# Patient Record
Sex: Female | Born: 1945 | Race: White | Hispanic: No | Smoking: Current every day smoker
Health system: Southern US, Community
[De-identification: ages and names within clinical notes are randomized; demographics above are authoritative.]

## PROBLEM LIST (undated history)

## (undated) DIAGNOSIS — R112 Nausea with vomiting, unspecified: Secondary | ICD-10-CM

## (undated) DIAGNOSIS — T4145XA Adverse effect of unspecified anesthetic, initial encounter: Secondary | ICD-10-CM

## (undated) DIAGNOSIS — F32A Depression, unspecified: Secondary | ICD-10-CM

## (undated) DIAGNOSIS — E66811 Obesity, class 1: Secondary | ICD-10-CM

## (undated) DIAGNOSIS — I2510 Atherosclerotic heart disease of native coronary artery without angina pectoris: Secondary | ICD-10-CM

## (undated) DIAGNOSIS — E119 Type 2 diabetes mellitus without complications: Secondary | ICD-10-CM

## (undated) DIAGNOSIS — R519 Headache, unspecified: Secondary | ICD-10-CM

## (undated) DIAGNOSIS — R51 Headache: Secondary | ICD-10-CM

## (undated) DIAGNOSIS — I10 Essential (primary) hypertension: Secondary | ICD-10-CM

## (undated) DIAGNOSIS — E039 Hypothyroidism, unspecified: Secondary | ICD-10-CM

## (undated) DIAGNOSIS — Z9889 Other specified postprocedural states: Secondary | ICD-10-CM

## (undated) DIAGNOSIS — F419 Anxiety disorder, unspecified: Secondary | ICD-10-CM

## (undated) DIAGNOSIS — T8859XA Other complications of anesthesia, initial encounter: Secondary | ICD-10-CM

## (undated) DIAGNOSIS — F329 Major depressive disorder, single episode, unspecified: Secondary | ICD-10-CM

## (undated) DIAGNOSIS — I739 Peripheral vascular disease, unspecified: Secondary | ICD-10-CM

## (undated) DIAGNOSIS — Z720 Tobacco use: Secondary | ICD-10-CM

## (undated) DIAGNOSIS — E785 Hyperlipidemia, unspecified: Secondary | ICD-10-CM

## (undated) DIAGNOSIS — E669 Obesity, unspecified: Secondary | ICD-10-CM

## (undated) HISTORY — PX: CHOLECYSTECTOMY OPEN: SUR202

## (undated) HISTORY — DX: Obesity, class 1: E66.811

## (undated) HISTORY — DX: Type 2 diabetes mellitus without complications: E11.9

## (undated) HISTORY — DX: Obesity, unspecified: E66.9

## (undated) HISTORY — DX: Hyperlipidemia, unspecified: E78.5

## (undated) HISTORY — DX: Tobacco use: Z72.0

## (undated) HISTORY — DX: Hypothyroidism, unspecified: E03.9

## (undated) HISTORY — DX: Peripheral vascular disease, unspecified: I73.9

## (undated) HISTORY — DX: Atherosclerotic heart disease of native coronary artery without angina pectoris: I25.10

## (undated) HISTORY — PX: DILATION AND CURETTAGE OF UTERUS: SHX78

## (undated) HISTORY — DX: Essential (primary) hypertension: I10

## (undated) HISTORY — PX: TONSILLECTOMY: SUR1361

---

## 2005-10-20 DIAGNOSIS — I2510 Atherosclerotic heart disease of native coronary artery without angina pectoris: Secondary | ICD-10-CM | POA: Insufficient documentation

## 2006-03-06 HISTORY — PX: CARDIAC CATHETERIZATION: SHX172

## 2008-03-10 ENCOUNTER — Encounter: Admission: RE | Admit: 2008-03-10 | Discharge: 2008-03-10 | Payer: Self-pay | Admitting: Obstetrics and Gynecology

## 2009-02-18 HISTORY — PX: OTHER SURGICAL HISTORY: SHX169

## 2012-01-20 HISTORY — PX: OTHER SURGICAL HISTORY: SHX169

## 2012-10-17 ENCOUNTER — Ambulatory Visit: Payer: Self-pay | Admitting: Cardiology

## 2012-10-17 ENCOUNTER — Ambulatory Visit (INDEPENDENT_AMBULATORY_CARE_PROVIDER_SITE_OTHER): Payer: BC Managed Care – PPO | Admitting: Cardiology

## 2012-10-17 ENCOUNTER — Encounter: Payer: Self-pay | Admitting: Cardiology

## 2012-10-17 VITALS — BP 114/64 | HR 63 | Ht 62.0 in | Wt 177.0 lb

## 2012-10-17 DIAGNOSIS — I2510 Atherosclerotic heart disease of native coronary artery without angina pectoris: Secondary | ICD-10-CM

## 2012-10-17 DIAGNOSIS — I739 Peripheral vascular disease, unspecified: Secondary | ICD-10-CM

## 2012-10-17 DIAGNOSIS — Z720 Tobacco use: Secondary | ICD-10-CM

## 2012-10-17 DIAGNOSIS — E66811 Obesity, class 1: Secondary | ICD-10-CM

## 2012-10-17 DIAGNOSIS — R0989 Other specified symptoms and signs involving the circulatory and respiratory systems: Secondary | ICD-10-CM

## 2012-10-17 DIAGNOSIS — F17200 Nicotine dependence, unspecified, uncomplicated: Secondary | ICD-10-CM

## 2012-10-17 DIAGNOSIS — I10 Essential (primary) hypertension: Secondary | ICD-10-CM

## 2012-10-17 DIAGNOSIS — E669 Obesity, unspecified: Secondary | ICD-10-CM

## 2012-10-17 DIAGNOSIS — E785 Hyperlipidemia, unspecified: Secondary | ICD-10-CM

## 2012-10-17 NOTE — Patient Instructions (Addendum)
Your physician has requested that you have a lower  extremity arterial duplex. This test is an ultrasound of the arteries in the legs . It looks at arterial blood flow in the legs. Allow one hour for Lower  Arterial scans. There are no restrictions or special instructions  Your physician has requested that you have a carotid duplex. This test is an ultrasound of the carotid arteries in your neck. It looks at blood flow through these arteries that supply the brain with blood. Allow one hour for this exam. There are no restrictions or special instructions.  Your physician wants you to follow-up in 6 months Dr Herbie Baltimore.  You will receive a reminder letter in the mail two months in advance. If you don't receive a letter, please call our office to schedule the follow-up appointment.

## 2012-10-20 ENCOUNTER — Encounter: Payer: Self-pay | Admitting: Cardiology

## 2012-10-20 DIAGNOSIS — E785 Hyperlipidemia, unspecified: Secondary | ICD-10-CM | POA: Insufficient documentation

## 2012-10-20 DIAGNOSIS — E669 Obesity, unspecified: Secondary | ICD-10-CM | POA: Insufficient documentation

## 2012-10-20 DIAGNOSIS — R0989 Other specified symptoms and signs involving the circulatory and respiratory systems: Secondary | ICD-10-CM | POA: Insufficient documentation

## 2012-10-20 DIAGNOSIS — I10 Essential (primary) hypertension: Secondary | ICD-10-CM | POA: Insufficient documentation

## 2012-10-20 DIAGNOSIS — Z716 Tobacco abuse counseling: Secondary | ICD-10-CM | POA: Insufficient documentation

## 2012-10-20 DIAGNOSIS — I739 Peripheral vascular disease, unspecified: Secondary | ICD-10-CM | POA: Insufficient documentation

## 2012-10-20 NOTE — Assessment & Plan Note (Addendum)
I talked to her again about finally quitting smoking for about 4 to 5 minutes.  I told her that she can try Nicoderm patches, Nicorette gum, or even the E- cigarettes.  I told her that it really isn't the nicotine that is my concern for now, it is the burning chemicals.  She laughed.  I am not sure I she will try or not.

## 2012-10-20 NOTE — Assessment & Plan Note (Signed)
Simply because of her coronary disease in lower extremity arterial disease, affected there may be a bruit there warrants evaluation for carotid Dopplers.  Plan: Carotid Dopplers to be performed at time of LEA Dopplers.

## 2012-10-20 NOTE — Assessment & Plan Note (Signed)
She is known bilateral extremity PAD. It is occlusive, and she does have claudication but is not lifestyle limiting. She has able to walk through it. She probably is pretty good collateralization. No signs of critical limb ischemia  Plan: Continue aggressive risk factor modification including continued smoking cessation counseling, diabetes, blood pressure and lipid control. Monitor for signs of nonhealing wounds or lesions. Repeat LEA Dopplers/ABI in November -- review the results with Dr. Nanetta Batty to determine if the ABI levels and the velocities are significant enough to warrant invasive evaluation despite progressively worsening symptoms.

## 2012-10-20 NOTE — Assessment & Plan Note (Signed)
I counseled her on the importance of trying to get back out and getting exercise. She is to motivate her soap to do this. She also needs to watch her dietary intake and ensure that she decrease her carbohydrate and fatty food intake while increasing fruits and vegetables.

## 2012-10-20 NOTE — Progress Notes (Signed)
Patient ID: Katherine Martin, female   DOB: 02/16/1946, 67 y.o.   MRN: [id number]  PCP: Katherine Sites, MD  Clinic Note: Chief Complaint  Patient presents with  . 6 months    no complaints   HPI: Katherine Martin is a 67 y.o. female with a PMH below who presents today for followup of her coronary artery disease and peripheral vascular disease.  Her last visit with me was in February of this year, and she remained stable..  Interval History: [name] continues to do pretty much her baseline level of "feeling okay. "She denies any chest pain or shortness of breath with any regular activity. She does say that she's been a bit more active with walking of late. When she does walk a lot she will she does get some pain in her legs but it goes away if he is going, and is not limiting. She denies any PND, orthopnea or edema. She may have occasional low lightheadedness or vertigo type symptoms but she is always had nothing significant. No palpitations or rapid heart rates. No syncope or near-syncope. No TIA or amaurosis fugax symptoms. She denies any melena, hematochezia or hematuria.  She states that she is currently following up with a urologist for the concern for Actos related bladder cancer. This has her concerned. She has a rash is mostly noted on her left leg more so than her right. It doesn't really H. not really significant. There is thought that maybe he has to do with her diabetes. That's also been evaluated. She denies any sores or lesions on her feet. Any rashes or wounds that she hasn't be healed without problem. She denies any peripheral neuropathy type feelings in the legs or feet   Past Medical History  Diagnosis Date  . Coronary artery disease, occlusive     cath 2007  - RCA 100%, Severe distal Circumflex  . PVD (peripheral vascular disease)     LAA Dopplers, November 2013: ABIs--0.67 right, 0.65 left bilateral common iliacs less than 50% stenosis, bilateral SFA demonstrate exclusive  disease with reconstitution in the proximal segment of bronchial arteries.) He artery occluded with 2 vessel runoff on the right, 3 vessel run off on left.  . Hypertension   . Type 2 diabetes mellitus   . Dyslipidemia   . Hypothyroidism   . Tobacco use   . Obesity (BMI 30.0-34.9)     Prior Cardiac Evaluation and Past Surgical History: Past Surgical History  Procedure Laterality Date  . Cardiac catheterization  2007    100% RCA, Severe distal Circumflex stenosis, moderate LAD; EF ~60% with mild anterior hypokinesis.  Marland Kitchen [employer] - treadmill  December 2010    Walk 6 minutes. No ischemia or infarction.    Allergies  Allergen Reactions  . Buspar (Buspirone)   . Clindamycin/Lincomycin Swelling    Tongue   . Demerol (Meperidine) Hives  . Effexor (Venlafaxine)   . Erythromycin Hives  . Levoxyl (Levothyroxine)   . Other     peaches  . Penicillins Hives  . Prednisone Hives  . Sular (Nisoldipine Er)   . Zithromax (Azithromycin)   . Zoloft (Sertraline Hcl)     Current Outpatient Prescriptions  Medication Sig Dispense Refill  . ACTOS 15 MG tablet Take 1 tablet by mouth daily.      Marland Kitchen aspirin 81 MG tablet Take 81 mg by mouth daily.      Marland Kitchen atenolol-chlorthalidone (TENORETIC) 50-25 MG per tablet Take 0.5 tablets by mouth daily.      Marland Kitchen  clonazePAM (KLONOPIN) 0.5 MG tablet Take 5 mg by mouth as needed. Takes very rarely      . DIOVAN 160 MG tablet Take 1 tablet by mouth daily.      Marland Kitchen PAXIL CR 25 MG 24 hr tablet Take 50 mg by mouth daily.      . rosuvastatin (CRESTOR) 10 MG tablet Take 10 mg by mouth daily.      Marland Kitchen SYNTHROID 125 MCG tablet Take 1 tablet by mouth daily.       No current facility-administered medications for this visit.    Social History Narrative   She is a widowed mother of one. She continues to smoke a pack a day, as she has for about 40 years. She is trying to pick up her exercise level but does not really do much with exercise. She says she is basically lazy     ROS: A comprehensive Review of Systems - Negative except Pertinent positives noted above.  PHYSICAL EXAM BP 114/64  Pulse 63  Ht 5\' 2"  (1.575 m)  Wt 177 lb (80.287 kg)  BMI 32.37 kg/m2 General appearance: alert, cooperative, appears stated age, no distress, mildly obese and Otherwise healthy-appearing. She states as usual this somewhat blunted in affect. Neck: no adenopathy, no JVD, supple, symmetrical, trachea midline, thyroid not enlarged, symmetric, no tenderness/mass/nodules and Cannot exclude faint right carotid bruit Lungs: clear to auscultation bilaterally, normal percussion bilaterally and Nonlabored, good air movement no W./R./R. Heart: regular rate and rhythm, S1, S2 normal, no murmur, click, rub or gallop and normal apical impulse Abdomen: soft, non-tender; bowel sounds normal; no masses,  no organomegaly and Truncal obesity; soft abdominal and bilateral femoral bruits Extremities: extremities normal, atraumatic, no cyanosis or edema Pulses: 2+ bilateral radial pulses with 1+ bilateral lower extremity DP/PT pulses Skin: Skin color, texture, turgor normal. No rashes or lesions or There appears to be a macular or even almost this with petechial rash is gas scattered and diffuse on the left ankle and less pronounced on the right. It does not have the appearance of venous stasis dermatitis. Neurologic: Grossly normal  [id number] today: Yes Rate: 63 , Rhythm: Normal sinus rhythm, normal ECG;    Recent Labs: None  ASSESSMENT / PLAN:  CAD (coronary artery disease) No active cardiac symptoms of chest pain or shortness of breath at rest or exertion. No heart failure symptoms.  Plan: Continue beta blocker, ARB, aspirin and Crestor.  Discussed symptoms that would be notable for angina including chest tightness or pressure with rest or exertion as well as short of breath at rest exertion is new. Unless symptoms occur, would not repeat stress test, unless preop for peripheral  vascular procedure.  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 She is known bilateral extremity PAD. It is occlusive, and she does have claudication but is not lifestyle limiting. She has able to walk through it. She probably is pretty good collateralization. No signs of critical limb ischemia  Plan: Continue aggressive risk factor modification including continued smoking cessation counseling, diabetes, blood pressure and lipid control. Monitor for signs of nonhealing wounds or lesions. Repeat LEA Dopplers/ABI in November -- review the results with Dr. Nanetta Batty to determine if the ABI levels and the velocities are significant enough to warrant invasive evaluation despite progressively worsening symptoms.  Carotid bruit Simply because of her coronary disease in lower extremity arterial disease, affected there may be a bruit there warrants evaluation for carotid Dopplers.  Plan: Carotid Dopplers to be performed at time  of LEA Dopplers.  Dyslipidemia She is taking Crestor.  Followed by PCP.  Do not have current results.  Hypertension Very well controlled on current medications. No need to change.  Tobacco abuse counseling I talked to her again about finally quitting smoking for about 4 to 5 minutes.  I told her that she can try Nicoderm patches, Nicorette gum, or even the E- cigarettes.  I told her that it really isn't the nicotine that is my concern for now, it is the burning chemicals.  She laughed.  I am not sure I she will try or not.  Obesity (BMI 30.0-34.9) I counseled her on the importance of trying to get back out and getting exercise. She is to motivate her soap to do this. She also needs to watch her dietary intake and ensure that she decrease her carbohydrate and fatty food intake while increasing fruits and vegetables.   Orders Placed This Encounter  Procedures  . EKG 12-Lead  . Lower Extremity Arterial Duplex Bilateral    claudication    Standing Status:  Future     Number of Occurrences:      Standing Expiration Date: 01/17/2013    Order Specific Question:  Laterality    Answer:  Bilateral    Order Specific Question:  Where should this test be performed:    Answer:  [facility]  . Doppler carotid    Dx bruit    Standing Status: Future     Number of Occurrences:      Standing Expiration Date: 01/17/2013    Order Specific Question:  Laterality    Answer:  Bilateral    Order Specific Question:  Where should this test be performed:    Answer:  [facility]   35 minutes patient. 15 minutes with chart  Followup: 6 months  DAVID W. Herbie Baltimore, M.D., M.S. [facility] [address] [place], [place]  [address]  [phone] Pager # [phone]

## 2012-10-20 NOTE — Assessment & Plan Note (Addendum)
No active cardiac symptoms of chest pain or shortness of breath at rest or exertion. No heart failure symptoms.  Plan: Continue beta blocker, ARB, aspirin and Crestor.  Discussed symptoms that would be notable for angina including chest tightness or pressure with rest or exertion as well as short of breath at rest exertion is new. Unless symptoms occur, would not repeat stress test, unless preop for peripheral vascular procedure.

## 2012-10-20 NOTE — Assessment & Plan Note (Signed)
Very well controlled on current medications. No need to change.

## 2012-10-20 NOTE — Assessment & Plan Note (Signed)
She is taking Crestor.  Followed by PCP.  Do not have current results.

## 2012-11-19 HISTORY — PX: OTHER SURGICAL HISTORY: SHX169

## 2012-12-03 ENCOUNTER — Ambulatory Visit (HOSPITAL_COMMUNITY)
Admission: RE | Admit: 2012-12-03 | Discharge: 2012-12-03 | Disposition: A | Discharge status: Home / Self Care | Payer: Medicare Other | Source: Ambulatory Visit | Attending: Cardiovascular Disease | Admitting: Cardiovascular Disease

## 2012-12-03 DIAGNOSIS — R0989 Other specified symptoms and signs involving the circulatory and respiratory systems: Secondary | ICD-10-CM

## 2012-12-03 DIAGNOSIS — I2510 Atherosclerotic heart disease of native coronary artery without angina pectoris: Secondary | ICD-10-CM

## 2012-12-03 DIAGNOSIS — I739 Peripheral vascular disease, unspecified: Secondary | ICD-10-CM

## 2012-12-03 NOTE — Progress Notes (Signed)
Carotid Duplex Completed. Yulonda Wheeling, BS, RDMS, RVT  

## 2012-12-13 ENCOUNTER — Ambulatory Visit (HOSPITAL_COMMUNITY)
Admission: RE | Admit: 2012-12-13 | Discharge: 2012-12-13 | Disposition: A | Discharge status: Home / Self Care | Payer: Medicare Other | Source: Ambulatory Visit | Attending: Cardiovascular Disease | Admitting: Cardiovascular Disease

## 2012-12-13 DIAGNOSIS — I2510 Atherosclerotic heart disease of native coronary artery without angina pectoris: Secondary | ICD-10-CM

## 2012-12-13 DIAGNOSIS — I739 Peripheral vascular disease, unspecified: Secondary | ICD-10-CM

## 2012-12-13 DIAGNOSIS — R0989 Other specified symptoms and signs involving the circulatory and respiratory systems: Secondary | ICD-10-CM

## 2012-12-13 NOTE — Progress Notes (Signed)
Lower Extremity Arterial Duplex Completed. °Brianna L Mazza,RVT °

## 2012-12-19 ENCOUNTER — Telehealth: Payer: Self-pay | Admitting: Cardiology

## 2012-12-19 NOTE — Telephone Encounter (Signed)
Doppler results given and appt made

## 2012-12-19 NOTE — Telephone Encounter (Signed)
Returning your call. °

## 2012-12-24 ENCOUNTER — Encounter: Payer: Self-pay | Admitting: Cardiovascular Disease

## 2012-12-24 ENCOUNTER — Ambulatory Visit (INDEPENDENT_AMBULATORY_CARE_PROVIDER_SITE_OTHER): Payer: Medicare Other | Admitting: Cardiovascular Disease

## 2012-12-24 VITALS — BP 150/70 | HR 60 | Ht 62.0 in | Wt 176.3 lb

## 2012-12-24 DIAGNOSIS — I739 Peripheral vascular disease, unspecified: Secondary | ICD-10-CM

## 2012-12-24 DIAGNOSIS — D689 Coagulation defect, unspecified: Secondary | ICD-10-CM

## 2012-12-24 DIAGNOSIS — Z01810 Encounter for preprocedural cardiovascular examination: Secondary | ICD-10-CM

## 2012-12-24 NOTE — Progress Notes (Signed)
12/24/2012 Katherine Martin   Aug 05, 1945  [id number]  Primary Physician Michiel Sites, MD Primary Cardiologist: Runell Gess MD Roseanne Reno   HPI:  Katherine Martin is a 67 year old moderately overweight widowed Caucasian female patient of Dr. Alexis Goodell referred for peripheral vascular evaluation. She has a history of coronary artery disease status post cath by Dr. Charolette Child in 2007 revealing 2 vessel disease. Other problems include continued tobacco abuse, hypertension, hypovolemia and type 2 diabetes. She does have a lifestyle limiting claudication with Dopplers to show ABIs of 0.5 range and occluded SFAs bilaterally.   Current Outpatient Prescriptions  Medication Sig Dispense Refill  . ACTOS 15 MG tablet Take 1 tablet by mouth daily.      Marland Kitchen aspirin 81 MG tablet Take 81 mg by mouth daily.      Marland Kitchen atenolol-chlorthalidone (TENORETIC) 50-25 MG per tablet Take 0.5 tablets by mouth daily.      . clonazePAM (KLONOPIN) 0.5 MG tablet Take 0.25 mg by mouth as needed. Takes very rarely      . PAXIL CR 25 MG 24 hr tablet Take 50 mg by mouth daily.      . rosuvastatin (CRESTOR) 10 MG tablet Take 10 mg by mouth daily.      Marland Kitchen SYNTHROID 125 MCG tablet Take 1 tablet by mouth daily.      . valsartan (DIOVAN) 160 MG tablet Take 160 mg by mouth daily.       No current facility-administered medications for this visit.    Allergies  Allergen Reactions  . Buspar [Buspirone]   . Clindamycin/Lincomycin Swelling    Tongue   . Demerol [Meperidine] Hives  . Effexor [Venlafaxine]   . Erythromycin Hives  . Levoxyl [Levothyroxine]   . Other Hives    peaches  . Penicillins Hives  . Prednisone Hives  . Sular [Nisoldipine Er]   . Zithromax [Azithromycin]   . Zoloft [Sertraline Hcl]     History   Social History  . Marital Status: Unknown    Spouse Name: N/A    Number of Children: N/A  . Years of Education: N/A   Occupational History  . Not on file.   Social History  Main Topics  . Smoking status: Current Every Day Smoker -- 1.00 packs/day for 40 years    Types: Cigarettes  . Smokeless tobacco: Current User     Comment: She is trying the electronic cigarette.  . Alcohol Use: No  . Drug Use: No  . Sexual Activity: Not on file   Other Topics Concern  . Not on file   Social History Narrative   She is a widowed mother of one. She continues to smoke a pack a day, as she has for about 40 years. She is trying to pick up her exercise level but does not really do much with exercise. She says she is basically lazy     Review of Systems: General: negative for chills, fever, night sweats or weight changes.  Cardiovascular: negative for chest pain, dyspnea on exertion, edema, orthopnea, palpitations, paroxysmal nocturnal dyspnea or shortness of breath Dermatological: negative for rash Respiratory: negative for cough or wheezing Urologic: negative for hematuria Abdominal: negative for nausea, vomiting, diarrhea, bright red blood per rectum, melena, or hematemesis Neurologic: negative for visual changes, syncope, or dizziness All other systems reviewed and are otherwise negative except as noted above.    Blood pressure 150/70, pulse 60, height 5\' 2"  (1.575 m), weight 176 lb 4.8 oz (79.969 kg).  General appearance: alert and no distress Neck: no adenopathy, no carotid bruit, no JVD, supple, symmetrical, trachea midline and thyroid not enlarged, symmetric, no tenderness/mass/nodules Lungs: clear to auscultation bilaterally Heart: regular rate and rhythm, S1, S2 normal, no murmur, click, rub or gallop Extremities: extremities normal, atraumatic, no cyanosis or edema and absent pedal pulses  EKG not performed today  ASSESSMENT AND PLAN:   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 The patient has lifestyle limiting  claudication. Recent lower extremity Dopplers performed 12/13/12 revealed ABIs in the 0.5 range with occluded SFAs bilaterally. She also  has mild internal carotid artery stenosis on the left. She is intolerant to Pletal.plan will be to perform an outpatient abdominal aortography with bifemoral runoff via left femoral approach a potential endovascular therapy. I thoroughly explained procedure the patient.      Runell Gess MD FACP,FACC,FAHA, [facility] 12/24/2012 3:27 PM

## 2012-12-24 NOTE — Assessment & Plan Note (Signed)
The patient has lifestyle limiting  claudication. Recent lower extremity Dopplers performed 12/13/12 revealed ABIs in the 0.5 range with occluded SFAs bilaterally. She also has mild internal carotid artery stenosis on the left. She is intolerant to Pletal.plan will be to perform an outpatient abdominal aortography with bifemoral runoff via left femoral approach a potential endovascular therapy. I thoroughly explained procedure the patient.

## 2012-12-24 NOTE — Patient Instructions (Addendum)
SCHEDULE FOR PERIPHERAL ANGIOGRAM- left groin access WITH Scott and Delice Bison Your physician has requested that you have a peripheral vascular angiogram. This exam is performed at the hospital. During this exam IV contrast is used to look at arterial blood flow. Please review the information sheet given for details.  Labs-BMP,CBC,PT,PTT CHEST XRAY   Your physician recommends that you schedule a follow-up appointment in after your angiogram

## 2012-12-25 NOTE — Progress Notes (Signed)
Thanks -- look forward to seeing results.  Marykay Lex, MD

## 2013-01-01 ENCOUNTER — Other Ambulatory Visit: Payer: Self-pay | Admitting: *Deleted

## 2013-01-01 DIAGNOSIS — Z01818 Encounter for other preprocedural examination: Secondary | ICD-10-CM

## 2013-01-07 ENCOUNTER — Telehealth: Payer: Self-pay | Admitting: Cardiovascular Disease

## 2013-01-07 NOTE — Telephone Encounter (Signed)
Returned call.  Pt stated she cannot have the procedure Dr. Allyson Sabal scheduled done until the beginning of next year.  RN asked if it was r/t insurance and pt stated it was a Personnel officer that she needed to reschedule.  Pt would like to cancel appt for 11.4.14 and have rescheduled to January 2015.  Katherine Martin in scheduling notified and advised schedule not available that far out yet.  Katherine Martin, scheduling, be notified.  Pt informed of this and will await call from scheduling to reschedule procedure.  Pt understands she may need to see Dr. Allyson Sabal again (at least 30 days) before procedure rescheduled.    Message forwarded to Dr. Allyson Sabal [facility]) Message forwarded to [facility], scheduling, to reschedule when available.

## 2013-01-07 NOTE — Telephone Encounter (Signed)
Calling to advise unable to do procedure scheduled for 11/4 with Dr Allyson Sabal Please Call

## 2013-01-22 ENCOUNTER — Ambulatory Visit (HOSPITAL_COMMUNITY): Admit: 2013-01-22 | Payer: Medicare Other | Admitting: Cardiovascular Disease

## 2013-01-22 ENCOUNTER — Encounter (HOSPITAL_COMMUNITY): Payer: Self-pay

## 2013-01-22 SURGERY — ANGIOGRAM, LOWER EXTREMITY
Anesthesia: LOCAL

## 2013-02-26 ENCOUNTER — Telehealth: Payer: Self-pay | Admitting: Cardiovascular Disease

## 2013-02-26 NOTE — Telephone Encounter (Signed)
I spoke with patient today 02/26/13 to schedule pv procedure for Januray 2015.  Patient states that she wants to wait until March 2015 and will call me the first part of February 2015.

## 2013-02-27 NOTE — Telephone Encounter (Signed)
noted 

## 2013-05-23 ENCOUNTER — Ambulatory Visit: Payer: Medicare Other | Admitting: Cardiology

## 2013-05-29 ENCOUNTER — Other Ambulatory Visit: Payer: Self-pay | Admitting: Endocrinology

## 2013-05-29 DIAGNOSIS — R911 Solitary pulmonary nodule: Secondary | ICD-10-CM

## 2013-06-03 ENCOUNTER — Ambulatory Visit
Admission: RE | Admit: 2013-06-03 | Discharge: 2013-06-03 | Disposition: A | Discharge status: Home / Self Care | Payer: Medicare Other | Source: Ambulatory Visit | Attending: Endocrinology | Admitting: Endocrinology

## 2013-06-03 DIAGNOSIS — R911 Solitary pulmonary nodule: Secondary | ICD-10-CM

## 2013-06-20 ENCOUNTER — Encounter: Payer: Self-pay | Admitting: *Deleted

## 2013-06-24 ENCOUNTER — Ambulatory Visit (INDEPENDENT_AMBULATORY_CARE_PROVIDER_SITE_OTHER): Payer: Medicare Other | Admitting: Cardiology

## 2013-06-24 ENCOUNTER — Encounter: Payer: Self-pay | Admitting: Cardiology

## 2013-06-24 VITALS — BP 162/64 | HR 76 | Ht 62.0 in | Wt 182.8 lb

## 2013-06-24 DIAGNOSIS — Z7189 Other specified counseling: Secondary | ICD-10-CM

## 2013-06-24 DIAGNOSIS — F17200 Nicotine dependence, unspecified, uncomplicated: Secondary | ICD-10-CM

## 2013-06-24 DIAGNOSIS — I739 Peripheral vascular disease, unspecified: Secondary | ICD-10-CM

## 2013-06-24 DIAGNOSIS — I2510 Atherosclerotic heart disease of native coronary artery without angina pectoris: Secondary | ICD-10-CM

## 2013-06-24 DIAGNOSIS — Z716 Tobacco abuse counseling: Secondary | ICD-10-CM

## 2013-06-24 DIAGNOSIS — E669 Obesity, unspecified: Secondary | ICD-10-CM

## 2013-06-24 DIAGNOSIS — R0989 Other specified symptoms and signs involving the circulatory and respiratory systems: Secondary | ICD-10-CM

## 2013-06-24 DIAGNOSIS — I10 Essential (primary) hypertension: Secondary | ICD-10-CM

## 2013-06-24 DIAGNOSIS — E785 Hyperlipidemia, unspecified: Secondary | ICD-10-CM

## 2013-06-24 NOTE — Patient Instructions (Signed)
Your physician recommends that you schedule a follow-up appointment in 2 months to see Dr Allyson SabalBerry - discuss PV angiogram   Your physician wants you to follow-up in 12 months DR Herbie BaltimoreHarding.  You will receive a reminder letter in the mail two months in advance. If you don't receive a letter, please call our office to schedule the follow-up appointment.

## 2013-06-25 ENCOUNTER — Encounter: Payer: Self-pay | Admitting: Cardiology

## 2013-06-25 NOTE — Assessment & Plan Note (Signed)
Discussed dietary modification with increased fruits and vegetables. Also the importance of getting back into walking and exercising. It is for this reason I think she needs to be aggressively managed from a claudication standpoint to allow her to "rehabilitate herself "

## 2013-06-25 NOTE — Progress Notes (Signed)
PCP: Michiel Sites, MD  Clinic Note: Chief Complaint  Patient presents with  . ROV 6 months    C/o leg pain, otherwise, no complaints.   HPI: Katherine Martin is a 68 y.o. female with a Cardiovascular Problem List below who presents today for delayed six-month followup. In the interim so I saw her, she did have repeat Lower Extremity Arterial Dopplers done, and was referred to Dr. Allyson Sabal in October. She had scheduled peripheral vascular angiography, but she got scared by the potential procedure and canceled her appointment. She is also had a chest CT to followup a spot on her morning.   Interval History:  She continues to do relatively fine from a cardiac standpoint.  She denies any chest tightness/pressure (angina) or dyspnea with rest or exertion. Certainly if she pushes herself a lot she will get short of breath but is usually more limited by her leg discomfort. She continues to smoke and has diabetes. She has claudication symptoms that limit her amount of walking. She can still continue to walk through the pain but is still unable to walk a reasonable distance. She denies any rapid or irregular heartbeats except for when she changed around some medications in the past. But since then nothing significant. No lightheadedness, dizziness, weakness or syncope/near-syncope. No TIA or amaurosis fugax symptoms. No melena, hematochezia or hematuria. No epistaxis.  Past Medical History  Diagnosis Date  . Coronary artery disease, occlusive     cath 2007  - RCA 100%, Severe distal Circumflex  . PVD (peripheral vascular disease)     LAA Dopplers, November 2013: ABIs--0.67 right, 0.65 left bilateral common iliacs less than 50% stenosis, bilateral SFA demonstrate exclusive disease with reconstitution in the proximal segment of bronchial arteries.) He artery occluded with 2 vessel runoff on the right, 3 vessel run off on left.  . Hypertension   . Type 2 diabetes mellitus   . Dyslipidemia   .  Hypothyroidism   . Tobacco use   . Obesity (BMI 30.0-34.9)     Prior Cardiac Evaluation and Past Surgical History: Past Surgical History  Procedure Laterality Date  . Cardiac catheterization  03/06/2006    100% RCA, Severe distal Circumflex stenosis, moderate LAD; EF ~60% with mild anterior hypokinesis. (Dr. Richarda Blade)  . Nm myoview ltd - treadmill  02/2009    Walk 6 minutes. No ischemia or infarction.  . Lower extremity arterial doppler  01/2012    bilateral ABI demonstrate moderate arterial insuff at rest; abd aorta with non-hemodynamically significant plaque; bilat CIA with equal/less than 50% diameter reduction; bilat SFA with occlusive disease & reconstiution in prox popliteal; R peroneal not visualized  . Lower extremity arterial doppler  September 2014    RABI: 0.55; LABI 0.53; moderate CEA, bilateral SFA-occlusive with reconstitution and proximal Popliteal; bilateral Peroneal artery occlusion.   MEDICATIONS AND ALLERGIES REVIEWED IN EPIC No Change in Social and Family History actually smoking more now than she had been secondary to social stressors  ROS: A comprehensive Review of Systems - Negative except Generalized malaise and fatigue, lack of will power to do things. Otherwise negative with the exception of symptoms as noted above.  PHYSICAL EXAM BP 162/64  Pulse 76  Ht 5\' 2"  (1.575 m)  Wt 182 lb 12.8 oz (82.918 kg)  BMI 33.43 kg/m2 General appearance: alert, cooperative, appears stated age, no distress and mildly  obese Neck: no adenopathy, no carotid bruit and no JVD Lungs: clear to auscultation bilaterally, normal percussion bilaterally and non-labored  Heart: regular rate and rhythm, S1, S2 normal, no murmur, click, rub or gallop; nondisplaced PMI  Abdomen: soft, non-tender; bowel sounds normal; no masses,  no organomegaly; truncal obesity. Ex bilateral femoral bruits. tremities: extremities normal, atraumatic, no cyanosis, or edema  Pulses: 2+ and symmetric radial,  but significantly ra littleeduced trace to 1+ bilateral  DP and PT pulses. Neurologic: Mental status: Alert, oriented, thought content appropriate; Cranial nerves: normal (II-XII grossly intact)   Adult ECG Report  Rate:  76 ;  Rhythm: normal sinus rhythm, sinus arrhythmia and premature ventricular contractions (PVC)  QRS Axis, PR Interval, QRS Duration, and QTc:  Normal  Narrative Interpretation:  Otherwise normal EKG. No significant change.  Recent Labs  from PCP 05/21/2013:   CBC: 9.6; 13.8/42.1; 219.;;; Hemoglobin A1c: 6.7  CMP: BUN/creatinine 21/0.79. Glucose 99. Potassium 4.5. LFTs normal  Lipid Panel: TC 185, HDL 47, LDL 90, TG 238  ASSESSMENT / PLAN: CAD (coronary artery disease) No active cardiac symptoms Of angina or heart failure.  Plan: Continue combination beta blocker, ARB statin and aspirin.  Would only repeat stress test if symptoms of angina were to recur.   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 Claudication that is relatively limiting her lifestyle standpoint. Her ABIs were concerning during last visit. She is more receptive to the concept of having invasive evaluation. I spent some time explaining to her the difference between peripheral angiography, intervention versus vascular bypass surgery. I think these are some of her fears.  Plan: Refer back to Dr. Allyson SabalBerry for reassessment for possible PV angiography/minus CTO crossing PTA/stenting  Hypertension Previously been relatively well-controlled. She seems a little uptight today. We'll see what her blood pressure looks like cc Dr. Allyson SabalBerry. If still elevated would potentially need to increase her Diovan dose. She's been on Tenoretic for quite some time. In the future would like to switch from atenolol to carvedilol.  Tobacco abuse counseling  5 minutes in direct counseling as to the importance of smoking cessation, options for smoking cessation including the [place] Quit-Line.  She does not seem to be at all  interested in the concept of sensation. Non-contemplative state  Obesity (BMI 30.0-34.9) Discussed dietary modification with increased fruits and vegetables. Also the importance of getting back into walking and exercising. It is for this reason I think she needs to be aggressively managed from a claudication standpoint to allow her to "rehabilitate herself "  Dyslipidemia She is taking Crestor, however I just received the labs after seeing her having not had them for several visits. Her LDL is not yet at goal. My recommendation would be to increase Crestor to 20 mg daily and consider in the future the possibility of Zetia.    Orders Placed This Encounter  Procedures  . EKG 12-Lead   No orders of the defined types were placed in this encounter.   Referred to Dr. Nanetta BattyJonathan Berry for peripheral vascular angiography and possible PTA.  Followup:  12 months  Harden Bramer W. Herbie BaltimoreHARDING, M.D., M.S. Interventional Cardiologist [facility]

## 2013-06-25 NOTE — Assessment & Plan Note (Signed)
Claudication that is relatively limiting her lifestyle standpoint. Her ABIs were concerning during last visit. She is more receptive to the concept of having invasive evaluation. I spent some time explaining to her the difference between peripheral angiography, intervention versus vascular bypass surgery. I think these are some of her fears.  Plan: Refer back to Dr. Allyson SabalBerry for reassessment for possible PV angiography/minus CTO crossing PTA/stenting

## 2013-06-25 NOTE — Assessment & Plan Note (Signed)
5 minutes in direct counseling as to the importance of smoking cessation, options for smoking cessation including the [place] Quit-Line.  She does not seem to be at all interested in the concept of sensation. Non-contemplative state

## 2013-06-25 NOTE — Assessment & Plan Note (Signed)
Previously been relatively well-controlled. She seems a little uptight today. We'll see what her blood pressure looks like cc Dr. Allyson SabalBerry. If still elevated would potentially need to increase her Diovan dose. She's been on Tenoretic for quite some time. In the future would like to switch from atenolol to carvedilol.

## 2013-06-25 NOTE — Assessment & Plan Note (Addendum)
No active cardiac symptoms Of angina or heart failure.  Plan: Continue combination beta blocker, ARB statin and aspirin.  Would only repeat stress test if symptoms of angina were to recur.

## 2013-06-25 NOTE — Assessment & Plan Note (Signed)
She is taking Crestor, however I just received the labs after seeing her having not had them for several visits. Her LDL is not yet at goal. My recommendation would be to increase Crestor to 20 mg daily and consider in the future the possibility of Zetia.

## 2013-09-03 ENCOUNTER — Ambulatory Visit: Payer: Medicare Other | Admitting: Cardiovascular Disease

## 2014-05-17 ENCOUNTER — Emergency Department (HOSPITAL_COMMUNITY): Payer: Medicare Other

## 2014-05-17 ENCOUNTER — Encounter (HOSPITAL_COMMUNITY): Payer: Self-pay | Admitting: Internal Medicine

## 2014-05-17 ENCOUNTER — Observation Stay (HOSPITAL_COMMUNITY)
Admission: EM | Admit: 2014-05-17 | Discharge: 2014-05-19 | Disposition: A | Discharge status: Home / Self Care | Payer: Medicare Other | Attending: Family Medicine | Admitting: Family Medicine

## 2014-05-17 DIAGNOSIS — I2510 Atherosclerotic heart disease of native coronary artery without angina pectoris: Secondary | ICD-10-CM | POA: Diagnosis present

## 2014-05-17 DIAGNOSIS — I2582 Chronic total occlusion of coronary artery: Secondary | ICD-10-CM | POA: Insufficient documentation

## 2014-05-17 DIAGNOSIS — E039 Hypothyroidism, unspecified: Secondary | ICD-10-CM | POA: Insufficient documentation

## 2014-05-17 DIAGNOSIS — E871 Hypo-osmolality and hyponatremia: Secondary | ICD-10-CM | POA: Insufficient documentation

## 2014-05-17 DIAGNOSIS — R200 Anesthesia of skin: Secondary | ICD-10-CM

## 2014-05-17 DIAGNOSIS — R29810 Facial weakness: Secondary | ICD-10-CM | POA: Diagnosis present

## 2014-05-17 DIAGNOSIS — G510 Bell's palsy: Secondary | ICD-10-CM | POA: Diagnosis not present

## 2014-05-17 DIAGNOSIS — E785 Hyperlipidemia, unspecified: Secondary | ICD-10-CM | POA: Insufficient documentation

## 2014-05-17 DIAGNOSIS — I739 Peripheral vascular disease, unspecified: Secondary | ICD-10-CM | POA: Diagnosis not present

## 2014-05-17 DIAGNOSIS — I10 Essential (primary) hypertension: Secondary | ICD-10-CM | POA: Diagnosis present

## 2014-05-17 DIAGNOSIS — E119 Type 2 diabetes mellitus without complications: Secondary | ICD-10-CM | POA: Diagnosis not present

## 2014-05-17 DIAGNOSIS — F17210 Nicotine dependence, cigarettes, uncomplicated: Secondary | ICD-10-CM | POA: Diagnosis not present

## 2014-05-17 DIAGNOSIS — Z6832 Body mass index (BMI) 32.0-32.9, adult: Secondary | ICD-10-CM | POA: Diagnosis not present

## 2014-05-17 DIAGNOSIS — E669 Obesity, unspecified: Secondary | ICD-10-CM | POA: Diagnosis not present

## 2014-05-17 DIAGNOSIS — R208 Other disturbances of skin sensation: Secondary | ICD-10-CM

## 2014-05-17 DIAGNOSIS — Z716 Tobacco abuse counseling: Secondary | ICD-10-CM

## 2014-05-17 DIAGNOSIS — G459 Transient cerebral ischemic attack, unspecified: Secondary | ICD-10-CM | POA: Diagnosis present

## 2014-05-17 LAB — BASIC METABOLIC PANEL
Anion gap: 8 (ref 5–15)
BUN: 17 mg/dL (ref 6–23)
CHLORIDE: 99 mmol/L (ref 96–112)
CO2: 25 mmol/L (ref 19–32)
CREATININE: 0.87 mg/dL (ref 0.50–1.10)
Calcium: 9 mg/dL (ref 8.4–10.5)
GFR calc Af Amer: 79 mL/min — ABNORMAL LOW (ref >90)
GFR calc non Af Amer: 68 mL/min — ABNORMAL LOW (ref >90)
GLUCOSE: 141 mg/dL — AB (ref 70–99)
Potassium: 3.9 mmol/L (ref 3.5–5.1)
SODIUM: 132 mmol/L — AB (ref 135–145)

## 2014-05-17 LAB — TROPONIN I: Troponin I: <0.03 ng/mL (ref <0.031)

## 2014-05-17 LAB — I-STAT CHEM 8, ED
BUN: 16 mg/dL (ref 6–23)
Calcium, Ion: 0.98 mmol/L — ABNORMAL LOW (ref 1.13–1.30)
Chloride: 100 mmol/L (ref 96–112)
Creatinine, Ser: 0.8 mg/dL (ref 0.50–1.10)
GLUCOSE: 145 mg/dL — AB (ref 70–99)
HEMATOCRIT: 42 % (ref 36.0–46.0)
Hemoglobin: 14.3 g/dL (ref 12.0–15.0)
Potassium: 3.9 mmol/L (ref 3.5–5.1)
Sodium: 133 mmol/L — ABNORMAL LOW (ref 135–145)
TCO2: 20 mmol/L (ref 0–100)

## 2014-05-17 LAB — GLUCOSE, CAPILLARY: GLUCOSE-CAPILLARY: 138 mg/dL — AB (ref 70–99)

## 2014-05-17 LAB — CBC WITH DIFFERENTIAL/PLATELET
BASOS ABS: 0 10*3/uL (ref 0.0–0.1)
Basophils Relative: 0 % (ref 0–1)
Eosinophils Absolute: 0.2 10*3/uL (ref 0.0–0.7)
Eosinophils Relative: 2 % (ref 0–5)
HEMATOCRIT: 40.6 % (ref 36.0–46.0)
Hemoglobin: 13.5 g/dL (ref 12.0–15.0)
Lymphocytes Relative: 11 % — ABNORMAL LOW (ref 12–46)
Lymphs Abs: 1.1 10*3/uL (ref 0.7–4.0)
MCH: 31.3 pg (ref 26.0–34.0)
MCHC: 33.3 g/dL (ref 30.0–36.0)
MCV: 94.2 fL (ref 78.0–100.0)
Monocytes Absolute: 0.5 10*3/uL (ref 0.1–1.0)
Monocytes Relative: 6 % (ref 3–12)
NEUTROS PCT: 81 % — AB (ref 43–77)
Neutro Abs: 7.9 10*3/uL — ABNORMAL HIGH (ref 1.7–7.7)
PLATELETS: 242 10*3/uL (ref 150–400)
RBC: 4.31 MIL/uL (ref 3.87–5.11)
RDW: 13.6 % (ref 11.5–15.5)
WBC: 9.7 10*3/uL (ref 4.0–10.5)

## 2014-05-17 LAB — CBG MONITORING, ED: Glucose-Capillary: 172 mg/dL — ABNORMAL HIGH (ref 70–99)

## 2014-05-17 MED ORDER — PAROXETINE HCL ER 25 MG PO TB24
50.0000 mg | ORAL_TABLET | Freq: Every evening | ORAL | Status: DC
  Administered 2014-05-18 – 2014-05-19 (×3): 50 mg via ORAL
  Filled 2014-05-17 (×3): qty 2

## 2014-05-17 MED ORDER — IRBESARTAN 150 MG PO TABS
150.0000 mg | ORAL_TABLET | Freq: Every day | ORAL | Status: DC
  Administered 2014-05-18 – 2014-05-19 (×2): 150 mg via ORAL
  Filled 2014-05-17 (×2): qty 1

## 2014-05-17 MED ORDER — ROSUVASTATIN CALCIUM 10 MG PO TABS
10.0000 mg | ORAL_TABLET | Freq: Every day | ORAL | Status: DC
  Administered 2014-05-18 – 2014-05-19 (×3): 10 mg via ORAL
  Filled 2014-05-17 (×4): qty 1

## 2014-05-17 MED ORDER — ASPIRIN 325 MG PO TABS
325.0000 mg | ORAL_TABLET | Freq: Every day | ORAL | Status: DC
  Administered 2014-05-18 – 2014-05-19 (×2): 325 mg via ORAL
  Filled 2014-05-17 (×2): qty 1

## 2014-05-17 MED ORDER — STROKE: EARLY STAGES OF RECOVERY BOOK
Freq: Once | Status: AC
  Administered 2014-05-18: 1

## 2014-05-17 MED ORDER — ATENOLOL 25 MG PO TABS
25.0000 mg | ORAL_TABLET | Freq: Every day | ORAL | Status: DC
  Administered 2014-05-18: 25 mg via ORAL
  Filled 2014-05-17 (×2): qty 1

## 2014-05-17 MED ORDER — INSULIN ASPART 100 UNIT/ML ~~LOC~~ SOLN
0.0000 [IU] | Freq: Three times a day (TID) | SUBCUTANEOUS | Status: DC
  Administered 2014-05-18 – 2014-05-19 (×4): 2 [IU] via SUBCUTANEOUS

## 2014-05-17 MED ORDER — ACETAMINOPHEN 325 MG PO TABS
650.0000 mg | ORAL_TABLET | ORAL | Status: DC | PRN
  Administered 2014-05-18 – 2014-05-19 (×2): 650 mg via ORAL
  Filled 2014-05-17 (×2): qty 2

## 2014-05-17 MED ORDER — LEVOTHYROXINE SODIUM 25 MCG PO TABS
125.0000 ug | ORAL_TABLET | Freq: Every day | ORAL | Status: DC
  Administered 2014-05-18 – 2014-05-19 (×2): 125 ug via ORAL
  Filled 2014-05-17 (×4): qty 1

## 2014-05-17 MED ORDER — INSULIN ASPART 100 UNIT/ML ~~LOC~~ SOLN: 0.0000 [IU] | Freq: Every day | SUBCUTANEOUS | Status: DC

## 2014-05-17 MED ORDER — SODIUM CHLORIDE 0.9 % IV SOLN
INTRAVENOUS | Status: AC
  Administered 2014-05-18: 02:00:00 via INTRAVENOUS

## 2014-05-17 NOTE — ED Notes (Signed)
Pt with Hx of cardiac catheterization c/o dizziness and lightheadedness onset this morning, unrelieved by laying down, dry mouth. Pt c/o posterior headache x 2 weeks. Pt c/o numbness in right side of face onset after dental work 2 weeks ago.

## 2014-05-17 NOTE — H&P (Signed)
PCP:  Michiel Sites, MD  Cardiology Allyson Sabal  Chief Complaint:  Right facial weakness  HPI: Katherine Martin is a 69 y.o. female   has a past medical history of Coronary artery disease, occlusive; PVD (peripheral vascular disease); Hypertension; Type 2 diabetes mellitus; Dyslipidemia; Hypothyroidism; Tobacco use; and Obesity (BMI 30.0-34.9).   Presented with  3 weeks ago patient had deep cleaning done for her teeth. Since then she had some some pain in the back of her head and some tooth ache and right jaw pain. 1.5 week she started to have numbness of the face mainly on the right. This was a mild sensation assocaited with some tingling.This did not affect her forehead. When patient got up this AM she felt lightheaded. She checked her BP and it was 175/95 this is higher than her usual. She checked her BG it was 211 also higher than usual. Patient noted that the water started to dribble out of the right side of her mouth.  Patient called her daughter who noticed that her mouth was drooping to the right. This was at 10:30 AM. Patient presented to [facility] ER 12:27 PM.  CT head was unremarkable. She was noted to have slight hyponatremia down to 133. Otherwise labs unremarkable. ER spoke to neurology who recommended transfer to [facility] for further evaluation. Denies any fever or chills, no chest pain, no facial pain. Patient denies any trouble walking no arm or leg weakness Patietn reports hx of PVD with occasional leg pain she is followed by Dr. Allyson Sabal for this.    Hospitalist was called for admission for Right facial droop possible TIA  Review of Systems:    Pertinent positives include:  Facial weakness and numbness lightheaded.  Constitutional:  No weight loss, night sweats, Fevers, chills, fatigue, weight loss  HEENT:  No headaches, Difficulty swallowing,Tooth/dental problems,Sore throat,  No sneezing, itching, ear ache, nasal congestion, post nasal drip,  Cardio-vascular:  No chest pain,  Orthopnea, PND, anasarca, dizziness, palpitations.no Bilateral lower extremity swelling  GI:  No heartburn, indigestion, abdominal pain, nausea, vomiting, diarrhea, change in bowel habits, loss of appetite, melena, blood in stool, hematemesis Resp:  no shortness of breath at rest. No dyspnea on exertion, No excess mucus, no productive cough, No non-productive cough, No coughing up of blood.No change in color of mucus.No wheezing. Skin:  no rash or lesions. No jaundice GU:  no dysuria, change in color of urine, no urgency or frequency. No straining to urinate.  No flank pain.  Musculoskeletal:  No joint pain or no joint swelling. No decreased range of motion. No back pain.  Psych:  No change in mood or affect. No depression or anxiety. No memory loss.  Neuro: no localizing neurological complaints, no tingling, no weakness, no double vision, no gait abnormality, no slurred speech, no confusion  Otherwise ROS are negative except for above, 10 systems were reviewed  Past Medical History: Past Medical History  Diagnosis Date  . Coronary artery disease, occlusive     cath 2007  - RCA 100%, Severe distal Circumflex  . PVD (peripheral vascular disease)     LAA Dopplers, November 2013: ABIs--0.67 right, 0.65 left bilateral common iliacs less than 50% stenosis, bilateral SFA demonstrate exclusive disease with reconstitution in the proximal segment of bronchial arteries.) He artery occluded with 2 vessel runoff on the right, 3 vessel run off on left.  . Hypertension   . Type 2 diabetes mellitus   . Dyslipidemia   . Hypothyroidism   . Tobacco  use   . Obesity (BMI 30.0-34.9)    Past Surgical History  Procedure Laterality Date  . Cardiac catheterization  03/06/2006    100% RCA, Severe distal Circumflex stenosis, moderate LAD; EF ~60% with mild anterior hypokinesis. (Dr. Richarda BladeJ. Tysinger)  . Nm myoview ltd - treadmill  02/2009    Walk 6 minutes. No ischemia or infarction.  . Lower extremity  arterial doppler  01/2012    bilateral ABI demonstrate moderate arterial insuff at rest; abd aorta with non-hemodynamically significant plaque; bilat CIA with equal/less than 50% diameter reduction; bilat SFA with occlusive disease & reconstiution in prox popliteal; R peroneal not visualized  . Lower extremity arterial doppler  September 2014    RABI: 0.55; LABI 0.53; moderate CEA, bilateral SFA-occlusive with reconstitution and proximal Popliteal; bilateral Peroneal artery occlusion.     Medications: Prior to Admission medications   Medication Sig Start Date End Date Taking? Authorizing Provider  ACTOS 15 MG tablet Take 1 tablet by mouth every morning.  09/10/12  Yes Historical Provider, MD  aspirin 81 MG tablet Take 81 mg by mouth at bedtime.    Yes Historical Provider, MD  atenolol-chlorthalidone (TENORETIC) 50-25 MG per tablet Take 0.5 tablets by mouth every evening.  10/13/12  Yes Historical Provider, MD  nitroGLYCERIN (NITROSTAT) 0.4 MG SL tablet Place 0.4 mg under the tongue every 5 (five) minutes x 3 doses as needed for chest pain.   Yes Historical Provider, MD  PAXIL CR 25 MG 24 hr tablet Take 50 mg by mouth every evening.  10/02/12  Yes Historical Provider, MD  rosuvastatin (CRESTOR) 10 MG tablet Take 10 mg by mouth at bedtime.    Yes Historical Provider, MD  SYNTHROID 125 MCG tablet Take 1 tablet by mouth daily before breakfast.  10/10/12  Yes Historical Provider, MD  valsartan (DIOVAN) 160 MG tablet Take 160 mg by mouth every morning.    Yes Historical Provider, MD    Allergies:   Allergies  Allergen Reactions  . Clindamycin/Lincomycin Anaphylaxis  . Buspar [Buspirone]   . Demerol [Meperidine] Hives  . Effexor [Venlafaxine]   . Erythromycin Hives  . Levoxyl [Levothyroxine]   . Other Hives    peaches  . Penicillins Hives  . Prednisone Hives  . Sular [Nisoldipine Er]   . Zithromax [Azithromycin]   . Zoloft [Sertraline Hcl]     Social History:  Ambulatory  independently   Lives at home alone,       reports that she has been smoking Cigarettes.  She has a 40 pack-year smoking history. She has quit using smokeless tobacco. She reports that she does not drink alcohol or use illicit drugs.    Family History: family history includes Heart attack in her brother, paternal grandfather, and sister; Heart attack (age of onset: 636) in her father; Heart failure in her paternal grandmother.    Physical Exam: Patient Vitals for the past 24 hrs:  BP Temp Temp src Pulse Resp SpO2  05/17/14 1907 - 97.8 F (36.6 C) - - - -  05/17/14 1900 151/70 mmHg - - 61 15 100 %  05/17/14 1630 163/67 mmHg - - 62 16 100 %  05/17/14 1226 174/90 mmHg 97.8 F (36.6 C) Oral 71 16 99 %    1. General:  in No Acute distress 2. Psychological: Alert and  Oriented 3. Head/ENT:   Moist  Mucous Membranes  Head Non traumatic, neck supple                           Poor Dentition 4. SKIN:    decreased Skin turgor,  Skin clean Dry and intact no rash 5. Heart: Regular rate and rhythm no Murmur, Rub or gallop 6. Lungs: Clear to auscultation bilaterally, no wheezes or crackles   7. Abdomen: Soft, non-tender, Non distended 8. Lower extremities: no clubbing, cyanosis, or edema 9. Neurologically strength 5 out of 5 in all 4 extremities there is right facial droop noted. Patient able to raise eyebrows bilaterally but possibly slightly weaker on her right.  10. MSK: Normal range of motion  body mass index is unknown because there is no weight on file.   Labs on Admission:   Results for orders placed or performed during the hospital encounter of 05/17/14 (from the past 24 hour(s))  CBG, ED     Status: Abnormal   Collection Time: 05/17/14 12:39 PM  Result Value Ref Range   Glucose-Capillary 172 (H) 70 - 99 mg/dL  I-Stat Chem 8, ED     Status: Abnormal   Collection Time: 05/17/14  1:09 PM  Result Value Ref Range   Sodium 133 (L) 135 - 145 mmol/L   Potassium 3.9 3.5 - 5.1  mmol/L   Chloride 100 96 - 112 mmol/L   BUN 16 6 - 23 mg/dL   Creatinine, Ser 3.76 0.50 - 1.10 mg/dL   Glucose, Bld 283 (H) 70 - 99 mg/dL   Calcium, Ion 1.51 (L) 1.13 - 1.30 mmol/L   TCO2 20 0 - 100 mmol/L   Hemoglobin 14.3 12.0 - 15.0 g/dL   HCT 76.1 60.7 - 37.1 %  CBC with Differential/Platelet     Status: Abnormal   Collection Time: 05/17/14  1:15 PM  Result Value Ref Range   WBC 9.7 4.0 - 10.5 K/uL   RBC 4.31 3.87 - 5.11 MIL/uL   Hemoglobin 13.5 12.0 - 15.0 g/dL   HCT 06.2 69.4 - 85.4 %   MCV 94.2 78.0 - 100.0 fL   MCH 31.3 26.0 - 34.0 pg   MCHC 33.3 30.0 - 36.0 g/dL   RDW 62.7 03.5 - 00.9 %   Platelets 242 150 - 400 K/uL   Neutrophils Relative % 81 (H) 43 - 77 %   Neutro Abs 7.9 (H) 1.7 - 7.7 K/uL   Lymphocytes Relative 11 (L) 12 - 46 %   Lymphs Abs 1.1 0.7 - 4.0 K/uL   Monocytes Relative 6 3 - 12 %   Monocytes Absolute 0.5 0.1 - 1.0 K/uL   Eosinophils Relative 2 0 - 5 %   Eosinophils Absolute 0.2 0.0 - 0.7 K/uL   Basophils Relative 0 0 - 1 %   Basophils Absolute 0.0 0.0 - 0.1 K/uL  Basic metabolic panel     Status: Abnormal   Collection Time: 05/17/14  1:15 PM  Result Value Ref Range   Sodium 132 (L) 135 - 145 mmol/L   Potassium 3.9 3.5 - 5.1 mmol/L   Chloride 99 96 - 112 mmol/L   CO2 25 19 - 32 mmol/L   Glucose, Bld 141 (H) 70 - 99 mg/dL   BUN 17 6 - 23 mg/dL   Creatinine, Ser 3.81 0.50 - 1.10 mg/dL   Calcium 9.0 8.4 - 82.9 mg/dL   GFR calc non Af Amer 68 (L) >90 mL/min   GFR calc Af Amer 79 (L) >  90 mL/min   Anion gap 8 5 - 15  Troponin I     Status: None   Collection Time: 05/17/14  1:15 PM  Result Value Ref Range   Troponin I <0.03 <0.031 ng/mL    UA not obtained  No results found for: HGBA1C  CrCl cannot be calculated (Unknown ideal weight.).  BNP (last 3 results) No results for input(s): PROBNP in the last 8760 hours.  Other results:  I have pearsonaly reviewed this: ECG REPORT  Rate: 70  Rhythm: NSR ST&T Change: no ischemic  changes   There were no vitals filed for this visit.   Cultures: No results found for: SDES, SPECREQUEST, CULT, REPTSTATUS   Radiological Exams on Admission: Ct Head Wo Contrast  05/17/2014   CLINICAL DATA:  Cardiac catheterization. Lightheadedness, dizziness.  EXAM: CT HEAD WITHOUT CONTRAST  TECHNIQUE: Contiguous axial images were obtained from the base of the skull through the vertex without intravenous contrast.  COMPARISON:  None.  FINDINGS: No acute intracranial hemorrhage. No focal mass lesion. No CT evidence of acute infarction. No midline shift or mass effect. No hydrocephalus. Basilar cisterns are patent. Paranasal sinuses and mastoid air cells are clear.  Atherosclerotic calcification of the vertebral arteries.  IMPRESSION: No acute intracranial findings.   Electronically Signed   By: Genevive Bi M.D.   On: 05/17/2014 17:17    Chart has been reviewed  Assessment/Plan  69 yo F with hx of HTN, CAD, DM2 presents with atypical features and right facial numbness.     Present on Admission:  . TIA (transient ischemic attack):  - will admit based on TIA/CVA protocol, await results of MRA/MRI, Carotid Doppler and Echo, obtain cardiac enzymes,  ECG,   Lipid panel, TSH. Order PT/OT evaluation. Will make sure patient is on antiplatelet agent.   Neurology consult.    atypical features some features worrisome for Bell's palsy  . Hypertension - continue home medications hold chlorthalidone for tonight some permissive hypertension  . CAD (coronary artery disease) - chronic stable continue home medications continue statin and aspirin  . 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 - currently stable patient will need to follow up as an outpatient  DM 2 - hold Actos order sliding scale   Prophylaxis: SCD  CODE STATUS:  FULL CODE    Other plan as per orders.  I have spent a total of 55 min on this admission  Madalyne Husk 05/17/2014, 7:09 PM  [facility]  Pager  [phone]   after 2 AM please page floor coverage PA If 7AM-7PM, please contact the day team taking care of the patient  [email or web address]  Password [email or web address]

## 2014-05-17 NOTE — Progress Notes (Signed)
[facility] hospital[facility] progress note. Chief complaint. Transfer note. History of present illness. This 69 [facility] with right facial weakness/numbness. Presentation concerning for possible TIA/CVA versus possible Bell palsy. Neurology was contacted and requested [facility] be [facility]. Patient has now arrived in transfer and I'm seeing her at bedside to ensure she remains clinically stable and that her orders transferred here appropriately. Physical exam. Vital signs. Temperature 98.2, pulse 63, respiration 16, blood pressure 159/62. O2 sats 98%. General appearance. Obese elderly female who is alert and in no distress. She is oriented. Cardiac. Rate and rhythm regular. Lungs. Breath sounds clear and equal. Abdomen. Soft with positive bowel sounds. Neurologic. Cranial nerves II-12 grossly intact. No unilateral or focal defects noted. Strength 5/5 and equal 4 extremities. Impression/plan. Problem #1. TIA/CVA versus possible Bell palsy. Patient admitted for stroke workup protocol including MRI/MRA, carotid Doppler, echocardiogram, cardiac enzymes, and ECG. Also lipid panel and TSH. Will notify neurology of patient's arrival. Problem #2 hypertension. Medications held for some permissive hypertension. Problem #3. Coronary artery disease. Patient continues on home medication statin and aspirin. Problem #4. Peripheral arterial disease. Stable but requiring outpatient follow-up. Problem #5. Diabetes. Home Actos held. Manage with sliding-scale coverage. Patient appears clinically stable post transfer. All orders appear to have transferred appropriately.

## 2014-05-17 NOTE — ED Provider Notes (Signed)
CSN: [id number]     Arrival date & time 05/17/14  1210 History   First MD Initiated Contact with Patient 05/17/14 1500     Chief Complaint  Patient presents with  . Dizziness  . Numbness     (Consider location/radiation/quality/duration/timing/severity/associated sxs/prior Treatment) HPI Comments: 69 yo female presenting with about 2 weeks of right sided facial numbness.  However, about 1-2 days ago, she developed a right sided facial droop.  She has also noticed some mild drooling.  Today, she awoke with malaise and decided to have these symptoms evaluated.    Patient is a 69 y.o. female presenting with neurologic complaint.  Neurologic Problem This is a new problem. Episode onset: 2 weeks ago. The problem occurs constantly. The problem has been gradually worsening. Pertinent negatives include no chest pain, no headaches and no shortness of breath. Associated symptoms comments: Dizziness.  "I just don't feel right."  Ear pain.  . Nothing aggravates the symptoms. Nothing relieves the symptoms.    Past Medical History  Diagnosis Date  . Coronary artery disease, occlusive     cath 2007  - RCA 100%, Severe distal Circumflex  . PVD (peripheral vascular disease)     LAA Dopplers, November 2013: ABIs--0.67 right, 0.65 left bilateral common iliacs less than 50% stenosis, bilateral SFA demonstrate exclusive disease with reconstitution in the proximal segment of bronchial arteries.) He artery occluded with 2 vessel runoff on the right, 3 vessel run off on left.  . Hypertension   . Type 2 diabetes mellitus   . Dyslipidemia   . Hypothyroidism   . Tobacco use   . Obesity (BMI 30.0-34.9)    Past Surgical History  Procedure Laterality Date  . Cardiac catheterization  03/06/2006    100% RCA, Severe distal Circumflex stenosis, moderate LAD; EF ~60% with mild anterior hypokinesis. (Dr. Richarda Blade)  . Nm myoview ltd - treadmill  02/2009    Walk 6 minutes. No ischemia or infarction.  . Lower  extremity arterial doppler  01/2012    bilateral ABI demonstrate moderate arterial insuff at rest; abd aorta with non-hemodynamically significant plaque; bilat CIA with equal/less than 50% diameter reduction; bilat SFA with occlusive disease & reconstiution in prox popliteal; R peroneal not visualized  . Lower extremity arterial doppler  September 2014    RABI: 0.55; LABI 0.53; moderate CEA, bilateral SFA-occlusive with reconstitution and proximal Popliteal; bilateral Peroneal artery occlusion.   Family History  Problem Relation Age of Onset  . Heart attack Father 79  . Heart failure Paternal Grandmother   . Heart attack Paternal Grandfather   . Heart attack Brother     CABG  . Heart attack Sister    History  Substance Use Topics  . Smoking status: Current Every Day Smoker -- 1.00 packs/day for 40 years    Types: Cigarettes  . Smokeless tobacco: Former Neurosurgeon     Comment: She is trying the electronic cigarette.  . Alcohol Use: No   OB History    No data available     Review of Systems  Respiratory: Negative for shortness of breath.   Cardiovascular: Negative for chest pain.  Neurological: Negative for headaches.  All other systems reviewed and are negative.     Allergies  Clindamycin/lincomycin; Buspar; Demerol; Effexor; Erythromycin; Levoxyl; Other; Penicillins; Prednisone; Sular; Zithromax; and Zoloft  Home Medications   Prior to Admission medications   Medication Sig Start Date End Date Taking? Authorizing Provider  ACTOS 15 MG tablet Take 1 tablet by mouth  every morning.  09/10/12  Yes Historical Provider, MD  aspirin 81 MG tablet Take 81 mg by mouth at bedtime.    Yes Historical Provider, MD  atenolol-chlorthalidone (TENORETIC) 50-25 MG per tablet Take 0.5 tablets by mouth every evening.  10/13/12  Yes Historical Provider, MD  nitroGLYCERIN (NITROSTAT) 0.4 MG SL tablet Place 0.4 mg under the tongue every 5 (five) minutes x 3 doses as needed for chest pain.   Yes  Historical Provider, MD  PAXIL CR 25 MG 24 hr tablet Take 50 mg by mouth every evening.  10/02/12  Yes Historical Provider, MD  rosuvastatin (CRESTOR) 10 MG tablet Take 10 mg by mouth at bedtime.    Yes Historical Provider, MD  SYNTHROID 125 MCG tablet Take 1 tablet by mouth daily before breakfast.  10/10/12  Yes Historical Provider, MD  valsartan (DIOVAN) 160 MG tablet Take 160 mg by mouth every morning.    Yes Historical Provider, MD   BP 174/90 mmHg  Pulse 71  Temp(Src) 97.8 F (36.6 C) (Oral)  Resp 16  SpO2 99% Physical Exam  Constitutional: She is oriented to person, place, and time. She appears well-developed and well-nourished. No distress.  HENT:  Head: Normocephalic and atraumatic.  Mouth/Throat: Oropharynx is clear and moist.  Eyes: Conjunctivae are normal. Pupils are equal, round, and reactive to light. No scleral icterus.  Neck: Neck supple.  Cardiovascular: Normal rate, regular rhythm, normal heart sounds and intact distal pulses.   No murmur heard. Pulmonary/Chest: Effort normal and breath sounds normal. No stridor. No respiratory distress. She has no rales.  Abdominal: Soft. Bowel sounds are normal. She exhibits no distension. There is no tenderness.  Musculoskeletal: Normal range of motion.  Neurological: She is alert and oriented to person, place, and time. She has normal strength. A cranial nerve deficit (mild right sided facial weakness with sparing of forehead) is present. No sensory deficit. Coordination and gait normal. GCS eye subscore is 4. GCS verbal subscore is 5. GCS motor subscore is 6.  Skin: Skin is warm and dry. No rash noted.  Psychiatric: She has a normal mood and affect. Her behavior is normal.  Nursing note and vitals reviewed.   ED Course  Procedures (including critical care time) Labs Review Labs Reviewed  CBC WITH DIFFERENTIAL/PLATELET - Abnormal; Notable for the following:    Neutrophils Relative % 81 (*)    Neutro Abs 7.9 (*)    Lymphocytes  Relative 11 (*)    All other components within normal limits  BASIC METABOLIC PANEL - Abnormal; Notable for the following:    Sodium 132 (*)    Glucose, Bld 141 (*)    GFR calc non Af Amer 68 (*)    GFR calc Af Amer 79 (*)    All other components within normal limits  CBG MONITORING, ED - Abnormal; Notable for the following:    Glucose-Capillary 172 (*)    All other components within normal limits  I-STAT CHEM 8, ED - Abnormal; Notable for the following:    Sodium 133 (*)    Glucose, Bld 145 (*)    Calcium, Ion 0.98 (*)    All other components within normal limits  TROPONIN I  HEMOGLOBIN A1C  LIPID PANEL  URINALYSIS, ROUTINE W REFLEX MICROSCOPIC    Imaging Review Ct Head Wo Contrast  05/17/2014   CLINICAL DATA:  Cardiac catheterization. Lightheadedness, dizziness.  EXAM: CT HEAD WITHOUT CONTRAST  TECHNIQUE: Contiguous axial images were obtained from the base of the skull  through the vertex without intravenous contrast.  COMPARISON:  None.  FINDINGS: No acute intracranial hemorrhage. No focal mass lesion. No CT evidence of acute infarction. No midline shift or mass effect. No hydrocephalus. Basilar cisterns are patent. Paranasal sinuses and mastoid air cells are clear.  Atherosclerotic calcification of the vertebral arteries.  IMPRESSION: No acute intracranial findings.   Electronically Signed   By: Genevive Bi M.D.   On: 05/17/2014 17:17  All radiology studies independently viewed by me.      EKG Interpretation   Date/Time:  Saturday May 17 2014 12:37:11 EST Ventricular Rate:  70 PR Interval:  158 QRS Duration: 84 QT Interval:  392 QTC Calculation: 423 R Axis:   58 Text Interpretation:  Sinus rhythm Baseline wander in lead(s) I aVR No old  tracing to compare Confirmed by Ethelda Chick  MD, SAM [phone]) on 05/17/2014  12:45:34 PM      MDM   Final diagnoses:  Facial weakness    Will need eval for stroke.  Has right sided facial droop, but forehead sparing.  She  reported that she is unsure exactly when her facial droop began, but thinks it was 1-2 days ago.  Therefore, no code stroke called and no tPA given.  She also has low NIHSS.  CT negative.  Will be admitted for further workup.      Candyce Churn III, MD 05/17/14 [phone]

## 2014-05-17 NOTE — ED Notes (Signed)
Awake. Verbally responsive. A/O x4. Resp even and unlabored. No audible adventitious breath sounds noted. ABC's intact. SB on monitor at 56bpm.

## 2014-05-17 NOTE — ED Notes (Signed)
Awake. Verbally responsive. A/O x4. Resp even and unlabored. No audible adventitious breath sounds noted. ABC's intact. SR on monitor at 60 bmp. NAD noted. Family at bedside.

## 2014-05-17 NOTE — ED Notes (Signed)
Pt ambulated to bathroom with steady gait. MAEE. Family at bedside.

## 2014-05-17 NOTE — ED Notes (Signed)
[facility] arrived to transport pt to [place]. 

## 2014-05-17 NOTE — ED Notes (Signed)
Awake. Verbally responsive. A/O x4. Resp even and unlabored. No audible adventitious breath sounds noted. ABC's intact. SB on monitor at 57bpm. Family at bedside. IV saline lock patent and intact. MAEE. Continues to have rt side facial weakness and drooping.

## 2014-05-17 NOTE — ED Notes (Signed)
Per Vonna KotykJay in lab, tubes were sent earlier to be saved and labs can be resulted from those.

## 2014-05-17 NOTE — ED Notes (Signed)
Ivin BootyJoshua with [facility] called and gave report.

## 2014-05-17 NOTE — ED Notes (Signed)
Pt sts that she has had R sided  facial droop and numbness since having dental work performed over 1 week ago

## 2014-05-17 NOTE — ED Notes (Signed)
Dr.Knapp at bedside  

## 2014-05-18 ENCOUNTER — Inpatient Hospital Stay (HOSPITAL_COMMUNITY): Payer: Medicare Other

## 2014-05-18 DIAGNOSIS — R29810 Facial weakness: Secondary | ICD-10-CM

## 2014-05-18 DIAGNOSIS — G459 Transient cerebral ischemic attack, unspecified: Secondary | ICD-10-CM

## 2014-05-18 DIAGNOSIS — E1159 Type 2 diabetes mellitus with other circulatory complications: Secondary | ICD-10-CM

## 2014-05-18 LAB — GLUCOSE, CAPILLARY
GLUCOSE-CAPILLARY: 134 mg/dL — AB (ref 70–99)
GLUCOSE-CAPILLARY: 123 mg/dL — AB (ref 70–99)
Glucose-Capillary: 106 mg/dL — ABNORMAL HIGH (ref 70–99)
Glucose-Capillary: 89 mg/dL (ref 70–99)

## 2014-05-18 LAB — URINALYSIS, ROUTINE W REFLEX MICROSCOPIC
Bilirubin Urine: NEGATIVE
Glucose, UA: NEGATIVE mg/dL
HGB URINE DIPSTICK: NEGATIVE
KETONES UR: NEGATIVE mg/dL
Leukocytes, UA: NEGATIVE
NITRITE: NEGATIVE
PROTEIN: NEGATIVE mg/dL
Specific Gravity, Urine: 1.009 (ref 1.005–1.030)
Urobilinogen, UA: 0.2 mg/dL (ref 0.0–1.0)
pH: 6.5 (ref 5.0–8.0)

## 2014-05-18 LAB — LIPID PANEL
Cholesterol: 159 mg/dL (ref 0–200)
HDL: 41 mg/dL (ref >39)
LDL CALC: 87 mg/dL (ref 0–99)
Total CHOL/HDL Ratio: 3.9 RATIO
Triglycerides: 155 mg/dL — ABNORMAL HIGH (ref <150)
VLDL: 31 mg/dL (ref 0–40)

## 2014-05-18 NOTE — Progress Notes (Signed)
Pt arrived to room via [facility]. [facility] Currently no numbness or tingling on face, but pt states it comes and goes. VSS. Pt placed on tele. Stroke education was performed and stroke book was given to pt. Will continue to monitor.

## 2014-05-18 NOTE — Progress Notes (Signed)
[facility] PROGRESS NOTE  Katherine Martin ZOX:[id number] DOB: 11-16-1945 DOA: 05/17/2014 PCP: Michiel Sites, MD  Assessment/Plan: 1. TIA versus bells palsy- patient has Bell's palsy versus TIA. Neurology following. MRI is negative for stroke. Carotid duplex and echo has been ordered. Continue aspirin 325 mg by mouth daily. 2. Hypertension- BP stable, chlorthalidone hold for permissive hypertension 3. CAD- stable continue statin and aspirin 4. Diabetes mellitus- continue sliding scale insulin  Code Status: Full code Family Communication: No family at bedside Disposition Plan: Home when stable   Consultants:  Neurology   Procedures:  None  Antibiotics:  None  HPI/Subjective: *69 y.o. female has a past medical history of Coronary artery disease, occlusive; PVD (peripheral vascular disease); Hypertension; Type 2 diabetes mellitus; Dyslipidemia; Hypothyroidism; Tobacco use; and Obesity (BMI 30.0-34.9).   3 weeks ago patient had deep cleaning done for her teeth. Since then she had some some pain in the back of her head and some tooth ache and right jaw pain. 1.5 week she started to have numbness of the face mainly on the right. This was a mild sensation assocaited with some tingling.This did not affect her forehead. When patient got up this AM she felt lightheaded. She checked her BP and it was 175/95 this is higher than her usual. She checked her BG it was 211 also higher than usual. Patient noted that the water started to dribble out of the right side of her mouth. Patient called her daughter who noticed that her mouth was drooping to the right. This was at 10:30 AM. Patient presented to [facility] ER 12:27 PM. CT head was unremarkable. She was noted to have slight hyponatremia down to 133. Otherwise labs unremarkable. ER spoke to neurology who recommended transfer to [facility] for further evaluation. Denies any fever or chills, no chest pain, no facial pain. Patient denies any trouble  walking no arm or leg weakness.  Patient seen and examined, still has right sided facial droop.  Objective: Filed Vitals:   05/18/14 1012  BP: 162/62  Pulse: 65  Temp: 98.6 F (37 C)  Resp: 16   No intake or output data in the 24 hours ending 05/18/14 1249 Filed Weights   05/17/14 2337  Weight: 81.1 kg (178 lb 12.7 oz)    Exam:   General:  Appear in no acute dsitress  Cardiovascular: S1s2 RRR  Respiratory: Clear bilaterally  Abdomen: Soft, nontender  Musculoskeletal: No edema of the lower extremities  Neuro- right-sided facial droop  Data Reviewed: Basic Metabolic Panel:  Recent Labs Lab 05/17/14 1309 05/17/14 1315  NA 133* 132*  K 3.9 3.9  CL 100 99  CO2  --  25  GLUCOSE 145* 141*  BUN 16 17  CREATININE 0.80 0.87  CALCIUM  --  9.0   Liver Function Tests: No results for input(s): AST, ALT, ALKPHOS, BILITOT, PROT, ALBUMIN in the last 168 hours. No results for input(s): LIPASE, AMYLASE in the last 168 hours. No results for input(s): AMMONIA in the last 168 hours. CBC:  Recent Labs Lab 05/17/14 1309 05/17/14 1315  WBC  --  9.7  NEUTROABS  --  7.9*  HGB 14.3 13.5  HCT 42.0 40.6  MCV  --  94.2  PLT  --  242   Cardiac Enzymes:  Recent Labs Lab 05/17/14 1315  TROPONINI <0.03   BNP (last 3 results) No results for input(s): BNP in the last 8760 hours.  ProBNP (last 3 results) No results for input(s): PROBNP in the last 8760 hours.  CBG:  Recent Labs Lab 05/17/14 1239 05/17/14 2345 05/18/14 0634 05/18/14 1135  GLUCAP 172* 138* 134* 123*    No results found for this or any previous visit (from the past 240 hour(s)).   Studies: Ct Head Wo Contrast  05/17/2014   CLINICAL DATA:  Cardiac catheterization. Lightheadedness, dizziness.  EXAM: CT HEAD WITHOUT CONTRAST  TECHNIQUE: Contiguous axial images were obtained from the base of the skull through the vertex without intravenous contrast.  COMPARISON:  None.  FINDINGS: No acute  intracranial hemorrhage. No focal mass lesion. No CT evidence of acute infarction. No midline shift or mass effect. No hydrocephalus. Basilar cisterns are patent. Paranasal sinuses and mastoid air cells are clear.  Atherosclerotic calcification of the vertebral arteries.  IMPRESSION: No acute intracranial findings.   Electronically Signed   By: Genevive BiStewart  Edmunds M.D.   On: 05/17/2014 17:17   Mri Brain Without Contrast  05/18/2014   CLINICAL DATA:  RIGHT facial numbness, weakness and headache. Symptoms for 3 weeks after ear dental cleaning. Worsening RIGHT ear and jaw pain for a few days. RIGHT facial weakness this morning. History of hypertension, diabetes and dyslipidemia.  EXAM: MRI HEAD WITHOUT CONTRAST  MRA HEAD WITHOUT CONTRAST  TECHNIQUE: Multiplanar, multiecho pulse sequences of the brain and surrounding structures were obtained without intravenous contrast. Angiographic images of the head were obtained using MRA technique without contrast.  COMPARISON:  CT of the head February 15, 2015  FINDINGS: MRI HEAD FINDINGS  The ventricles and sulci are normal for patient's age. No abnormal parenchymal signal, mass lesions, mass effect. No reduced diffusion to suggest acute ischemia. No susceptibility artifact to suggest hemorrhage. Mild patchy supratentorial white matter T2 hyperintensities.  No abnormal extra-axial fluid collections. No extra-axial masses though, contrast enhanced sequences would be more sensitive. Normal major intracranial vascular flow voids seen at the skull base.  Ocular globes and orbital contents are unremarkable though not tailored for evaluation. No abnormal sellar expansion. Visualized paranasal sinuses and mastoid air cells are well-aerated. No suspicious calvarial bone marrow signal. No abnormal sellar expansion. Craniocervical junction maintained. 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  MRA HEAD FINDINGS  Moderately motion degraded examination.  Anterior circulation: Normal flow related  enhancement of the included cervical, petrous, cavernous and supra clinoid internal carotid arteries. Patent anterior communicating artery. Normal flow related enhancement of the anterior and middle cerebral arteries, including more distal segments.  No large vessel occlusion. Motion degrades sensitivity for aneurysm, luminal irregularity. Mid-low grade stenosis.  Posterior circulation: Codominant vertebral arteries. Basilar artery is patent, with normal flow related enhancement of the main branch vessels. Normal flow related enhancement of the posterior cerebral arteries.  IMPRESSION: MRI HEAD: No acute intracranial process, normal noncontrast MRI of the brain for age.  MRA HEAD: Moderately motion degraded examination without large vessel occlusion or definite high-grade stenosis.   Electronically Signed   By: Awilda Metroourtnay  Bloomer   On: 05/18/2014 02:11   Mr Maxine GlennMra Head/brain Wo Cm  05/18/2014   CLINICAL DATA:  RIGHT facial numbness, weakness and headache. Symptoms for 3 weeks after ear dental cleaning. Worsening RIGHT ear and jaw pain for a few days. RIGHT facial weakness this morning. History of hypertension, diabetes and dyslipidemia.  EXAM: MRI HEAD WITHOUT CONTRAST  MRA HEAD WITHOUT CONTRAST  TECHNIQUE: Multiplanar, multiecho pulse sequences of the brain and surrounding structures were obtained without intravenous contrast. Angiographic images of the head were obtained using MRA technique without contrast.  COMPARISON:  CT of the head February 15, 2015  FINDINGS:  MRI HEAD FINDINGS  The ventricles and sulci are normal for patient's age. No abnormal parenchymal signal, mass lesions, mass effect. No reduced diffusion to suggest acute ischemia. No susceptibility artifact to suggest hemorrhage. Mild patchy supratentorial white matter T2 hyperintensities.  No abnormal extra-axial fluid collections. No extra-axial masses though, contrast enhanced sequences would be more sensitive. Normal major intracranial vascular flow  voids seen at the skull base.  Ocular globes and orbital contents are unremarkable though not tailored for evaluation. No abnormal sellar expansion. Visualized paranasal sinuses and mastoid air cells are well-aerated. No suspicious calvarial bone marrow signal. No abnormal sellar expansion. Craniocervical junction maintained. 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  MRA HEAD FINDINGS  Moderately motion degraded examination.  Anterior circulation: Normal flow related enhancement of the included cervical, petrous, cavernous and supra clinoid internal carotid arteries. Patent anterior communicating artery. Normal flow related enhancement of the anterior and middle cerebral arteries, including more distal segments.  No large vessel occlusion. Motion degrades sensitivity for aneurysm, luminal irregularity. Mid-low grade stenosis.  Posterior circulation: Codominant vertebral arteries. Basilar artery is patent, with normal flow related enhancement of the main branch vessels. Normal flow related enhancement of the posterior cerebral arteries.  IMPRESSION: MRI HEAD: No acute intracranial process, normal noncontrast MRI of the brain for age.  MRA HEAD: Moderately motion degraded examination without large vessel occlusion or definite high-grade stenosis.   Electronically Signed   By: Awilda Metro   On: 05/18/2014 02:11    Scheduled Meds: . aspirin  325 mg Oral Daily  . atenolol  25 mg Oral Daily  . insulin aspart  0-15 Units Subcutaneous TID WC  . insulin aspart  0-5 Units Subcutaneous QHS  . irbesartan  150 mg Oral Daily  . levothyroxine  125 mcg Oral QAC breakfast  . PARoxetine  50 mg Oral QPM  . rosuvastatin  10 mg Oral QHS   Continuous Infusions:   Active Problems:   CAD (coronary artery disease)   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   Hypertension   Tobacco abuse counseling   Diabetes mellitus   Right facial numbness   TIA (transient ischemic attack)    Time spent: *25  min   [facility]  [facility] Pager [phone] . If 7PM-7AM, please contact night-coverage at [email or web address], password [email or web address] 05/18/2014, 12:49 PM  LOS: 1 day

## 2014-05-18 NOTE — Progress Notes (Signed)
  Echocardiogram 2D Echocardiogram has been performed.  Stacey DrainWhite, Adrine Hayworth J 05/18/2014, 1:57 PM

## 2014-05-18 NOTE — Consult Note (Signed)
Referring Physician: [facility]    Chief Complaint: right face numbness/weakness, HA.  HPI:                                                                                                                                         Katherine Martin is an 69 y.o. female with a past medical history significant for HTN, DM type II, hyperlipidemia, obesity, CAD, tobacco use, and PVD, transferred to [facility] for further evaluation of the above stated symptoms. Patient stated that she never had similar symptoms before but 3 weeks ago she went to the dentist and had deep cleaning of her teeth and ever since has been experiencing numbness that started around the right nostril and subsequently moved to her jaw and lips. She mentioned that she had pain behind the right ear and jaw  that lasted couple of days, but still with HA located on the frontal region. Then, today around 10:3o am she noticed weakness of the right face and " my gum pulling to the left" Denies increase tearing, altered taste, no hyperacusis, difficulty drinking or eating. No recent upper respiratory infection, fever, or skin rash. I personally reviewed CT brain done at [facility] which showed no acute abnormality. Date last known well: uncertain Time last known well: uncertain tPA Given: no, out of the window   Past Medical History  Diagnosis Date  . Coronary artery disease, occlusive     cath 2007  - RCA 100%, Severe distal Circumflex  . PVD (peripheral vascular disease)     LAA Dopplers, November 2013: ABIs--0.67 right, 0.65 left bilateral common iliacs less than 50% stenosis, bilateral SFA demonstrate exclusive disease with reconstitution in the proximal segment of bronchial arteries.) He artery occluded with 2 vessel runoff on the right, 3 vessel run off on left.  . Hypertension   . Type 2 diabetes mellitus   . Dyslipidemia   . Hypothyroidism   . Tobacco use   . Obesity (BMI 30.0-34.9)     Past Surgical History  Procedure Laterality Date  .  Cardiac catheterization  03/06/2006    100% RCA, Severe distal Circumflex stenosis, moderate LAD; EF ~60% with mild anterior hypokinesis. (Dr. Jenna Luo)  . Nm myoview ltd - treadmill  02/2009    Walk 6 minutes. No ischemia or infarction.  . Lower extremity arterial doppler  01/2012    bilateral ABI demonstrate moderate arterial insuff at rest; abd aorta with non-hemodynamically significant plaque; bilat CIA with equal/less than 50% diameter reduction; bilat SFA with occlusive disease & reconstiution in prox popliteal; R peroneal not visualized  . Lower extremity arterial doppler  September 2014    RABI: 0.55; LABI 0.53; moderate CEA, bilateral SFA-occlusive with reconstitution and proximal Popliteal; bilateral Peroneal artery occlusion.    Family History  Problem Relation Age of Onset  . Heart attack Father 101  . Heart failure Paternal Grandmother   . Heart  attack Paternal Grandfather   . Heart attack Brother     CABG  . Heart attack Sister    Social History:  reports that she has been smoking Cigarettes.  She has a 40 pack-year smoking history. She has quit using smokeless tobacco. She reports that she does not drink alcohol or use illicit drugs. Family history: no epilepsy, brain tumors, or brain aneurysms Allergies:  Allergies  Allergen Reactions  . Clindamycin/Lincomycin Anaphylaxis  . Buspar [Buspirone]   . Demerol [Meperidine] Hives  . Effexor [Venlafaxine]   . Erythromycin Hives  . Levoxyl [Levothyroxine]   . Other Hives    peaches  . Penicillins Hives  . Prednisone Hives  . Sular [Nisoldipine Er]   . Zithromax [Azithromycin]   . Zoloft [Sertraline Hcl]     Medications:                                                                                                                           Scheduled: . aspirin  325 mg Oral Daily  . atenolol  25 mg Oral Daily  . insulin aspart  0-15 Units Subcutaneous TID WC  . insulin aspart  0-5 Units Subcutaneous QHS  .  irbesartan  150 mg Oral Daily  . levothyroxine  125 mcg Oral QAC breakfast  . PARoxetine  50 mg Oral QPM  . rosuvastatin  10 mg Oral QHS    ROS:                                                                                                                                       History obtained from the patient and chart review  General ROS: negative for - chills, fatigue, fever, night sweats, or weight loss Psychological ROS: negative for - behavioral disorder, hallucinations, memory difficulties, mood swings or suicidal ideation Ophthalmic ROS: negative for - blurry vision, double vision, eye pain or loss of vision ENT ROS: negative for - epistaxis, nasal discharge, oral lesions, sore throat, tinnitus or vertigo Allergy and Immunology ROS: negative for - hives or itchy/watery eyes Hematological and Lymphatic ROS: negative for - bleeding problems, bruising or swollen lymph nodes Endocrine ROS: negative for - galactorrhea, hair pattern changes, polydipsia/polyuria or temperature intolerance Respiratory ROS: negative for - cough, hemoptysis, shortness of breath or wheezing Cardiovascular ROS: negative for - chest pain, dyspnea on exertion, edema or irregular heartbeat Gastrointestinal ROS:  negative for - abdominal pain, diarrhea, hematemesis, nausea/vomiting or stool incontinence Genito-Urinary ROS: negative for - dysuria, hematuria, incontinence or urinary frequency/urgency Musculoskeletal ROS: negative for - joint swelling or muscular weakness Neurological ROS: as noted in HPI Dermatological ROS: negative for rash and skin lesion changes  Physical exam: pleasant female in no apparent distress. Blood pressure 159/62, pulse 63, temperature 98.2 F (36.8 C), temperature source Oral, resp. rate 16, height _0  (1.575 m), weight 81.1 kg (178 lb 12.7 oz), SpO2 98 %. Head: normocephalic. Neck: supple, no bruits, no JVD. Cardiac: no murmurs. Lungs: clear. Abdomen: soft, no tender, no  mass. Extremities: no edema. Skin: no rash Neurologic Examination:                                                                                                      General: Mental Status: Alert, oriented, thought content appropriate.  Speech fluent without evidence of aphasia.  Able to follow 3 step commands without difficulty. Cranial Nerves: II: Discs flat bilaterally; Visual fields grossly normal, pupils equal, round, reactive to light and accommodation III,IV, VI: ptosis not present, extra-ocular motions intact bilaterally V,VII: smile asymmetric due to weakness right orbicularis oris, right orbicularis oculi, and right buccinator. Facial light touch sensation normal bilaterally VIII: hearing normal bilaterally IX,X: gag reflex present XI: bilateral shoulder shrug XII: midline tongue extension without atrophy or fasciculations Motor: Right : Upper extremity   5/5    Left:     Upper extremity   5/5  Lower extremity   5/5     Lower extremity   5/5 Tone and bulk:normal tone throughout; no atrophy noted Sensory: Pinprick and light touch intact throughout, bilaterally Deep Tendon Reflexes:  Right: Upper Extremity   Left: Upper extremity   biceps (C-5 to C-6) 2/4   biceps (C-5 to C-6) 2/4 tricep (C7) 2/4    triceps (C7) 2/4 Brachioradialis (C6) 2/4  Brachioradialis (C6) 2/4  Lower Extremity Lower Extremity  quadriceps (L-2 to L-4) 2/4   quadriceps (L-2 to L-4) 2/4 Achilles (S1) 2/4   Achilles (S1) 2/4  Plantars: Right: downgoing   Left: downgoing Cerebellar: normal finger-to-nose,  normal heel-to-shin test Gait:  Deferred for safety reasons    Results for orders placed or performed during the hospital encounter of 05/17/14 (from the past 48 hour(s))  CBG, ED     Status: Abnormal   Collection Time: 05/17/14 12:39 PM  Result Value Ref Range   Glucose-Capillary 172 (H) 70 - 99 mg/dL  I-Stat Chem 8, ED     Status: Abnormal   Collection Time: 05/17/14  1:09 PM  Result  Value Ref Range   Sodium 133 (L) 135 - 145 mmol/L   Potassium 3.9 3.5 - 5.1 mmol/L   Chloride 100 96 - 112 mmol/L   BUN 16 6 - 23 mg/dL   Creatinine, Ser 0.80 0.50 - 1.10 mg/dL   Glucose, Bld 145 (H) 70 - 99 mg/dL   Calcium, Ion 0.98 (L) 1.13 - 1.30 mmol/L   TCO2 20 0 - 100 mmol/L   Hemoglobin 14.3 12.0 -  15.0 g/dL   HCT 42.0 36.0 - 46.0 %  CBC with Differential/Platelet     Status: Abnormal   Collection Time: 05/17/14  1:15 PM  Result Value Ref Range   WBC 9.7 4.0 - 10.5 K/uL   RBC 4.31 3.87 - 5.11 MIL/uL   Hemoglobin 13.5 12.0 - 15.0 g/dL   HCT 40.6 36.0 - 46.0 %   MCV 94.2 78.0 - 100.0 fL   MCH 31.3 26.0 - 34.0 pg   MCHC 33.3 30.0 - 36.0 g/dL   RDW 13.6 11.5 - 15.5 %   Platelets 242 150 - 400 K/uL   Neutrophils Relative % 81 (H) 43 - 77 %   Neutro Abs 7.9 (H) 1.7 - 7.7 K/uL   Lymphocytes Relative 11 (L) 12 - 46 %   Lymphs Abs 1.1 0.7 - 4.0 K/uL   Monocytes Relative 6 3 - 12 %   Monocytes Absolute 0.5 0.1 - 1.0 K/uL   Eosinophils Relative 2 0 - 5 %   Eosinophils Absolute 0.2 0.0 - 0.7 K/uL   Basophils Relative 0 0 - 1 %   Basophils Absolute 0.0 0.0 - 0.1 K/uL  Basic metabolic panel     Status: Abnormal   Collection Time: 05/17/14  1:15 PM  Result Value Ref Range   Sodium 132 (L) 135 - 145 mmol/L   Potassium 3.9 3.5 - 5.1 mmol/L   Chloride 99 96 - 112 mmol/L   CO2 25 19 - 32 mmol/L   Glucose, Bld 141 (H) 70 - 99 mg/dL   BUN 17 6 - 23 mg/dL   Creatinine, Ser 0.87 0.50 - 1.10 mg/dL   Calcium 9.0 8.4 - 10.5 mg/dL   GFR calc non Af Amer 68 (L) >90 mL/min   GFR calc Af Amer 79 (L) >90 mL/min    Comment: (NOTE) The eGFR has been calculated using the CKD EPI equation. This calculation has not been validated in all clinical situations. eGFR's persistently <90 mL/min signify possible Chronic Kidney Disease.    Anion gap 8 5 - 15    Comment: Performed at [facility]  Troponin I     Status: None   Collection Time: 05/17/14  1:15 PM  Result Value Ref Range    Troponin I <0.03 <0.031 ng/mL    Comment:        NO INDICATION OF MYOCARDIAL INJURY. Performed at [facility]   Glucose, capillary     Status: Abnormal   Collection Time: 05/17/14 11:45 PM  Result Value Ref Range   Glucose-Capillary 138 (H) 70 - 99 mg/dL   Comment 1 Notify RN    Comment 2 Documented in Char    Ct Head Wo Contrast  05/17/2014   CLINICAL DATA:  Cardiac catheterization. Lightheadedness, dizziness.  EXAM: CT HEAD WITHOUT CONTRAST  TECHNIQUE: Contiguous axial images were obtained from the base of the skull through the vertex without intravenous contrast.  COMPARISON:  None.  FINDINGS: No acute intracranial hemorrhage. No focal mass lesion. No CT evidence of acute infarction. No midline shift or mass effect. No hydrocephalus. Basilar cisterns are patent. Paranasal sinuses and mastoid air cells are clear.  Atherosclerotic calcification of the vertebral arteries.  IMPRESSION: No acute intracranial findings.   Electronically Signed   By: Suzy Bouchard M.D.   On: 05/17/2014 17:17   Assessment: 69 y.o. female with multiple risk factors for stroke, admitted due to a 3 weeks history of right face paresthesias and new onset right face weakness  noticed this morning. Except for the above described right face weakness, the neuro-exam is unremarkable. CT brain unemarkable. The pattern of weakness seems to be suggestive of a right facial palsy peripheral type, likely atypical Bell's palsy. However, patient has multiple risk factors for stroke and agree with MRI brain and pursuing further stroke work up only if MRI negative. Asprin. Will follow up.  Stroke Risk Factors -  HTN, DM type II, hyperlipidemia, obesity, CAD, tobacco use,   Dorian Pod, MD [facility] [phone]  05/18/2014, 12:03 AM

## 2014-05-18 NOTE — Evaluation (Signed)
Physical Therapy Evaluation Patient Details Name: Katherine Martin MRN: [id number] DOB: 01-16-1946 Today's Date: 05/18/2014   History of Present Illness  Admitted with facial numbness, weakness, assymetry; suspect Bell's Palsy, but with many risk factors for CVA, undergoing stroke workup   Past Medical History  Diagnosis Date  . Coronary artery disease, occlusive     cath 2007  - RCA 100%, Severe distal Circumflex  . PVD (peripheral vascular disease)     LAA Dopplers, November 2013: ABIs--0.67 right, 0.65 left bilateral common iliacs less than 50% stenosis, bilateral SFA demonstrate exclusive disease with reconstitution in the proximal segment of bronchial arteries.) He artery occluded with 2 vessel runoff on the right, 3 vessel run off on left.  . Hypertension   . Type 2 diabetes mellitus   . Dyslipidemia   . Hypothyroidism   . Tobacco use   . Obesity (BMI 30.0-34.9)    Past Surgical History  Procedure Laterality Date  . Cardiac catheterization  03/06/2006    100% RCA, Severe distal Circumflex stenosis, moderate LAD; EF ~60% with mild anterior hypokinesis. (Dr. Richarda BladeJ. Tysinger)  . Nm myoview ltd - treadmill  02/2009    Walk 6 minutes. No ischemia or infarction.  . Lower extremity arterial doppler  01/2012    bilateral ABI demonstrate moderate arterial insuff at rest; abd aorta with non-hemodynamically significant plaque; bilat CIA with equal/less than 50% diameter reduction; bilat SFA with occlusive disease & reconstiution in prox popliteal; R peroneal not visualized  . Lower extremity arterial doppler  September 2014    RABI: 0.55; LABI 0.53; moderate CEA, bilateral SFA-occlusive with reconstitution and proximal Popliteal; bilateral Peroneal artery occlusion.     Clinical Impression   Patient evaluated by Physical Therapy with no further acute PT needs identified. All education has been completed and the patient has no further questions. Pt had specific questions re: depression; Took  time to validate pt's frustration with the situation, and encouraged her to discuss questions with MD, and continue regular follow-up with her primary MD;   See below for any follow-up Physical Therapy or equipment needs. PT is signing off. Thank you for this referral.     Follow Up Recommendations No PT follow up    Equipment Recommendations  None recommended by PT    Recommendations for Other Services       Precautions / Restrictions Precautions Precautions: None      Mobility  Bed Mobility                  Transfers Overall transfer level: Modified independent                  Ambulation/Gait Ambulation/Gait assistance: Modified independent (Device/Increase time) Ambulation Distance (Feet): 520 Feet (greater than) Assistive device: None Gait Pattern/deviations: Step-through pattern Gait velocity: somewhat slow   General Gait Details: overall managing well; no loss of balance noted  Stairs            Wheelchair Mobility    Modified Rankin (Stroke Patients Only) Modified Rankin (Stroke Patients Only) Pre-Morbid Rankin Score: No symptoms Modified Rankin: No significant disability     Balance Overall balance assessment: No apparent balance deficits (not formally assessed)                                           Pertinent Vitals/Pain Pain Assessment: No/denies pain    Home  Living Family/patient expects to be discharged to:: Private residence Living Arrangements: Alone Available Help at Discharge: Family;Available PRN/intermittently Type of Home: House Home Access: Level entry     Home Layout: One level Home Equipment: None      Prior Function Level of Independence: Independent         Comments: Still driving     Hand Dominance        Extremity/Trunk Assessment   Upper Extremity Assessment: Overall WFL for tasks assessed           Lower Extremity Assessment: Overall WFL for tasks assessed          Communication   Communication: Other (comment) (subjective report of difficulty due to facial weakness)  Cognition Arousal/Alertness: Awake/alert Behavior During Therapy: WFL for tasks assessed/performed Overall Cognitive Status: Within Functional Limits for tasks assessed                      General Comments General comments (skin integrity, edema, etc.): Discussed risk factors and signs and symptoms of CVA; she could use reinforcement    Exercises        Assessment/Plan    PT Assessment Patent does not need any further PT services  PT Diagnosis Other (comment) (facial assymetry; Stroke workup)   PT Problem List    PT Treatment Interventions     PT Goals (Current goals can be found in the Care Plan section) Acute Rehab PT Goals Patient Stated Goal: hopes to go home today PT Goal Formulation: All assessment and education complete, DC therapy    Frequency     Barriers to discharge        Co-evaluation               End of Session   Activity Tolerance: Patient tolerated treatment well Patient left: in chair Nurse Communication: Mobility status         Time: 1610-9604 (approx) PT Time Calculation (min) (ACUTE ONLY): 20 min   Charges:   PT Evaluation $Initial PT Evaluation Tier I: 1 Procedure     PT G CodesOlen Pel 05/18/2014, 9:25 AM  Van Clines, PT  Acute Rehabilitation Services Pager [phone] Office [phone]

## 2014-05-18 NOTE — Progress Notes (Signed)
VASCULAR LAB PRELIMINARY  PRELIMINARY  PRELIMINARY  PRELIMINARY  Carotid Dopplers completed.    Preliminary report:  1-39% right ICA stenosis.  40-59% left ICA stenosis. Vertebral artery flow is antegrade.   Eimi Viney, RVT 05/18/2014, 3:53 PM

## 2014-05-19 DIAGNOSIS — R29810 Facial weakness: Secondary | ICD-10-CM | POA: Diagnosis not present

## 2014-05-19 DIAGNOSIS — R208 Other disturbances of skin sensation: Secondary | ICD-10-CM | POA: Diagnosis not present

## 2014-05-19 DIAGNOSIS — I10 Essential (primary) hypertension: Secondary | ICD-10-CM

## 2014-05-19 DIAGNOSIS — G510 Bell's palsy: Secondary | ICD-10-CM

## 2014-05-19 LAB — GLUCOSE, CAPILLARY
Glucose-Capillary: 136 mg/dL — ABNORMAL HIGH (ref 70–99)
Glucose-Capillary: 125 mg/dL — ABNORMAL HIGH (ref 70–99)
Glucose-Capillary: 85 mg/dL (ref 70–99)

## 2014-05-19 LAB — HEMOGLOBIN A1C
HEMOGLOBIN A1C: 6.5 % — AB (ref 4.8–5.6)
Mean Plasma Glucose: 140 mg/dL

## 2014-05-19 MED ORDER — CHLORTHALIDONE 25 MG PO TABS: 12.5000 mg | ORAL_TABLET | Freq: Every day | ORAL | Status: DC

## 2014-05-19 NOTE — Discharge Summary (Signed)
Physician Discharge Summary  Katherine Martin ZOX:[id number] DOB: 13-Jan-1946 DOA: 05/17/2014  PCP: Michiel Sites, MD  Admit date: 05/17/2014 Discharge date: 05/19/2014  Time spent: *25 minutes  Recommendations for Outpatient Follow-up:  1. 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 female   has a past medical history of Coronary artery disease, occlusive; PVD (peripheral vascular disease); Hypertension; Type 2 diabetes mellitus; Dyslipidemia; Hypothyroidism; Tobacco use; and Obesity (BMI 30.0-34.9).   Presented with  3 weeks ago patient had deep cleaning done for her teeth. Since then she had some some pain in the back of her head and some tooth ache and right jaw pain. 1.5 week she started to have numbness of the face mainly on the right. This was a mild sensation assocaited with some tingling.This did not affect her forehead. When patient got up this AM she felt lightheaded. She checked her BP and it was 175/95 this is higher than her usual. She checked her BG it was 211 also higher than usual. Patient noted that the water started to dribble out of the right side of her mouth. Patient called her daughter who noticed that her mouth was drooping to the right. This was at 10:30 AM. Patient presented to [facility] ER 12:27 PM. CT head was unremarkable. She was noted to have slight hyponatremia down to 133. Otherwise labs unremarkable. ER spoke to neurology who recommended transfer to [facility] for further evaluation. Denies any fever or chills, no chest pain, no facial pain. Patient denies any  trouble walking no arm or leg weakness Patietn reports hx of PVD with occasional leg pain she is followed by Dr. Allyson Sabal for this.    Hospital Course:  1.  Bells palsy- as per neuro  patient has Bell's palsy . Neurology following. MRI is negative for stroke. Carotid duplex 1-39% right ICA stenosis. 40-59% left ICA stenosis. Vertebral artery flow is antegrade. Echo showed EF 55-60% Continue aspirin 81 mg po daily. 2. Hypertension- BP mildly elevated, will also start chlorthalidone 12.5 mg [po daily 3. CAD- stable continue statin and aspirin 4. Diabetes mellitus- continue Actos  Procedures:  None  Consultations:  Neurology  Discharge Exam: Filed Vitals:   05/19/14 1027  BP: 161/64  Pulse: 65  Temp: 98.2 F (36.8 C)  Resp: 18    General: Appear in no acute distress Cardiovascular: S1S2 RRR Respiratory: Clear bilaterally  Discharge Instructions   Discharge Instructions    Diet - low sodium heart healthy    Complete by:  As directed      Increase activity slowly    Complete by:  As directed           Current Discharge Medication List    CONTINUE these medications which have NOT CHANGED   Details  ACTOS 15 MG tablet Take 1 tablet by mouth every morning.     aspirin 81 MG tablet Take 81 mg by mouth at bedtime.     atenolol-chlorthalidone (TENORETIC) 50-25 MG per tablet Take 0.5 tablets by mouth every evening.     nitroGLYCERIN (NITROSTAT)  0.4 MG SL tablet Place 0.4 mg under the tongue every 5 (five) minutes x 3 doses as needed for chest pain.    PAXIL CR 25 MG 24 hr tablet Take 50 mg by mouth every evening.     rosuvastatin (CRESTOR) 10 MG tablet Take 10 mg by mouth at bedtime.     SYNTHROID 125 MCG tablet Take 1 tablet by mouth daily before breakfast.     valsartan (DIOVAN) 160 MG tablet Take 160 mg by mouth every morning.        Allergies  Allergen Reactions  . Clindamycin/Lincomycin Anaphylaxis  . Buspar [Buspirone]   . Demerol [Meperidine] Hives  .  Effexor [Venlafaxine]   . Erythromycin Hives  . Levoxyl [Levothyroxine]   . Other Hives    peaches  . Penicillins Hives  . Prednisone Hives  . Sular [Nisoldipine Er]   . Zithromax [Azithromycin]   . Zoloft [Sertraline Hcl]       The results of significant diagnostics from this hospitalization (including imaging, microbiology, ancillary and laboratory) are listed below for reference.    Significant Diagnostic Studies: Ct Head Wo Contrast  05/17/2014   CLINICAL DATA:  Cardiac catheterization. Lightheadedness, dizziness.  EXAM: CT HEAD WITHOUT CONTRAST  TECHNIQUE: Contiguous axial images were obtained from the base of the skull through the vertex without intravenous contrast.  COMPARISON:  None.  FINDINGS: No acute intracranial hemorrhage. No focal mass lesion. No CT evidence of acute infarction. No midline shift or mass effect. No hydrocephalus. Basilar cisterns are patent. Paranasal sinuses and mastoid air cells are clear.  Atherosclerotic calcification of the vertebral arteries.  IMPRESSION: No acute intracranial findings.   Electronically Signed   By: Genevive Bi M.D.   On: 05/17/2014 17:17   Mri Brain Without Contrast  05/18/2014   CLINICAL DATA:  RIGHT facial numbness, weakness and headache. Symptoms for 3 weeks after ear dental cleaning. Worsening RIGHT ear and jaw pain for a few days. RIGHT facial weakness this morning. History of hypertension, diabetes and dyslipidemia.  EXAM: MRI HEAD WITHOUT CONTRAST  MRA HEAD WITHOUT CONTRAST  TECHNIQUE: Multiplanar, multiecho pulse sequences of the brain and surrounding structures were obtained without intravenous contrast. Angiographic images of the head were obtained using MRA technique without contrast.  COMPARISON:  CT of the head February 15, 2015  FINDINGS: MRI HEAD FINDINGS  The ventricles and sulci are normal for patient's age. No abnormal parenchymal signal, mass lesions, mass effect. No reduced diffusion to suggest acute ischemia. No  susceptibility artifact to suggest hemorrhage. Mild patchy supratentorial white matter T2 hyperintensities.  No abnormal extra-axial fluid collections. No extra-axial masses though, contrast enhanced sequences would be more sensitive. Normal major intracranial vascular flow voids seen at the skull base.  Ocular globes and orbital contents are unremarkable though not tailored for evaluation. No abnormal sellar expansion. Visualized paranasal sinuses and mastoid air cells are well-aerated. No suspicious calvarial bone marrow signal. No abnormal sellar expansion. Craniocervical junction maintained. 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  MRA HEAD FINDINGS  Moderately motion degraded examination.  Anterior circulation: Normal flow related enhancement of the included cervical, petrous, cavernous and supra clinoid internal carotid arteries. Patent anterior communicating artery. Normal flow related enhancement of the anterior and middle cerebral arteries, including more distal segments.  No large vessel occlusion. Motion degrades sensitivity for aneurysm, luminal irregularity. Mid-low grade stenosis.  Posterior circulation: Codominant vertebral arteries. Basilar artery is patent, with normal flow related enhancement of the main branch vessels. Normal flow related enhancement of the  posterior cerebral arteries.  IMPRESSION: MRI HEAD: No acute intracranial process, normal noncontrast MRI of the brain for age.  MRA HEAD: Moderately motion degraded examination without large vessel occlusion or definite high-grade stenosis.   Electronically Signed   By: Awilda Metroourtnay  Bloomer   On: 05/18/2014 02:11   Mr Maxine GlennMra Head/brain Wo Cm  05/18/2014   CLINICAL DATA:  RIGHT facial numbness, weakness and headache. Symptoms for 3 weeks after ear dental cleaning. Worsening RIGHT ear and jaw pain for a few days. RIGHT facial weakness this morning. History of hypertension, diabetes and dyslipidemia.  EXAM: MRI HEAD WITHOUT CONTRAST  MRA HEAD WITHOUT  CONTRAST  TECHNIQUE: Multiplanar, multiecho pulse sequences of the brain and surrounding structures were obtained without intravenous contrast. Angiographic images of the head were obtained using MRA technique without contrast.  COMPARISON:  CT of the head February 15, 2015  FINDINGS: MRI HEAD FINDINGS  The ventricles and sulci are normal for patient's age. No abnormal parenchymal signal, mass lesions, mass effect. No reduced diffusion to suggest acute ischemia. No susceptibility artifact to suggest hemorrhage. Mild patchy supratentorial white matter T2 hyperintensities.  No abnormal extra-axial fluid collections. No extra-axial masses though, contrast enhanced sequences would be more sensitive. Normal major intracranial vascular flow voids seen at the skull base.  Ocular globes and orbital contents are unremarkable though not tailored for evaluation. No abnormal sellar expansion. Visualized paranasal sinuses and mastoid air cells are well-aerated. No suspicious calvarial bone marrow signal. No abnormal sellar expansion. Craniocervical junction maintained. 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  MRA HEAD FINDINGS  Moderately motion degraded examination.  Anterior circulation: Normal flow related enhancement of the included cervical, petrous, cavernous and supra clinoid internal carotid arteries. Patent anterior communicating artery. Normal flow related enhancement of the anterior and middle cerebral arteries, including more distal segments.  No large vessel occlusion. Motion degrades sensitivity for aneurysm, luminal irregularity. Mid-low grade stenosis.  Posterior circulation: Codominant vertebral arteries. Basilar artery is patent, with normal flow related enhancement of the main branch vessels. Normal flow related enhancement of the posterior cerebral arteries.  IMPRESSION: MRI HEAD: No acute intracranial process, normal noncontrast MRI of the brain for age.  MRA HEAD: Moderately motion degraded examination without  large vessel occlusion or definite high-grade stenosis.   Electronically Signed   By: Awilda Metroourtnay  Bloomer   On: 05/18/2014 02:11    Microbiology: No results found for this or any previous visit (from the past 240 hour(s)).   Labs: Basic Metabolic Panel:  Recent Labs Lab 05/17/14 1309 05/17/14 1315  NA 133* 132*  K 3.9 3.9  CL 100 99  CO2  --  25  GLUCOSE 145* 141*  BUN 16 17  CREATININE 0.80 0.87  CALCIUM  --  9.0   Liver Function Tests: No results for input(s): AST, ALT, ALKPHOS, BILITOT, PROT, ALBUMIN in the last 168 hours. No results for input(s): LIPASE, AMYLASE in the last 168 hours. No results for input(s): AMMONIA in the last 168 hours. CBC:  Recent Labs Lab 05/17/14 1309 05/17/14 1315  WBC  --  9.7  NEUTROABS  --  7.9*  HGB 14.3 13.5  HCT 42.0 40.6  MCV  --  94.2  PLT  --  242   Cardiac Enzymes:  Recent Labs Lab 05/17/14 1315  TROPONINI <0.03   BNP: BNP (last 3 results) No results for input(s): BNP in the last 8760 hours.  ProBNP (last 3 results) No results for input(s): PROBNP in the last 8760 hours.  CBG:  Recent  Labs Lab 05/18/14 1135 05/18/14 1625 05/18/14 2132 05/19/14 0632 05/19/14 1126  GLUCAP 123* 106* 89 136* 125*       Signed:  Ryett Hamman S  [facility] 05/19/2014, 11:49 AM

## 2014-05-19 NOTE — Progress Notes (Signed)
OT Cancellation Note  Patient Details Name: Katherine Martin MRN: [id number] DOB: December 05, 1945   Cancelled Treatment:    Reason Eval/Treat Not Completed: OT screened, no needs identified, will sign off Pt at baseline. [facility] LLC[facility],HILLARY  Cantrell Larouche, OTR/L  [phone] 05/19/2014 05/19/2014, 4:09 PM

## 2014-05-19 NOTE — Progress Notes (Signed)
Subjective: Continues to have right facial droop and difficulty closing her right eye.  She states she has been having right ear discomfort for a couple weeks.  Having intermittent right posterior ear discomfort but currently no pain.   Objective: Current vital signs: BP 158/59 mmHg  Pulse 64  Temp(Src) 99.1 F (37.3 C) (Oral)  Resp 18  Ht  (1.575 m)  Wt 81.1 kg (178 lb 12.7 oz)  BMI 32.69 kg/m2  SpO2 97% Vital signs in last 24 hours: Temp:  [98.4 F (36.9 C)-99.1 F (37.3 C)] 99.1 F (37.3 C) (02/29 0629) Pulse Rate:  [64-66] 64 (02/29 0629) Resp:  [16-18] 18 (02/29 0629) BP: (117-162)/(43-62) 158/59 mmHg (02/29 0629) SpO2:  [96 %-100 %] 97 % (02/29 0629)  Intake/Output from previous day:   Intake/Output this shift:   Nutritional status: Diet Carb Modified  Neurologic Exam: Mental Status: Alert, oriented, thought content appropriate. Speech fluent without evidence of aphasia. Able to follow 3 step commands without difficulty. Cranial Nerves: II: Visual fields grossly normal, pupils equal, round, reactive to light and accommodation III,IV, VI: ptosis not present, extra-ocular motions intact bilaterally V,VII: smile asymmetric on the right due to weakness right orbicularis oris, right orbicularis oculi, and frontalis. Facial light touch sensation stated to be decreased on the right aspect of her nose only VIII: hearing normal bilaterally IX,X: gag reflex present XI: bilateral shoulder shrug XII: midline tongue extension without atrophy or fasciculations Motor: Right :Upper extremity 5/5Left: Upper extremity 5/5 Lower extremity 5/5Lower extremity 5/5 Tone and bulk:normal tone throughout; no atrophy noted Sensory: Pinprick and light touch intact throughout, bilaterally Deep Tendon Reflexes:  Right: Upper Extremity Left: Upper  extremity   biceps (C-5 to C-6) 2/4 biceps (C-5 to C-6) 2/4 tricep (C7) 2/4triceps (C7) 2/4 Brachioradialis (C6) 2/4Brachioradialis (C6) 2/4  Lower Extremity Lower Extremity  quadriceps (L-2 to L-4) 2/4 quadriceps (L-2 to L-4) 2/4 Achilles (S1) 2/4Achilles (S1) 2/4   Lab Results: Basic Metabolic Panel:  Recent Labs Lab 05/17/14 1309 05/17/14 1315  NA 133* 132*  K 3.9 3.9  CL 100 99  CO2  --  25  GLUCOSE 145* 141*  BUN 16 17  CREATININE 0.80 0.87  CALCIUM  --  9.0    Liver Function Tests: No results for input(s): AST, ALT, ALKPHOS, BILITOT, PROT, ALBUMIN in the last 168 hours. No results for input(s): LIPASE, AMYLASE in the last 168 hours. No results for input(s): AMMONIA in the last 168 hours.  CBC:  Recent Labs Lab 05/17/14 1309 05/17/14 1315  WBC  --  9.7  NEUTROABS  --  7.9*  HGB 14.3 13.5  HCT 42.0 40.6  MCV  --  94.2  PLT  --  242    Cardiac Enzymes:  Recent Labs Lab 05/17/14 1315  TROPONINI <0.03    Lipid Panel:  Recent Labs Lab 05/18/14 0505  CHOL 159  TRIG 155*  HDL 41  CHOLHDL 3.9  VLDL 31  LDLCALC 87    CBG:  Recent Labs Lab 05/18/14 0634 05/18/14 1135 05/18/14 1625 05/18/14 2132 05/19/14 0632  GLUCAP 134* 123* 106* 89 136*    Microbiology: No results found for this or any previous visit.  Coagulation Studies: No results for input(s): LABPROT, INR in the last 72 hours.  Imaging: Ct Head Wo Contrast  05/17/2014   CLINICAL DATA:  Cardiac catheterization. Lightheadedness, dizziness.  EXAM: CT HEAD WITHOUT CONTRAST  TECHNIQUE: Contiguous axial images were obtained from the base of the skull through the vertex  without intravenous contrast.  COMPARISON:  None.  FINDINGS: No acute intracranial hemorrhage. No focal mass lesion. No CT evidence of acute infarction. No midline shift or mass effect. No  hydrocephalus. Basilar cisterns are patent. Paranasal sinuses and mastoid air cells are clear.  Atherosclerotic calcification of the vertebral arteries.  IMPRESSION: No acute intracranial findings.   Electronically Signed   By: Genevive Bi M.D.   On: 05/17/2014 17:17   Mri Brain Without Contrast  05/18/2014   CLINICAL DATA:  RIGHT facial numbness, weakness and headache. Symptoms for 3 weeks after ear dental cleaning. Worsening RIGHT ear and jaw pain for a few days. RIGHT facial weakness this morning. History of hypertension, diabetes and dyslipidemia.  EXAM: MRI HEAD WITHOUT CONTRAST  MRA HEAD WITHOUT CONTRAST  TECHNIQUE: Multiplanar, multiecho pulse sequences of the brain and surrounding structures were obtained without intravenous contrast. Angiographic images of the head were obtained using MRA technique without contrast.  COMPARISON:  CT of the head February 15, 2015  FINDINGS: MRI HEAD FINDINGS  The ventricles and sulci are normal for patient's age. No abnormal parenchymal signal, mass lesions, mass effect. No reduced diffusion to suggest acute ischemia. No susceptibility artifact to suggest hemorrhage. Mild patchy supratentorial white matter T2 hyperintensities.  No abnormal extra-axial fluid collections. No extra-axial masses though, contrast enhanced sequences would be more sensitive. Normal major intracranial vascular flow voids seen at the skull base.  Ocular globes and orbital contents are unremarkable though not tailored for evaluation. No abnormal sellar expansion. Visualized paranasal sinuses and mastoid air cells are well-aerated. No suspicious calvarial bone marrow signal. No abnormal sellar expansion. Craniocervical junction maintained. 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  MRA HEAD FINDINGS  Moderately motion degraded examination.  Anterior circulation: Normal flow related enhancement of the included cervical, petrous, cavernous and supra clinoid internal carotid arteries. Patent anterior  communicating artery. Normal flow related enhancement of the anterior and middle cerebral arteries, including more distal segments.  No large vessel occlusion. Motion degrades sensitivity for aneurysm, luminal irregularity. Mid-low grade stenosis.  Posterior circulation: Codominant vertebral arteries. Basilar artery is patent, with normal flow related enhancement of the main branch vessels. Normal flow related enhancement of the posterior cerebral arteries.  IMPRESSION: MRI HEAD: No acute intracranial process, normal noncontrast MRI of the brain for age.  MRA HEAD: Moderately motion degraded examination without large vessel occlusion or definite high-grade stenosis.   Electronically Signed   By: Awilda Metro   On: 05/18/2014 02:11   Mr Maxine Glenn Head/brain Wo Cm  05/18/2014   CLINICAL DATA:  RIGHT facial numbness, weakness and headache. Symptoms for 3 weeks after ear dental cleaning. Worsening RIGHT ear and jaw pain for a few days. RIGHT facial weakness this morning. History of hypertension, diabetes and dyslipidemia.  EXAM: MRI HEAD WITHOUT CONTRAST  MRA HEAD WITHOUT CONTRAST  TECHNIQUE: Multiplanar, multiecho pulse sequences of the brain and surrounding structures were obtained without intravenous contrast. Angiographic images of the head were obtained using MRA technique without contrast.  COMPARISON:  CT of the head February 15, 2015  FINDINGS: MRI HEAD FINDINGS  The ventricles and sulci are normal for patient's age. No abnormal parenchymal signal, mass lesions, mass effect. No reduced diffusion to suggest acute ischemia. No susceptibility artifact to suggest hemorrhage. Mild patchy supratentorial white matter T2 hyperintensities.  No abnormal extra-axial fluid collections. No extra-axial masses though, contrast enhanced sequences would be more sensitive. Normal major intracranial vascular flow voids seen at the skull base.  Ocular globes and orbital contents are  unremarkable though not tailored for  evaluation. No abnormal sellar expansion. Visualized paranasal sinuses and mastoid air cells are well-aerated. No suspicious calvarial bone marrow signal. No abnormal sellar expansion. Craniocervical junction maintained. 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  MRA HEAD FINDINGS  Moderately motion degraded examination.  Anterior circulation: Normal flow related enhancement of the included cervical, petrous, cavernous and supra clinoid internal carotid arteries. Patent anterior communicating artery. Normal flow related enhancement of the anterior and middle cerebral arteries, including more distal segments.  No large vessel occlusion. Motion degrades sensitivity for aneurysm, luminal irregularity. Mid-low grade stenosis.  Posterior circulation: Codominant vertebral arteries. Basilar artery is patent, with normal flow related enhancement of the main branch vessels. Normal flow related enhancement of the posterior cerebral arteries.  IMPRESSION: MRI HEAD: No acute intracranial process, normal noncontrast MRI of the brain for age.  MRA HEAD: Moderately motion degraded examination without large vessel occlusion or definite high-grade stenosis.   Electronically Signed   By: Awilda Metroourtnay  Bloomer   On: 05/18/2014 02:11   VASCULAR LAB PRELIMINARY PRELIMINARY PRELIMINARY PRELIMINARY  Carotid Dopplers completed.   Preliminary report: 1-39% right ICA stenosis. 40-59% left ICA stenosis. Vertebral artery flow is antegrade.   2 D echo: Left ventricle: The cavity size was normal. Wall thickness was normal. Systolic function was normal. The estimated ejection fraction was in the range of 55% to 60%.  A1c -pending LDL 87  Medications:  Scheduled: . aspirin  325 mg Oral Daily  . atenolol  25 mg Oral Daily  . insulin aspart  0-15 Units Subcutaneous TID WC  . insulin aspart  0-5 Units Subcutaneous QHS  . irbesartan  150 mg Oral Daily  . levothyroxine  125 mcg Oral QAC breakfast  . PARoxetine  50 mg Oral QPM  .  rosuvastatin  10 mg Oral QHS    Assessment/Plan:  69 y.o. female with multiple risk factors for stroke, admitted due to a 3 weeks history of right face paresthesias, right ear discomfort and new onset right face weakness. Continues to have right facial droop, inability to close right eye fully, and wrinkle forehead on the right. MRI brain is negative for acute infarct, Carotid doppler shows no significant stenosis, Echo WNL. 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   Recommend: 1) typically recommend 5 days of 60 mg oral prednisone, followed by prednisone taper decreasing by 10 mg daily till off.    Neurology will S/O  Felicie MornDavid Tarris Delbene PA-C [facility] [phone]  05/19/2014, 9:12 AM

## 2014-05-19 NOTE — Progress Notes (Signed)
UR completed 

## 2014-05-19 NOTE — Progress Notes (Signed)
Met with patient to review discharge instructions and stroke prevention education. Reviewed list of medications, indications, and emphasized importance of adhering to med schedule. Pt is a smoker and expressed interest in smoking cessation program offered to [facility]. Advised of the many benefits to quitting. Pt. Verbalized understanding. Daughter provided transportation to patient's home in [place] upon discharge.

## 2014-05-24 DIAGNOSIS — R29810 Facial weakness: Secondary | ICD-10-CM | POA: Insufficient documentation

## 2014-06-08 NOTE — Progress Notes (Addendum)
Physical Therapy Note  (Late entry for G Code correction)    06/08/14 0800  PT G-Codes **NOT FOR INPATIENT CLASS**  Functional Assessment Tool Used Clinical Judgement  Functional Limitation Mobility: Walking and moving around  Mobility: Walking and Moving Around Current Status ([id number](G8978) CH  Mobility: Walking and Moving Around Goal Status ([id number](G8979) CH  Mobility: Walking and Moving Around Discharge Status [phone](G8980) CH   Van ClinesHolly Dantonio Justen, PT  Acute Rehabilitation Services Pager [phone] Office [phone]

## 2015-05-27 DIAGNOSIS — E119 Type 2 diabetes mellitus without complications: Secondary | ICD-10-CM | POA: Diagnosis not present

## 2015-06-01 DIAGNOSIS — I10 Essential (primary) hypertension: Secondary | ICD-10-CM | POA: Diagnosis not present

## 2015-06-01 DIAGNOSIS — E118 Type 2 diabetes mellitus with unspecified complications: Secondary | ICD-10-CM | POA: Diagnosis not present

## 2015-06-01 DIAGNOSIS — E789 Disorder of lipoprotein metabolism, unspecified: Secondary | ICD-10-CM | POA: Diagnosis not present

## 2015-06-01 DIAGNOSIS — E032 Hypothyroidism due to medicaments and other exogenous substances: Secondary | ICD-10-CM | POA: Diagnosis not present

## 2015-06-09 DIAGNOSIS — E118 Type 2 diabetes mellitus with unspecified complications: Secondary | ICD-10-CM | POA: Diagnosis not present

## 2015-06-09 DIAGNOSIS — E789 Disorder of lipoprotein metabolism, unspecified: Secondary | ICD-10-CM | POA: Diagnosis not present

## 2015-06-09 DIAGNOSIS — E032 Hypothyroidism due to medicaments and other exogenous substances: Secondary | ICD-10-CM | POA: Diagnosis not present

## 2015-06-09 DIAGNOSIS — I10 Essential (primary) hypertension: Secondary | ICD-10-CM | POA: Diagnosis not present

## 2015-07-09 ENCOUNTER — Observation Stay (HOSPITAL_COMMUNITY): Payer: Medicare Other

## 2015-07-09 ENCOUNTER — Encounter (HOSPITAL_COMMUNITY): Payer: Self-pay | Admitting: Emergency Medicine

## 2015-07-09 ENCOUNTER — Observation Stay (HOSPITAL_COMMUNITY)
Admission: EM | Admit: 2015-07-09 | Discharge: 2015-07-10 | Disposition: A | Discharge status: Home / Self Care | Payer: Medicare Other | Attending: Internal Medicine | Admitting: Internal Medicine

## 2015-07-09 DIAGNOSIS — Z79899 Other long term (current) drug therapy: Secondary | ICD-10-CM | POA: Insufficient documentation

## 2015-07-09 DIAGNOSIS — E039 Hypothyroidism, unspecified: Secondary | ICD-10-CM | POA: Diagnosis not present

## 2015-07-09 DIAGNOSIS — E669 Obesity, unspecified: Secondary | ICD-10-CM | POA: Insufficient documentation

## 2015-07-09 DIAGNOSIS — F329 Major depressive disorder, single episode, unspecified: Secondary | ICD-10-CM | POA: Diagnosis not present

## 2015-07-09 DIAGNOSIS — Z7982 Long term (current) use of aspirin: Secondary | ICD-10-CM | POA: Insufficient documentation

## 2015-07-09 DIAGNOSIS — Z720 Tobacco use: Secondary | ICD-10-CM | POA: Diagnosis not present

## 2015-07-09 DIAGNOSIS — R51 Headache: Secondary | ICD-10-CM | POA: Diagnosis not present

## 2015-07-09 DIAGNOSIS — F32A Depression, unspecified: Secondary | ICD-10-CM

## 2015-07-09 DIAGNOSIS — E118 Type 2 diabetes mellitus with unspecified complications: Secondary | ICD-10-CM

## 2015-07-09 DIAGNOSIS — E860 Dehydration: Secondary | ICD-10-CM | POA: Diagnosis not present

## 2015-07-09 DIAGNOSIS — R519 Headache, unspecified: Secondary | ICD-10-CM

## 2015-07-09 DIAGNOSIS — R2990 Unspecified symptoms and signs involving the nervous system: Secondary | ICD-10-CM

## 2015-07-09 DIAGNOSIS — R42 Dizziness and giddiness: Secondary | ICD-10-CM | POA: Diagnosis not present

## 2015-07-09 DIAGNOSIS — E785 Hyperlipidemia, unspecified: Secondary | ICD-10-CM | POA: Diagnosis not present

## 2015-07-09 DIAGNOSIS — E1151 Type 2 diabetes mellitus with diabetic peripheral angiopathy without gangrene: Secondary | ICD-10-CM | POA: Insufficient documentation

## 2015-07-09 DIAGNOSIS — I10 Essential (primary) hypertension: Secondary | ICD-10-CM | POA: Diagnosis not present

## 2015-07-09 DIAGNOSIS — I2510 Atherosclerotic heart disease of native coronary artery without angina pectoris: Secondary | ICD-10-CM | POA: Diagnosis not present

## 2015-07-09 DIAGNOSIS — R55 Syncope and collapse: Secondary | ICD-10-CM | POA: Diagnosis not present

## 2015-07-09 DIAGNOSIS — Z7984 Long term (current) use of oral hypoglycemic drugs: Secondary | ICD-10-CM | POA: Insufficient documentation

## 2015-07-09 DIAGNOSIS — Z6833 Body mass index (BMI) 33.0-33.9, adult: Secondary | ICD-10-CM | POA: Insufficient documentation

## 2015-07-09 DIAGNOSIS — F17210 Nicotine dependence, cigarettes, uncomplicated: Secondary | ICD-10-CM | POA: Insufficient documentation

## 2015-07-09 DIAGNOSIS — Z716 Tobacco abuse counseling: Secondary | ICD-10-CM

## 2015-07-09 DIAGNOSIS — R0989 Other specified symptoms and signs involving the circulatory and respiratory systems: Secondary | ICD-10-CM

## 2015-07-09 DIAGNOSIS — Z955 Presence of coronary angioplasty implant and graft: Secondary | ICD-10-CM | POA: Diagnosis not present

## 2015-07-09 DIAGNOSIS — R404 Transient alteration of awareness: Secondary | ICD-10-CM | POA: Diagnosis not present

## 2015-07-09 DIAGNOSIS — E119 Type 2 diabetes mellitus without complications: Secondary | ICD-10-CM

## 2015-07-09 HISTORY — DX: Other complications of anesthesia, initial encounter: T88.59XA

## 2015-07-09 HISTORY — DX: Anxiety disorder, unspecified: F41.9

## 2015-07-09 HISTORY — DX: Depression, unspecified: F32.A

## 2015-07-09 HISTORY — DX: Major depressive disorder, single episode, unspecified: F32.9

## 2015-07-09 HISTORY — DX: Headache, unspecified: R51.9

## 2015-07-09 HISTORY — DX: Nausea with vomiting, unspecified: R11.2

## 2015-07-09 HISTORY — DX: Nausea with vomiting, unspecified: Z98.890

## 2015-07-09 HISTORY — DX: Headache: R51

## 2015-07-09 HISTORY — DX: Adverse effect of unspecified anesthetic, initial encounter: T41.45XA

## 2015-07-09 LAB — BASIC METABOLIC PANEL
Anion gap: 13 (ref 5–15)
BUN: 14 mg/dL (ref 6–20)
CHLORIDE: 101 mmol/L (ref 101–111)
CO2: 21 mmol/L — ABNORMAL LOW (ref 22–32)
CREATININE: 0.85 mg/dL (ref 0.44–1.00)
Calcium: 9.2 mg/dL (ref 8.9–10.3)
GFR calc Af Amer: >60 mL/min (ref >60)
GFR calc non Af Amer: >60 mL/min (ref >60)
GLUCOSE: 141 mg/dL — AB (ref 65–99)
Potassium: 3.5 mmol/L (ref 3.5–5.1)
SODIUM: 135 mmol/L (ref 135–145)

## 2015-07-09 LAB — URINALYSIS, ROUTINE W REFLEX MICROSCOPIC
Bilirubin Urine: NEGATIVE
GLUCOSE, UA: NEGATIVE mg/dL
HGB URINE DIPSTICK: NEGATIVE
KETONES UR: NEGATIVE mg/dL
Leukocytes, UA: NEGATIVE
Nitrite: NEGATIVE
PH: 7.5 (ref 5.0–8.0)
PROTEIN: NEGATIVE mg/dL
Specific Gravity, Urine: 1.014 (ref 1.005–1.030)

## 2015-07-09 LAB — CBC
HCT: 38.4 % (ref 36.0–46.0)
Hemoglobin: 13.1 g/dL (ref 12.0–15.0)
MCH: 31.4 pg (ref 26.0–34.0)
MCHC: 34.1 g/dL (ref 30.0–36.0)
MCV: 92.1 fL (ref 78.0–100.0)
Platelets: 191 10*3/uL (ref 150–400)
RBC: 4.17 MIL/uL (ref 3.87–5.11)
RDW: 13.8 % (ref 11.5–15.5)
WBC: 9.5 10*3/uL (ref 4.0–10.5)

## 2015-07-09 LAB — TROPONIN I: Troponin I: <0.03 ng/mL (ref <0.031)

## 2015-07-09 LAB — CBG MONITORING, ED: Glucose-Capillary: 114 mg/dL — ABNORMAL HIGH (ref 65–99)

## 2015-07-09 MED ORDER — PAROXETINE HCL ER 25 MG PO TB24
50.0000 mg | ORAL_TABLET | Freq: Every evening | ORAL | Status: DC
  Administered 2015-07-09: 50 mg via ORAL
  Filled 2015-07-09 (×3): qty 2

## 2015-07-09 MED ORDER — ENOXAPARIN SODIUM 40 MG/0.4ML ~~LOC~~ SOLN
40.0000 mg | SUBCUTANEOUS | Status: DC
  Administered 2015-07-09: 40 mg via SUBCUTANEOUS
  Filled 2015-07-09: qty 0.4

## 2015-07-09 MED ORDER — ACETAMINOPHEN 650 MG RE SUPP: 650.0000 mg | Freq: Four times a day (QID) | RECTAL | Status: DC | PRN

## 2015-07-09 MED ORDER — SODIUM CHLORIDE 0.9% FLUSH
3.0000 mL | Freq: Two times a day (BID) | INTRAVENOUS | Status: DC
  Administered 2015-07-10: 3 mL via INTRAVENOUS

## 2015-07-09 MED ORDER — OXYCODONE HCL 5 MG PO TABS: 5.0000 mg | ORAL_TABLET | ORAL | Status: DC | PRN

## 2015-07-09 MED ORDER — ACETAMINOPHEN 325 MG PO TABS: 650.0000 mg | ORAL_TABLET | Freq: Four times a day (QID) | ORAL | Status: DC | PRN

## 2015-07-09 MED ORDER — ROSUVASTATIN CALCIUM 10 MG PO TABS
10.0000 mg | ORAL_TABLET | Freq: Every day | ORAL | Status: DC
  Administered 2015-07-09: 10 mg via ORAL
  Filled 2015-07-09: qty 1

## 2015-07-09 MED ORDER — ONDANSETRON HCL 4 MG/2ML IJ SOLN: 4.0000 mg | Freq: Four times a day (QID) | INTRAMUSCULAR | Status: DC | PRN

## 2015-07-09 MED ORDER — STROKE: EARLY STAGES OF RECOVERY BOOK
Freq: Once | Status: DC
  Filled 2015-07-09: qty 1

## 2015-07-09 MED ORDER — MECLIZINE HCL 25 MG PO TABS: 25.0000 mg | ORAL_TABLET | Freq: Three times a day (TID) | ORAL | Status: DC | PRN

## 2015-07-09 MED ORDER — INSULIN ASPART 100 UNIT/ML ~~LOC~~ SOLN: 0.0000 [IU] | Freq: Every day | SUBCUTANEOUS | Status: DC

## 2015-07-09 MED ORDER — SODIUM CHLORIDE 0.9 % IV BOLUS (SEPSIS)
1000.0000 mL | Freq: Once | INTRAVENOUS | Status: AC
  Administered 2015-07-09: 1000 mL via INTRAVENOUS

## 2015-07-09 MED ORDER — LEVOTHYROXINE SODIUM 125 MCG PO TABS
125.0000 ug | ORAL_TABLET | Freq: Every day | ORAL | Status: DC
  Administered 2015-07-10: 125 ug via ORAL
  Filled 2015-07-09 (×2): qty 1

## 2015-07-09 MED ORDER — INSULIN ASPART 100 UNIT/ML ~~LOC~~ SOLN: 0.0000 [IU] | Freq: Three times a day (TID) | SUBCUTANEOUS | Status: DC

## 2015-07-09 MED ORDER — SODIUM CHLORIDE 0.9 % IV SOLN
INTRAVENOUS | Status: DC
  Administered 2015-07-10: 02:00:00 via INTRAVENOUS

## 2015-07-09 MED ORDER — LORAZEPAM 2 MG/ML IJ SOLN
1.0000 mg | Freq: Once | INTRAMUSCULAR | Status: AC
  Administered 2015-07-09: 1 mg via INTRAVENOUS
  Filled 2015-07-09: qty 1

## 2015-07-09 MED ORDER — NICOTINE POLACRILEX 2 MG MT GUM
2.0000 mg | CHEWING_GUM | OROMUCOSAL | Status: DC | PRN
  Filled 2015-07-09: qty 1

## 2015-07-09 MED ORDER — ASPIRIN EC 81 MG PO TBEC
81.0000 mg | DELAYED_RELEASE_TABLET | Freq: Every day | ORAL | Status: DC
  Administered 2015-07-09: 81 mg via ORAL
  Filled 2015-07-09: qty 1

## 2015-07-09 MED ORDER — ONDANSETRON HCL 4 MG PO TABS: 4.0000 mg | ORAL_TABLET | Freq: Four times a day (QID) | ORAL | Status: DC | PRN

## 2015-07-09 NOTE — H&P (Addendum)
History and Physical    Katherine Martin ZOX:[id number] DOB: 06/14/1945 DOA: 07/09/2015  Referring MD/NP/PA: Dr. Bebe Shaggy - [facility] PCP: Michiel Sites, MD  Outpatient Specialists: Cardiology Patient coming from: Home  Chief Complaint: Headache, dizziness, weakness.  HPI: Katherine Martin is a 70 y.o. female with medical history significant of one pack per day smoker, CAD, status post catheterization with stent placement, diabetes, HLD, hypothyroidism, HTN presenting with acute onset constant headache with intermittent dizziness and weakness. Patient states that 2 days ago she had a headache that came on rather rapidly in the morning. This was associated with dizziness and fatigue/weakness. Dizziness and weakness seem to come and go. She feels unsteady on her feet. Episodes of dizziness and fatigue typically come on last approximately 30 minutes and are associated with additional feelings of nausea and diaphoresis. Patient states that she is also checked her blood pressure during this time and feels that it is been fairly high during his high as 154. Patient states that she also checks her sugar regularly and this is been well maintained between 102 and 150. Symptoms of dizziness can come on at random and are not associated with exertion or going from sitting to standing. Episode this morning of dizziness was associated with emesis. Denies any fevers, chest pain, shortness of breath, palpitations, neck stiffness, dysuria, frequency, back pain, rash, head trauma, vision loss, hearing loss.   ED Course: Patient given 1 L normal saline bolus. Patient states that this is improved her symptoms. Lab work fairly unremarkable.  Review of Systems: As per HPI otherwise 10 point review of systems negative.    Past Medical History  Diagnosis Date  . Coronary artery disease, occlusive     cath 2007  - RCA 100%, Severe distal Circumflex  . PVD (peripheral vascular disease) (HCC)     LAA Dopplers, November 2013:  ABIs--0.67 right, 0.65 left bilateral common iliacs less than 50% stenosis, bilateral SFA demonstrate exclusive disease with reconstitution in the proximal segment of bronchial arteries.) He artery occluded with 2 vessel runoff on the right, 3 vessel run off on left.  . Hypertension   . Type 2 diabetes mellitus (HCC)   . Dyslipidemia   . Hypothyroidism   . Tobacco use   . Obesity (BMI 30.0-34.9)     Past Surgical History  Procedure Laterality Date  . Cardiac catheterization  03/06/2006    100% RCA, Severe distal Circumflex stenosis, moderate LAD; EF ~60% with mild anterior hypokinesis. (Dr. Richarda Blade)  . Nm myoview ltd - treadmill  02/2009    Walk 6 minutes. No ischemia or infarction.  . Lower extremity arterial doppler  01/2012    bilateral ABI demonstrate moderate arterial insuff at rest; abd aorta with non-hemodynamically significant plaque; bilat CIA with equal/less than 50% diameter reduction; bilat SFA with occlusive disease & reconstiution in prox popliteal; R peroneal not visualized  . Lower extremity arterial doppler  September 2014    RABI: 0.55; LABI 0.53; moderate CEA, bilateral SFA-occlusive with reconstitution and proximal Popliteal; bilateral Peroneal artery occlusion.     reports that she has been smoking Cigarettes.  She has a 40 pack-year smoking history. She has quit using smokeless tobacco. She reports that she does not drink alcohol or use illicit drugs.  Allergies  Allergen Reactions  . Clindamycin/Lincomycin Anaphylaxis  . Buspar [Buspirone]   . Demerol [Meperidine] Hives  . Effexor [Venlafaxine]   . Erythromycin Hives  . Levoxyl [Levothyroxine]   . Other Hives    peaches  .  Penicillins Hives  . Prednisone Hives  . Sular [Nisoldipine Er]   . Zithromax [Azithromycin]   . Zoloft [Sertraline Hcl]     Family History  Problem Relation Age of Onset  . Heart attack Father 6536  . Heart failure Paternal Grandmother   . Heart attack Paternal Grandfather   .  Heart attack Brother     CABG  . Heart attack Sister     Prior to Admission medications   Medication Sig Start Date End Date Taking? Authorizing Provider  ACTOS 15 MG tablet Take 1 tablet by mouth every morning.  09/10/12  Yes Historical Provider, MD  aspirin 81 MG tablet Take 81 mg by mouth at bedtime.    Yes Historical Provider, MD  atenolol-chlorthalidone (TENORETIC) 50-25 MG per tablet Take 0.5 tablets by mouth every evening.  10/13/12  Yes Historical Provider, MD  calcium carbonate (TUMS - DOSED IN MG ELEMENTAL CALCIUM) 500 MG chewable tablet Chew 1 tablet by mouth daily as needed for indigestion or heartburn.   Yes Historical Provider, MD  nitroGLYCERIN (NITROSTAT) 0.4 MG SL tablet Place 0.4 mg under the tongue every 5 (five) minutes x 3 doses as needed for chest pain.   Yes Historical Provider, MD  PAXIL CR 25 MG 24 hr tablet Take 50 mg by mouth every evening.  10/02/12  Yes Historical Provider, MD  rosuvastatin (CRESTOR) 10 MG tablet Take 10 mg by mouth at bedtime.    Yes Historical Provider, MD  SYNTHROID 125 MCG tablet Take 1 tablet by mouth daily before breakfast.  10/10/12  Yes Historical Provider, MD  valsartan (DIOVAN) 160 MG tablet Take 160 mg by mouth every morning.    Yes Historical Provider, MD    Physical Exam: Filed Vitals:   07/09/15 1545 07/09/15 1615 07/09/15 1645 07/09/15 1730  BP: 134/58 129/83 145/62 154/56  Pulse: 64 68 64   Temp:      TempSrc:      Resp: 13 18 11 13   Height:      Weight:      SpO2: 95% 97% 96%       Constitutional: NAD, calm, comfortable Filed Vitals:   07/09/15 1545 07/09/15 1615 07/09/15 1645 07/09/15 1730  BP: 134/58 129/83 145/62 154/56  Pulse: 64 68 64   Temp:      TempSrc:      Resp: 13 18 11 13   Height:      Weight:      SpO2: 95% 97% 96%    Eyes: PERRL, lids and conjunctivae normal ENMT: Mucous membranes are moist. Posterior pharynx clear of any exudate or lesions.Normal dentition.  Neck: normal, supple, no masses, no  thyromegaly Respiratory: clear to auscultation bilaterally, no wheezing, no crackles. Normal respiratory effort. No accessory muscle use.  Cardiovascular: Regular rate and rhythm, no murmurs / rubs / gallops. 1+ lower extremity edema. 2+ pedal pulses. No carotid bruits.  Abdomen: no tenderness, no masses palpated. No hepatosplenomegaly. Bowel sounds positive.  Musculoskeletal: no clubbing / cyanosis. No joint deformity upper and lower extremities. Good ROM, no contractures. Normal muscle tone.  Skin: no rashes, lesions, ulcers. No induration Neurologic: Cranial nerves II through XII intact, 5 out of 5 strength in all 4 extremities, no dysmetria, proprioception normal, patient with truncal instability well trying to sit unassisted in bed.  Psychiatric: Normal judgment and insight. Alert and oriented x 3. Normal mood.   (Anything < 9 systems with 2 bullets each down codes to level 1) (If patient refuses exam can't  bill higher level) (Make sure to document decubitus ulcers present on admission -- if possible -- and whether patient has chronic indwelling catheter at time of admission)  Labs on Admission: I have personally reviewed following labs and imaging studies  CBC:  Recent Labs Lab 07/09/15 1050  WBC 9.5  HGB 13.1  HCT 38.4  MCV 92.1  PLT 191   Basic Metabolic Panel:  Recent Labs Lab 07/09/15 1050  NA 135  K 3.5  CL 101  CO2 21*  GLUCOSE 141*  BUN 14  CREATININE 0.85  CALCIUM 9.2   GFR: Estimated Creatinine Clearance: 62.1 mL/min (by C-G formula based on Cr of 0.85). Liver Function Tests: No results for input(s): AST, ALT, ALKPHOS, BILITOT, PROT, ALBUMIN in the last 168 hours. No results for input(s): LIPASE, AMYLASE in the last 168 hours. No results for input(s): AMMONIA in the last 168 hours. Coagulation Profile: No results for input(s): INR, PROTIME in the last 168 hours. Cardiac Enzymes:  Recent Labs Lab 07/09/15 1050  TROPONINI <0.03   BNP (last 3  results) No results for input(s): PROBNP in the last 8760 hours. HbA1C: No results for input(s): HGBA1C in the last 72 hours. CBG:  Recent Labs Lab 07/09/15 0945  GLUCAP 114*   Lipid Profile: No results for input(s): CHOL, HDL, LDLCALC, TRIG, CHOLHDL, LDLDIRECT in the last 72 hours. Thyroid Function Tests: No results for input(s): TSH, T4TOTAL, FREET4, T3FREE, THYROIDAB in the last 72 hours. Anemia Panel: No results for input(s): VITAMINB12, FOLATE, FERRITIN, TIBC, IRON, RETICCTPCT in the last 72 hours. Urine analysis:    Component Value Date/Time   COLORURINE YELLOW 07/09/2015 1135   APPEARANCEUR CLOUDY* 07/09/2015 1135   LABSPEC 1.014 07/09/2015 1135   PHURINE 7.5 07/09/2015 1135   GLUCOSEU NEGATIVE 07/09/2015 1135   HGBUR NEGATIVE 07/09/2015 1135   BILIRUBINUR NEGATIVE 07/09/2015 1135   KETONESUR NEGATIVE 07/09/2015 1135   PROTEINUR NEGATIVE 07/09/2015 1135   UROBILINOGEN 0.2 05/18/2014 0645   NITRITE NEGATIVE 07/09/2015 1135   LEUKOCYTESUR NEGATIVE 07/09/2015 1135   Sepsis Labs: (procalcitonin:4,lacticidven:4) )No results found for this or any previous visit (from the past 240 hour(s)).   Radiological Exams on Admission: No results found.   Assessment/Plan Active Problems:   Dyslipidemia   Hypertension   Tobacco abuse counseling   Near syncope   Headache   Diabetes mellitus with complication (HCC)   Tobacco use   Depression    Headache/dizziness/weakness: Symptoms may be due to a simple viral illness causing poor nutrition and dehydration versus stroke vs migraine w/ aura Patient with severe PVD and continues to smoke and has diabetes and high risk for CVA. ABCD 2 stroke score 5+.  - Telemetry - MRI/MRA - If + will need full stroke workup - IVF - Neuro checks - Meclizine  HTN: allow permissive HTN - Hydralazine PRN - restart valsartan and atenolol and chlorthalidone in am  DM: diet controlled. Last A1c 6.5 - A1c - SSI  HLD: - continue  crestor  Hypothyroid: - continue synthroid  Tobacco: 1ppd - NIcotine gum prn  Depression: - continue paxil   DVT prophylaxis: Lovenox Code Status: FULL Family Communication: None Disposition Plan: observation Consults called: none    Jayln Madeira J MD [facility]   If 7PM-7AM, please contact night-coverage [email or web address] Password [facility]  07/09/2015, 5:42 PM

## 2015-07-09 NOTE — ED Provider Notes (Signed)
CSN: [id number]     Arrival date & time 07/09/15  4098 History   First MD Initiated Contact with Patient 07/09/15 1008     Chief Complaint  Patient presents with  . Dizziness  . Near Syncope   Patient is a 70 y.o. female presenting with dizziness and near-syncope. The history is provided by the patient and a relative.  Dizziness Quality:  Lightheadedness Severity:  Moderate Onset quality:  Gradual Timing:  Constant Progression:  Worsening Chronicity:  New Relieved by:  Nothing Worsened by:  Nothing Associated symptoms: headaches and vomiting   Associated symptoms: no chest pain and no shortness of breath   Near Syncope This is a new problem. The current episode started less than 1 hour ago. The problem has been gradually improving. Associated symptoms include headaches. Pertinent negatives include no chest pain and no shortness of breath. Nothing aggravates the symptoms. Nothing relieves the symptoms.  Pt with h/o CAD who presents for dizziness and near syncope Per patient, for past week, she has felt fatigued and had dizziness She reports her BP has been elevating as well No new meds other than crestor earlier this month No new BP meds No change in BP meds No CP She reports HA but reports this is not new and is chronic Her daughter stayed with her last night, and today she felt as though she might pass out and had vomiting episode No active CP Did not have full LOC No seizure activity She now feels tired and fatigued   Past Medical History  Diagnosis Date  . Coronary artery disease, occlusive     cath 2007  - RCA 100%, Severe distal Circumflex  . PVD (peripheral vascular disease) (HCC)     LAA Dopplers, November 2013: ABIs--0.67 right, 0.65 left bilateral common iliacs less than 50% stenosis, bilateral SFA demonstrate exclusive disease with reconstitution in the proximal segment of bronchial arteries.) He artery occluded with 2 vessel runoff on the right, 3 vessel run off on  left.  . Hypertension   . Type 2 diabetes mellitus (HCC)   . Dyslipidemia   . Hypothyroidism   . Tobacco use   . Obesity (BMI 30.0-34.9)    Past Surgical History  Procedure Laterality Date  . Cardiac catheterization  03/06/2006    100% RCA, Severe distal Circumflex stenosis, moderate LAD; EF ~60% with mild anterior hypokinesis. (Dr. Richarda Blade)  . Nm myoview ltd - treadmill  02/2009    Walk 6 minutes. No ischemia or infarction.  . Lower extremity arterial doppler  01/2012    bilateral ABI demonstrate moderate arterial insuff at rest; abd aorta with non-hemodynamically significant plaque; bilat CIA with equal/less than 50% diameter reduction; bilat SFA with occlusive disease & reconstiution in prox popliteal; R peroneal not visualized  . Lower extremity arterial doppler  September 2014    RABI: 0.55; LABI 0.53; moderate CEA, bilateral SFA-occlusive with reconstitution and proximal Popliteal; bilateral Peroneal artery occlusion.   Family History  Problem Relation Age of Onset  . Heart attack Father 51  . Heart failure Paternal Grandmother   . Heart attack Paternal Grandfather   . Heart attack Brother     CABG  . Heart attack Sister    Social History  Substance Use Topics  . Smoking status: Current Every Day Smoker -- 1.00 packs/day for 40 years    Types: Cigarettes  . Smokeless tobacco: Former Neurosurgeon     Comment: She is trying the electronic cigarette.  . Alcohol Use: No  OB History    No data available     Review of Systems  Constitutional: Negative for fever.  Respiratory: Negative for shortness of breath.   Cardiovascular: Positive for near-syncope. Negative for chest pain.  Gastrointestinal: Positive for vomiting.  Neurological: Positive for dizziness and headaches. Negative for seizures.  All other systems reviewed and are negative.     Allergies  Clindamycin/lincomycin; Buspar; Demerol; Effexor; Erythromycin; Levoxyl; Other; Penicillins; Prednisone; Sular;  Zithromax; and Zoloft  Home Medications   Prior to Admission medications   Medication Sig Start Date End Date Taking? Authorizing Provider  ACTOS 15 MG tablet Take 1 tablet by mouth every morning.  09/10/12   Historical Provider, MD  aspirin 81 MG tablet Take 81 mg by mouth at bedtime.     Historical Provider, MD  atenolol-chlorthalidone (TENORETIC) 50-25 MG per tablet Take 0.5 tablets by mouth every evening.  10/13/12   Historical Provider, MD  nitroGLYCERIN (NITROSTAT) 0.4 MG SL tablet Place 0.4 mg under the tongue every 5 (five) minutes x 3 doses as needed for chest pain.    Historical Provider, MD  PAXIL CR 25 MG 24 hr tablet Take 50 mg by mouth every evening.  10/02/12   Historical Provider, MD  rosuvastatin (CRESTOR) 10 MG tablet Take 10 mg by mouth at bedtime.     Historical Provider, MD  SYNTHROID 125 MCG tablet Take 1 tablet by mouth daily before breakfast.  10/10/12   Historical Provider, MD  valsartan (DIOVAN) 160 MG tablet Take 160 mg by mouth every morning.     Historical Provider, MD   BP 152/55 mmHg  Pulse 55  Temp(Src) 98.5 F (36.9 C) (Oral)  Resp 17  Ht 5\' 1"  (1.549 m)  Wt 81.194 kg  BMI 33.84 kg/m2  SpO2 100% Physical Exam CONSTITUTIONAL: Well developed/well nourished HEAD: Normocephalic/atraumatic EYES: EOMI/PERRL ENMT: Mucous membranes moist NECK: supple no meningeal signs SPINE/BACK:entire spine nontender CV: S1/S2 noted, no murmurs/rubs/gallops noted LUNGS: Lungs are clear to auscultation bilaterally, no apparent distress ABDOMEN: soft, nontender, no rebound or guarding, bowel sounds noted throughout abdomen GU:no cva tenderness NEURO: Pt is awake/alert/appropriate, moves all extremitiesx4.  No facial droop. No arm or leg drift  EXTREMITIES: pulses normal/equal, full ROM SKIN: warm, color normal PSYCH: no abnormalities of mood noted, alert and oriented to situation  ED Course  Procedures  Medications  sodium chloride 0.9 % bolus 1,000 mL (0 mLs  Intravenous Stopped 07/09/15 1415)    Labs Reviewed  BASIC METABOLIC PANEL - Abnormal; Notable for the following:    CO2 21 (*)    Glucose, Bld 141 (*)    All other components within normal limits  URINALYSIS, ROUTINE W REFLEX MICROSCOPIC (NOT [facility]) - Abnormal; Notable for the following:    APPearance CLOUDY (*)    All other components within normal limits  CBG MONITORING, ED - Abnormal; Notable for the following:    Glucose-Capillary 114 (*)    All other components within normal limits  CBC  TROPONIN I  CBG MONITORING, ED    Labs Review Labs Reviewed  BASIC METABOLIC PANEL - Abnormal; Notable for the following:    CO2 21 (*)    Glucose, Bld 141 (*)    All other components within normal limits  URINALYSIS, ROUTINE W REFLEX MICROSCOPIC ([facility]) - Abnormal; Notable for the following:    APPearance CLOUDY (*)    All other components within normal limits  CBG MONITORING, ED - Abnormal; Notable for the following:  Glucose-Capillary 114 (*)    All other components within normal limits  CBC  TROPONIN I  CBG MONITORING, ED    I have personally reviewed and evaluated theselab results as part of my medical decision-making.   EKG Interpretation   Date/Time:  Thursday July 09 2015 09:50:08 EDT Ventricular Rate:  57 PR Interval:  168 QRS Duration: 85 QT Interval:  447 QTC Calculation: 435 R Axis:   59 Text Interpretation:  Sinus rhythm artifact noted Otherwise no significant  change Confirmed by Bebe Shaggy  MD, Dorinda Hill ([id number]) on 07/09/2015 10:14:55 AM  Also confirmed by Bebe Shaggy  MD, Jovan Colligan ([id number]), editor [place]  CCT,  BEVERLY ([id number])  on 07/09/2015 10:23:00 AM     3:09 PM Pt still feeling generalized weakness She had near syncopal episode this morning and has been feeling unwell all week She is not ataxic but still reports fatigue She had some episodes of hypotension in the ED (BP dropped to 90s) Pt has h/o CAD, would benefit from admission/monitoring and may  need echo to evaluate EF D/w dr Konrad Dolores for admission  MDM   Final diagnoses:  Near syncope  Dehydration  Labile blood pressure    Nursing notes including past medical history and social history reviewed and considered in documentation Labs/vital reviewed myself and considered during evaluation Previous records reviewed and considered - patient with h/o CAD     Zadie Rhine, MD 07/09/15 1511

## 2015-07-09 NOTE — ED Notes (Signed)
Attempted to call report

## 2015-07-09 NOTE — ED Notes (Signed)
Pt arrives from home via EMS reporting sudden onset of dizziness and weakness at the breakfast table, pt reports near syncope with 1 episode vomiting.  Pt denies LOC, CP. No focal deficits noted, pt NSR on monitor.  CBG 113.

## 2015-07-09 NOTE — ED Notes (Signed)
Pt ambulated with this RN to bathroom.  Pt denies dizziness. Gait even.

## 2015-07-10 DIAGNOSIS — R42 Dizziness and giddiness: Secondary | ICD-10-CM | POA: Diagnosis not present

## 2015-07-10 DIAGNOSIS — Z716 Tobacco abuse counseling: Secondary | ICD-10-CM

## 2015-07-10 DIAGNOSIS — E118 Type 2 diabetes mellitus with unspecified complications: Secondary | ICD-10-CM

## 2015-07-10 DIAGNOSIS — R55 Syncope and collapse: Secondary | ICD-10-CM | POA: Diagnosis not present

## 2015-07-10 DIAGNOSIS — E785 Hyperlipidemia, unspecified: Secondary | ICD-10-CM

## 2015-07-10 DIAGNOSIS — R51 Headache: Secondary | ICD-10-CM | POA: Diagnosis not present

## 2015-07-10 LAB — COMPREHENSIVE METABOLIC PANEL
ALK PHOS: 80 U/L (ref 38–126)
ALT: 13 U/L — AB (ref 14–54)
ANION GAP: 12 (ref 5–15)
AST: 19 U/L (ref 15–41)
Albumin: 3.4 g/dL — ABNORMAL LOW (ref 3.5–5.0)
BUN: 12 mg/dL (ref 6–20)
CALCIUM: 8.8 mg/dL — AB (ref 8.9–10.3)
CO2: 21 mmol/L — ABNORMAL LOW (ref 22–32)
CREATININE: 0.84 mg/dL (ref 0.44–1.00)
Chloride: 104 mmol/L (ref 101–111)
GFR calc non Af Amer: >60 mL/min (ref >60)
Glucose, Bld: 109 mg/dL — ABNORMAL HIGH (ref 65–99)
Potassium: 3.1 mmol/L — ABNORMAL LOW (ref 3.5–5.1)
Sodium: 137 mmol/L (ref 135–145)
Total Bilirubin: 0.5 mg/dL (ref 0.3–1.2)
Total Protein: 6.5 g/dL (ref 6.5–8.1)

## 2015-07-10 LAB — CBC
HEMATOCRIT: 37.1 % (ref 36.0–46.0)
HEMOGLOBIN: 12.6 g/dL (ref 12.0–15.0)
MCH: 31.5 pg (ref 26.0–34.0)
MCHC: 34 g/dL (ref 30.0–36.0)
MCV: 92.8 fL (ref 78.0–100.0)
Platelets: 204 10*3/uL (ref 150–400)
RBC: 4 MIL/uL (ref 3.87–5.11)
RDW: 13.7 % (ref 11.5–15.5)
WBC: 9.4 10*3/uL (ref 4.0–10.5)

## 2015-07-10 LAB — GLUCOSE, CAPILLARY
GLUCOSE-CAPILLARY: 102 mg/dL — AB (ref 65–99)
GLUCOSE-CAPILLARY: 120 mg/dL — AB (ref 65–99)
Glucose-Capillary: 92 mg/dL (ref 65–99)

## 2015-07-10 MED ORDER — IRBESARTAN 150 MG PO TABS
75.0000 mg | ORAL_TABLET | Freq: Every day | ORAL | Status: DC
  Administered 2015-07-10: 75 mg via ORAL
  Filled 2015-07-10: qty 1

## 2015-07-10 MED ORDER — MECLIZINE HCL 25 MG PO TABS: 25.0000 mg | ORAL_TABLET | Freq: Three times a day (TID) | ORAL | Status: DC | PRN

## 2015-07-10 MED ORDER — IRBESARTAN 75 MG PO TABS: 75.0000 mg | ORAL_TABLET | Freq: Every day | ORAL | Status: DC

## 2015-07-10 NOTE — Evaluation (Signed)
Occupational Therapy Evaluation Patient Details Name: Katherine Martin MRN: [id number] DOB: [birth year] Today's Date: 07/10/2015    History of Present Illness pt presents with Syncopal episode and 3 day hx of feeling lightheaded.  pt with hx of CAD, DM, HTN, and PVD.     Clinical Impression   Patient evaluated by Occupational Therapy with no further acute OT needs identified. All education has been completed and the patient has no further questions. (educated on fall risk with tub transfer and recommend tub seat but does not want at this time. Educated on using wall as a guide to help descend to floor at home if becoming dizzy to prevent injury from fall. Educated on always have cell phone on her person in case of fall or need for medical (A). Patient without cell phone present but encouraged her to learn how to program emergency phone numbers by holding down a single key on her phone) See below for any follow-up Occupational Therapy or equipment needs. OT to sign off. Thank you for referral.      Follow Up Recommendations  No OT follow up    Equipment Recommendations  None recommended by OT    Recommendations for Other Services       Precautions / Restrictions Precautions Precautions: Fall Restrictions Weight Bearing Restrictions: No      Mobility Bed Mobility               General bed mobility comments: in chair   Transfers Overall transfer level: Needs assistance Equipment used: None Transfers: Sit to/from Stand Sit to Stand: Modified independent (Device/Increase time)              Balance                                 Standardized Balance Assessment Standardized Balance Assessment : Berg Balance Test;Dynamic Gait Index   Dynamic Gait Index Level Surface: Mild Impairment Change in Gait Speed: Mild Impairment Gait with Horizontal Head Turns: Normal Gait with Vertical Head Turns: Normal Gait and Pivot Turn: Normal Step Over Obstacle:  Normal Step Around Obstacles: Normal Steps: Mild Impairment Total Score: 21      ADL Overall ADL's : At baseline;Independent                                             Vision     Perception     Praxis      Pertinent Vitals/Pain Pain Assessment: No/denies pain     Hand Dominance Right   Extremity/Trunk Assessment Upper Extremity Assessment Upper Extremity Assessment: Overall WFL for tasks assessed   Lower Extremity Assessment Lower Extremity Assessment: Overall WFL for tasks assessed   Cervical / Trunk Assessment Cervical / Trunk Assessment: Normal   Communication Communication Communication: No difficulties   Cognition Arousal/Alertness: Awake/alert Behavior During Therapy: WFL for tasks assessed/performed Overall Cognitive Status: Within Functional Limits for tasks assessed                     General Comments       Exercises       Shoulder Instructions      Home Living Family/patient expects to be discharged to:: Private residence Living Arrangements: Alone Available Help at Discharge: Family;Available PRN/intermittently Type of Home: House Home Access: Level entry  Home Layout: One level     Bathroom Shower/Tub: Chief Strategy OfficerTub/shower unit   Bathroom Toilet: Standard     Home Equipment: Hand held shower head;Grab bars - tub/shower   Additional Comments: daughter lives a block away with a walk in shower if needed      Prior Functioning/Environment Level of Independence: Independent             OT Diagnosis:     OT Problem List:     OT Treatment/Interventions:      OT Goals(Current goals can be found in the care plan section)    OT Frequency:     Barriers to D/C:            Co-evaluation              End of Session Equipment Utilized During Treatment: Gait belt Nurse Communication: Mobility status;Precautions  Activity Tolerance: Patient tolerated treatment well Patient left: in chair;with  call bell/phone within reach;with nursing/sitter in room   Time: 1030-1046 OT Time Calculation (min): 16 min Charges:  OT General Charges $OT Visit: 1 Procedure OT Evaluation $OT Eval Low Complexity: 1 Procedure G-Codes:    Harolyn RutherfordJones, Sharman Garrott B 07/10/2015, 1:43 PM   Mateo FlowJones, Brynn   OTR/L Pager: [phone]: [phone] Office: [phone] .

## 2015-07-10 NOTE — Discharge Instructions (Signed)
Follow with Dwan Bolt, MD in 5-7 days  Please get a complete blood count and chemistry panel checked by your Primary MD at your next visit, and again as instructed by your Primary MD. Please get your medications reviewed and adjusted by your Primary MD.  Please request your Primary MD to go over all Hospital Tests and Procedure/Radiological results at the follow up, please get all Hospital records sent to your Prim MD by signing hospital release before you go home.  If you had Pneumonia of Lung problems at the Hospital: Please get a 2 view Chest X ray done in 6-8 weeks after hospital discharge or sooner if instructed by your Primary MD.  If you have Congestive Heart Failure: Please call your Cardiologist or Primary MD anytime you have any of the following symptoms:  1) 3 pound weight gain in 24 hours or 5 pounds in 1 week  2) shortness of breath, with or without a dry hacking cough  3) swelling in the hands, feet or stomach  4) if you have to sleep on extra pillows at night in order to breathe  Follow cardiac low salt diet and 1.5 lit/day fluid restriction.  If you have diabetes Accuchecks 4 times/day, Once in AM empty stomach and then before each meal. Log in all results and show them to your primary doctor at your next visit. If any glucose reading is under 80 or above 300 call your primary MD immediately.  If you have Seizure/Convulsions/Epilepsy: Please do not drive, operate heavy machinery, participate in activities at heights or participate in high speed sports until you have seen by Primary MD or a Neurologist and advised to do so again.  If you had Gastrointestinal Bleeding: Please ask your Primary MD to check a complete blood count within one week of discharge or at your next visit. Your endoscopic/colonoscopic biopsies that are pending at the time of discharge, will also need to followed by your Primary MD.  Get Medicines reviewed and adjusted. Please take all your  medications with you for your next visit with your Primary MD  Please request your Primary MD to go over all hospital tests and procedure/radiological results at the follow up, please ask your Primary MD to get all Hospital records sent to his/her office.  If you experience worsening of your admission symptoms, develop shortness of breath, life threatening emergency, suicidal or homicidal thoughts you must seek medical attention immediately by calling 911 or calling your MD immediately  if symptoms less severe.  You must read complete instructions/literature along with all the possible adverse reactions/side effects for all the Medicines you take and that have been prescribed to you. Take any new Medicines after you have completely understood and accpet all the possible adverse reactions/side effects.   Do not drive or operate heavy machinery when taking Pain medications.   Do not take more than prescribed Pain, Sleep and Anxiety Medications  Special Instructions: If you have smoked or chewed Tobacco  in the last 2 yrs please stop smoking, stop any regular Alcohol  and or any Recreational drug use.  Wear Seat belts while driving.  Please note You were cared for by a hospitalist during your hospital stay. If you have any questions about your discharge medications or the care you received while you were in the hospital after you are discharged, you can call the unit and asked to speak with the hospitalist on call if the hospitalist that took care of you is not available. Once  you are discharged, your primary care physician will handle any further medical issues. Please note that NO REFILLS for any discharge medications will be authorized once you are discharged, as it is imperative that you return to your primary care physician (or establish a relationship with a primary care physician if you do not have one) for your aftercare needs so that they can reassess your need for medications and monitor your  lab values.  You can reach the hospitalist office at phone [phone] or fax [phone]   If you do not have a primary care physician, you can call [phone] for a physician referral.  Activity: As tolerated with Full fall precautions use walker/cane & assistance as needed  Diet: regular  Disposition Home

## 2015-07-10 NOTE — Evaluation (Signed)
Physical Therapy Evaluation Patient Details Name: Katherine Martin MRN: [id number] DOB: May 16, 1945 Today's Date: 07/10/2015   History of Present Illness  pt rpesents with Syncopal episode and 3 day hx of feeling lightheaded.  pt with hx of CAD, DM, HTN, and PVD.    Clinical Impression  Pt very motivated, but limited by feeling lightheaded.  Pt's BPs stable 128/78 sitting, 134/61 standing, and 136/60 after standing ~13mins.  Pt did indicate that she has not had her normal BP meds yet this morning and she normally requires meds for her BP to be 120s - 130s systolic.  Will continue to follow.      Follow Up Recommendations Home health PT;Supervision - Intermittent    Equipment Recommendations  Rolling walker with 5" wheels    Recommendations for Other Services       Precautions / Restrictions Precautions Precautions: Fall Restrictions Weight Bearing Restrictions: No      Mobility  Bed Mobility Overal bed mobility: Modified Independent             General bed mobility comments: pt able to complete without A or deficit.    Transfers Overall transfer level: Needs assistance Equipment used: None Transfers: Sit to/from Stand Sit to Stand: Supervision         General transfer comment: pt with definite use of UEs and noted mild sway which increased to moderate sway with increased time standing.    Ambulation/Gait Ambulation/Gait assistance: Min guard Ambulation Distance (Feet): 15 Feet Assistive device: None Gait Pattern/deviations: Step-through pattern;Decreased stride length     General Gait Details: pt mildly unsteady and with c/o feeling lightheaded.  BPs stable.    Stairs            Wheelchair Mobility    Modified Rankin (Stroke Patients Only)       Balance Overall balance assessment: Needs assistance Sitting-balance support: No upper extremity supported;Feet supported Sitting balance-Leahy Scale: Good     Standing balance support: No upper  extremity supported;During functional activity Standing balance-Leahy Scale: Fair                               Pertinent Vitals/Pain Pain Assessment: No/denies pain    Home Living Family/patient expects to be discharged to:: Private residence Living Arrangements: Alone Available Help at Discharge: Family;Available PRN/intermittently Type of Home: House Home Access: Level entry     Home Layout: One level Home Equipment: None      Prior Function Level of Independence: Independent               Hand Dominance        Extremity/Trunk Assessment   Upper Extremity Assessment: Overall WFL for tasks assessed           Lower Extremity Assessment: Overall WFL for tasks assessed      Cervical / Trunk Assessment: Normal  Communication   Communication: No difficulties  Cognition Arousal/Alertness: Awake/alert Behavior During Therapy: WFL for tasks assessed/performed Overall Cognitive Status: Within Functional Limits for tasks assessed                      General Comments      Exercises        Assessment/Plan    PT Assessment Patient needs continued PT services  PT Diagnosis Difficulty walking   PT Problem List Decreased activity tolerance;Decreased balance;Decreased mobility;Decreased knowledge of use of DME;Cardiopulmonary status limiting activity  PT Treatment Interventions  DME instruction;Gait training;Functional mobility training;Therapeutic activities;Therapeutic exercise;Balance training;Neuromuscular re-education;Patient/family education   PT Goals (Current goals can be found in the Care Plan section) Acute Rehab PT Goals Patient Stated Goal: Back to normal PT Goal Formulation: With patient Time For Goal Achievement: 07/24/15 Potential to Achieve Goals: Good    Frequency Min 3X/week   Barriers to discharge        Co-evaluation               End of Session Equipment Utilized During Treatment: Gait belt Activity  Tolerance: Treatment limited secondary to medical complications (Comment) (limited by feeling lightheaded) Patient left: in chair;with call bell/phone within reach Nurse Communication: Mobility status    Functional Assessment Tool Used: Clinical Judgement Functional Limitation: Mobility: Walking and moving around Mobility: Walking and Moving Around Current Status (Q6578(G8978): At least 1 percent but less than 20 percent impaired, limited or restricted Mobility: Walking and Moving Around Goal Status [phone](G8979): 0 percent impaired, limited or restricted    Time: [id number] PT Time Calculation (min) (ACUTE ONLY): 25 min   Charges:   PT Evaluation $PT Eval Moderate Complexity: 1 Procedure PT Treatments $Gait Training: 8-22 mins   PT G Codes:   PT G-Codes **NOT FOR INPATIENT CLASS** Functional Assessment Tool Used: Clinical Judgement Functional Limitation: Mobility: Walking and moving around Mobility: Walking and Moving Around Current Status (G4010(G8978): At least 1 percent but less than 20 percent impaired, limited or restricted Mobility: Walking and Moving Around Goal Status [phone](G8979): 0 percent impaired, limited or restricted    Sunny SchleinRitenour, Lumi Winslett F, [place] CarolinaPT [phone] 07/10/2015, 9:32 AM

## 2015-07-10 NOTE — Discharge Summary (Signed)
Physician Discharge Summary  Katherine Martin YNW:[id number] DOB: 29-Jul-1945 DOA: 07/09/2015  PCP: Michiel Sites, MD  Admit date: 07/09/2015 Discharge date: 07/10/2015  Time spent: > 30 minutes  Recommendations for Outpatient Follow-up:  1. Follow up with Dr. Juleen China in 2 weeks  Discharge Diagnoses:  Active Problems:   Dyslipidemia   Hypertension   Tobacco abuse counseling   Near syncope   Headache   Diabetes mellitus with complication (HCC)   Tobacco use   Depression   Discharge Condition: stable  Diet recommendation: regular  Filed Weights   07/09/15 0951  Weight: 81.194 kg (179 lb)    History of present illness:  Katherine Martin is a 70 y.o. female with medical history significant of one pack per day smoker, CAD, status post catheterization with stent placement, diabetes, HLD, hypothyroidism, HTN presenting with acute onset constant headache with intermittent dizziness and weakness. Patient states that 2 days ago she had a headache that came on rather rapidly in the morning. This was associated with dizziness and fatigue/weakness. Dizziness and weakness seem to come and go. She feels unsteady on her feet. Episodes of dizziness and fatigue typically come on last approximately 30 minutes and are associated with additional feelings of nausea and diaphoresis. Patient states that she is also checked her blood pressure during this time and feels that it is been fairly high during his high as 154. Patient states that she also checks her sugar regularly and this is been well maintained between 102 and 150. Symptoms of dizziness can come on at random and are not associated with exertion or going from sitting to standing. Episode this morning of dizziness was associated with emesis. Denies any fevers, chest pain, shortness of breath, palpitations, neck stiffness, dysuria, frequency, back pain, rash, head trauma, vision loss, hearing loss.   Hospital Course:  Headache/dizziness/weakness  - Symptoms may be due to a simple viral illness causing poor nutrition and dehydration. Patient has received IV fluids with improvement in her symptoms and she was feeling back to baseline. She underwent an MRI of the brain which is negative for acute findings. Her Diovan was held since patient felt that her symptoms appear right after she takes this medication and was placed on Avapro as this is nonformulary in the hospital as she was hypertensive. Since she had no symptoms with Avapro, and feeling strongly that the Diovan might be at fault, but this was switched at the time of discharge. She was feeling back to baseline, able to ambulate in the hallway, without any lightheadedness, dizziness or chest pain. She was discharged home in stable condition and was advised to have close outpatient follow-up with PCP.  HTN DM - diet controlled. Last A1c 6.5 HLD - continue crestor Hypothyroid - continue synthroid Tobacco - counseled for cessation  Depression - continue paxil  Procedures:  None    Consultations:  None   Discharge Exam: Filed Vitals:   07/10/15 0300 07/10/15 0505 07/10/15 1009 07/10/15 1302  BP: 135/49 176/72  154/59  Pulse: 65 74  82  Temp: 98.1 F (36.7 C) 97.9 F (36.6 C) 98.1 F (36.7 C) 97.7 F (36.5 C)  TempSrc: Oral Oral Oral Oral  Resp: 16 16 18 18   Height:      Weight:      SpO2: 96% 98% 99% 99%   General: NAD Cardiovascular: RRR Respiratory: CTA biL  Discharge Instructions Activity:  As tolerated   Get Medicines reviewed and adjusted: Please take all your medications with you for  your next visit with your Primary MD  Please request your Primary MD to go over all hospital tests and procedure/radiological results at the follow up, please ask your Primary MD to get all Hospital records sent to his/her office.  If you experience worsening of your admission symptoms, develop shortness of breath, life threatening emergency, suicidal or homicidal thoughts you  must seek medical attention immediately by calling 911 or calling your MD immediately if symptoms less severe.  You must read complete instructions/literature along with all the possible adverse reactions/side effects for all the Medicines you take and that have been prescribed to you. Take any new Medicines after you have completely understood and accpet all the possible adverse reactions/side effects.   Do not drive when taking Pain medications.   Do not take more than prescribed Pain, Sleep and Anxiety Medications  Special Instructions: If you have smoked or chewed Tobacco in the last 2 yrs please stop smoking, stop any regular Alcohol and or any Recreational drug use.  Wear Seat belts while driving.  Please note  You were cared for by a hospitalist during your hospital stay. Once you are discharged, your primary care physician will handle any further medical issues. Please note that NO REFILLS for any discharge medications will be authorized once you are discharged, as it is imperative that you return to your primary care physician (or establish a relationship with a primary care physician if you do not have one) for your aftercare needs so that they can reassess your need for medications and monitor your lab values.    Medication List    STOP taking these medications        valsartan 160 MG tablet  Commonly known as:  DIOVAN      TAKE these medications        ACTOS 15 MG tablet  Generic drug:  pioglitazone  Take 1 tablet by mouth every morning.     aspirin 81 MG tablet  Take 81 mg by mouth at bedtime.     atenolol-chlorthalidone 50-25 MG tablet  Commonly known as:  TENORETIC  Take 0.5 tablets by mouth every evening.     calcium carbonate 500 MG chewable tablet  Commonly known as:  TUMS - dosed in mg elemental calcium  Chew 1 tablet by mouth daily as needed for indigestion or heartburn.     irbesartan 75 MG tablet  Commonly known as:  AVAPRO  Take 1 tablet (75 mg  total) by mouth daily.     meclizine 25 MG tablet  Commonly known as:  ANTIVERT  Take 1 tablet (25 mg total) by mouth 3 (three) times daily as needed for dizziness.     nitroGLYCERIN 0.4 MG SL tablet  Commonly known as:  NITROSTAT  Place 0.4 mg under the tongue every 5 (five) minutes x 3 doses as needed for chest pain.     PAXIL CR 25 MG 24 hr tablet  Generic drug:  PARoxetine  Take 50 mg by mouth every evening.     rosuvastatin 10 MG tablet  Commonly known as:  CRESTOR  Take 10 mg by mouth at bedtime.     SYNTHROID 125 MCG tablet  Generic drug:  levothyroxine  Take 1 tablet by mouth daily before breakfast.           Follow-up Information    Follow up with Michiel Sites, MD. Schedule an appointment as soon as possible for a visit in 2 weeks.   Specialty:  Endocrinology  Contact information:   [address] [place] [id number] [phone]       The results of significant diagnostics from this hospitalization (including imaging, microbiology, ancillary and laboratory) are listed below for reference.    Significant Diagnostic Studies: Mr Shirlee LatchMra Head Wo Contrast  07/10/2015  CLINICAL DATA:  Acute onset constant headache, intermittent dizziness and weakness for 2 days. History of dyslipidemia, hypertension, diabetes. EXAM: MRI HEAD WITHOUT CONTRAST MRA HEAD WITHOUT CONTRAST MRA NECK WITHOUT CONTRAST TECHNIQUE: Multiplanar, multiecho pulse sequences of the brain and surrounding structures were obtained without intravenous contrast. Angiographic images of the Circle of Willis were obtained using MRA technique without intravenous contrast. Angiographic images of the neck were obtained using MRA technique without intravenous contrast. Carotid stenosis measurements (when applicable) are obtained utilizing NASCET criteria, using the distal internal carotid diameter as the denominator. COMPARISON:  MRI/MRA head May 18, 2014 FINDINGS: MRI HEAD FINDINGS  INTRACRANIAL CONTENTS: No reduced diffusion to suggest acute ischemia. No susceptibility artifact to suggest hemorrhage. The ventricles and sulci are normal for patient's age. A few scattered subcentimeter supratentorial white matter FLAIR T2 hyperintensities are less than expected for age, most commonly seen with chronic small vessel ischemic disease. No suspicious parenchymal signal, mass lesions, mass effect. No abnormal extra-axial fluid collections. No extra-axial masses though, contrast enhanced sequences would be more sensitive. Normal major intracranial vascular flow voids present at skull base. ORBITS: The included ocular globes and orbital contents are non-suspicious. SINUSES: The mastoid air-cells and included paranasal sinuses are well-aerated. SKULL/SOFT TISSUES: No abnormal sellar expansion. No suspicious calvarial bone marrow signal. Craniocervical junction maintained. 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 MRA HEAD FINDINGS Moderately motion degraded examination results in spurious duplicated appearance of the intracranial vessels. Internal carotid arteries, anterior and middle cerebral arteries are patent with normal flow related enhancement. Flow early enhancement of bilateral vertebral arteries, basilar artery and posterior cerebral arteries. Due to motion, limited assessment for stenosis, aneurysm or luminal irregularity. MRA NECK FINDINGS Limited mildly motion degraded, due to noncontrast technique, limited assessment arch vessels. Bilateral Common carotid arteries and internal carotid arteries demonstrate normal flow related enhancement without definite high-grade stenosis. Bilateral codominant vertebral arteries are patent. IMPRESSION: MRI HEAD: Negative MRI of the brain for age. MRA HEAD: Motion degraded examination without acute large vessel occlusion. MRA NECK: Limited motion degraded noncontrast evaluation without large vessel occlusion or, definite high-grade stenosis. Electronically Signed   By:  Awilda Metroourtnay  Bloomer M.D.   On: 07/10/2015 01:40   Mr Angiogram Neck Wo Contrast  07/10/2015  CLINICAL DATA:  Acute onset constant headache, intermittent dizziness and weakness for 2 days. History of dyslipidemia, hypertension, diabetes. EXAM: MRI HEAD WITHOUT CONTRAST MRA HEAD WITHOUT CONTRAST MRA NECK WITHOUT CONTRAST TECHNIQUE: Multiplanar, multiecho pulse sequences of the brain and surrounding structures were obtained without intravenous contrast. Angiographic images of the Circle of Willis were obtained using MRA technique without intravenous contrast. Angiographic images of the neck were obtained using MRA technique without intravenous contrast. Carotid stenosis measurements (when applicable) are obtained utilizing NASCET criteria, using the distal internal carotid diameter as the denominator. COMPARISON:  MRI/MRA head May 18, 2014 FINDINGS: MRI HEAD FINDINGS INTRACRANIAL CONTENTS: No reduced diffusion to suggest acute ischemia. No susceptibility artifact to suggest hemorrhage. The ventricles and sulci are normal for patient's age. A few scattered subcentimeter supratentorial white matter FLAIR T2 hyperintensities are less than expected for age, most commonly seen with chronic small vessel ischemic disease. No suspicious parenchymal signal, mass lesions, mass effect. No abnormal extra-axial  fluid collections. No extra-axial masses though, contrast enhanced sequences would be more sensitive. Normal major intracranial vascular flow voids present at skull base. ORBITS: The included ocular globes and orbital contents are non-suspicious. SINUSES: The mastoid air-cells and included paranasal sinuses are well-aerated. SKULL/SOFT TISSUES: No abnormal sellar expansion. No suspicious calvarial bone marrow signal. Craniocervical junction maintained. 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 MRA HEAD FINDINGS Moderately motion degraded examination results in spurious duplicated appearance of the intracranial vessels. Internal  carotid arteries, anterior and middle cerebral arteries are patent with normal flow related enhancement. Flow early enhancement of bilateral vertebral arteries, basilar artery and posterior cerebral arteries. Due to motion, limited assessment for stenosis, aneurysm or luminal irregularity. MRA NECK FINDINGS Limited mildly motion degraded, due to noncontrast technique, limited assessment arch vessels. Bilateral Common carotid arteries and internal carotid arteries demonstrate normal flow related enhancement without definite high-grade stenosis. Bilateral codominant vertebral arteries are patent. IMPRESSION: MRI HEAD: Negative MRI of the brain for age. MRA HEAD: Motion degraded examination without acute large vessel occlusion. MRA NECK: Limited motion degraded noncontrast evaluation without large vessel occlusion or, definite high-grade stenosis. Electronically Signed   By: Awilda Metro M.D.   On: 07/10/2015 01:40   Mr Brain Wo Contrast  07/10/2015  CLINICAL DATA:  Acute onset constant headache, intermittent dizziness and weakness for 2 days. History of dyslipidemia, hypertension, diabetes. EXAM: MRI HEAD WITHOUT CONTRAST MRA HEAD WITHOUT CONTRAST MRA NECK WITHOUT CONTRAST TECHNIQUE: Multiplanar, multiecho pulse sequences of the brain and surrounding structures were obtained without intravenous contrast. Angiographic images of the Circle of Willis were obtained using MRA technique without intravenous contrast. Angiographic images of the neck were obtained using MRA technique without intravenous contrast. Carotid stenosis measurements (when applicable) are obtained utilizing NASCET criteria, using the distal internal carotid diameter as the denominator. COMPARISON:  MRI/MRA head May 18, 2014 FINDINGS: MRI HEAD FINDINGS INTRACRANIAL CONTENTS: No reduced diffusion to suggest acute ischemia. No susceptibility artifact to suggest hemorrhage. The ventricles and sulci are normal for patient's age. A few  scattered subcentimeter supratentorial white matter FLAIR T2 hyperintensities are less than expected for age, most commonly seen with chronic small vessel ischemic disease. No suspicious parenchymal signal, mass lesions, mass effect. No abnormal extra-axial fluid collections. No extra-axial masses though, contrast enhanced sequences would be more sensitive. Normal major intracranial vascular flow voids present at skull base. ORBITS: The included ocular globes and orbital contents are non-suspicious. SINUSES: The mastoid air-cells and included paranasal sinuses are well-aerated. SKULL/SOFT TISSUES: No abnormal sellar expansion. No suspicious calvarial bone marrow signal. Craniocervical junction maintained. 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 MRA HEAD FINDINGS Moderately motion degraded examination results in spurious duplicated appearance of the intracranial vessels. Internal carotid arteries, anterior and middle cerebral arteries are patent with normal flow related enhancement. Flow early enhancement of bilateral vertebral arteries, basilar artery and posterior cerebral arteries. Due to motion, limited assessment for stenosis, aneurysm or luminal irregularity. MRA NECK FINDINGS Limited mildly motion degraded, due to noncontrast technique, limited assessment arch vessels. Bilateral Common carotid arteries and internal carotid arteries demonstrate normal flow related enhancement without definite high-grade stenosis. Bilateral codominant vertebral arteries are patent. IMPRESSION: MRI HEAD: Negative MRI of the brain for age. MRA HEAD: Motion degraded examination without acute large vessel occlusion. MRA NECK: Limited motion degraded noncontrast evaluation without large vessel occlusion or, definite high-grade stenosis. Electronically Signed   By: Awilda Metro M.D.   On: 07/10/2015 01:40    Microbiology: No results found for this or any previous visit (from the  past 240 hour(s)).   Labs: Basic Metabolic  Panel:  Recent Labs Lab 07/09/15 1050 07/10/15 0250  NA 135 137  K 3.5 3.1*  CL 101 104  CO2 21* 21*  GLUCOSE 141* 109*  BUN 14 12  CREATININE 0.85 0.84  CALCIUM 9.2 8.8*   Liver Function Tests:  Recent Labs Lab 07/10/15 0250  AST 19  ALT 13*  ALKPHOS 80  BILITOT 0.5  PROT 6.5  ALBUMIN 3.4*   CBC:  Recent Labs Lab 07/09/15 1050 07/10/15 0250  WBC 9.5 9.4  HGB 13.1 12.6  HCT 38.4 37.1  MCV 92.1 92.8  PLT 191 204   Cardiac Enzymes:  Recent Labs Lab 07/09/15 1050  TROPONINI <0.03   CBG:  Recent Labs Lab 07/09/15 0945 07/10/15 0140 07/10/15 0603 07/10/15 1107  GLUCAP 114* 102* 92 120*    Signed:  Alizon Schmeling  [facility] 07/10/2015, 1:22 PM

## 2015-07-10 NOTE — Progress Notes (Signed)
Pt in stable condition, went over dc instructions wt pt , paper prescriptions given to patient, they verbalised understanding, pt belongings at bedside, iv removed, cardiac monitor dcccmd notified, pt taken off the unit on a wheelchair.

## 2015-07-16 DIAGNOSIS — I10 Essential (primary) hypertension: Secondary | ICD-10-CM | POA: Diagnosis not present

## 2015-07-16 DIAGNOSIS — E118 Type 2 diabetes mellitus with unspecified complications: Secondary | ICD-10-CM | POA: Diagnosis not present

## 2015-07-16 DIAGNOSIS — E032 Hypothyroidism due to medicaments and other exogenous substances: Secondary | ICD-10-CM | POA: Diagnosis not present

## 2015-07-16 DIAGNOSIS — R42 Dizziness and giddiness: Secondary | ICD-10-CM | POA: Diagnosis not present

## 2015-08-03 DIAGNOSIS — E789 Disorder of lipoprotein metabolism, unspecified: Secondary | ICD-10-CM | POA: Diagnosis not present

## 2015-08-03 DIAGNOSIS — I10 Essential (primary) hypertension: Secondary | ICD-10-CM | POA: Diagnosis not present

## 2015-08-03 DIAGNOSIS — E032 Hypothyroidism due to medicaments and other exogenous substances: Secondary | ICD-10-CM | POA: Diagnosis not present

## 2015-08-19 DIAGNOSIS — E789 Disorder of lipoprotein metabolism, unspecified: Secondary | ICD-10-CM | POA: Diagnosis not present

## 2015-08-19 DIAGNOSIS — I10 Essential (primary) hypertension: Secondary | ICD-10-CM | POA: Diagnosis not present

## 2015-08-19 DIAGNOSIS — E032 Hypothyroidism due to medicaments and other exogenous substances: Secondary | ICD-10-CM | POA: Diagnosis not present

## 2015-08-30 DIAGNOSIS — R531 Weakness: Secondary | ICD-10-CM | POA: Diagnosis not present

## 2015-08-30 DIAGNOSIS — R404 Transient alteration of awareness: Secondary | ICD-10-CM | POA: Diagnosis not present

## 2015-08-31 ENCOUNTER — Emergency Department (HOSPITAL_COMMUNITY)
Admission: EM | Admit: 2015-08-31 | Discharge: 2015-08-31 | Disposition: A | Discharge status: Home / Self Care | Payer: Medicare Other | Attending: Emergency Medicine | Admitting: Emergency Medicine

## 2015-08-31 ENCOUNTER — Encounter (HOSPITAL_COMMUNITY): Payer: Self-pay | Admitting: Emergency Medicine

## 2015-08-31 DIAGNOSIS — Z79899 Other long term (current) drug therapy: Secondary | ICD-10-CM | POA: Insufficient documentation

## 2015-08-31 DIAGNOSIS — R531 Weakness: Secondary | ICD-10-CM | POA: Diagnosis not present

## 2015-08-31 DIAGNOSIS — E785 Hyperlipidemia, unspecified: Secondary | ICD-10-CM | POA: Insufficient documentation

## 2015-08-31 DIAGNOSIS — E039 Hypothyroidism, unspecified: Secondary | ICD-10-CM | POA: Insufficient documentation

## 2015-08-31 DIAGNOSIS — I10 Essential (primary) hypertension: Secondary | ICD-10-CM | POA: Diagnosis not present

## 2015-08-31 DIAGNOSIS — F17210 Nicotine dependence, cigarettes, uncomplicated: Secondary | ICD-10-CM | POA: Insufficient documentation

## 2015-08-31 DIAGNOSIS — Z7982 Long term (current) use of aspirin: Secondary | ICD-10-CM | POA: Diagnosis not present

## 2015-08-31 DIAGNOSIS — E871 Hypo-osmolality and hyponatremia: Secondary | ICD-10-CM | POA: Diagnosis not present

## 2015-08-31 DIAGNOSIS — I2510 Atherosclerotic heart disease of native coronary artery without angina pectoris: Secondary | ICD-10-CM | POA: Diagnosis not present

## 2015-08-31 DIAGNOSIS — E119 Type 2 diabetes mellitus without complications: Secondary | ICD-10-CM | POA: Insufficient documentation

## 2015-08-31 DIAGNOSIS — E1151 Type 2 diabetes mellitus with diabetic peripheral angiopathy without gangrene: Secondary | ICD-10-CM | POA: Insufficient documentation

## 2015-08-31 DIAGNOSIS — F329 Major depressive disorder, single episode, unspecified: Secondary | ICD-10-CM | POA: Diagnosis not present

## 2015-08-31 LAB — CBC WITH DIFFERENTIAL/PLATELET
BASOS ABS: 0 10*3/uL (ref 0.0–0.1)
Basophils Relative: 0 %
EOS ABS: 0.1 10*3/uL (ref 0.0–0.7)
EOS PCT: 1 %
HCT: 41.7 % (ref 36.0–46.0)
HEMOGLOBIN: 13.1 g/dL (ref 12.0–15.0)
LYMPHS ABS: 1.3 10*3/uL (ref 0.7–4.0)
Lymphocytes Relative: 12 %
MCH: 31.2 pg (ref 26.0–34.0)
MCHC: 31.4 g/dL (ref 30.0–36.0)
MCV: 99.3 fL (ref 78.0–100.0)
MONOS PCT: 7 %
Monocytes Absolute: 0.8 10*3/uL (ref 0.1–1.0)
NEUTROS ABS: 8.8 10*3/uL — AB (ref 1.7–7.7)
Neutrophils Relative %: 80 %
Platelets: 282 10*3/uL (ref 150–400)
RBC: 4.2 MIL/uL (ref 3.87–5.11)
RDW: 22.3 % — AB (ref 11.5–15.5)
WBC: 11 10*3/uL — ABNORMAL HIGH (ref 4.0–10.5)

## 2015-08-31 LAB — COMPREHENSIVE METABOLIC PANEL
ALK PHOS: 78 U/L (ref 38–126)
ALT: 13 U/L — ABNORMAL LOW (ref 14–54)
ANION GAP: 12 (ref 5–15)
AST: 17 U/L (ref 15–41)
Albumin: 3.8 g/dL (ref 3.5–5.0)
BILIRUBIN TOTAL: 0.4 mg/dL (ref 0.3–1.2)
BUN: 14 mg/dL (ref 6–20)
CALCIUM: 9.5 mg/dL (ref 8.9–10.3)
CO2: 21 mmol/L — ABNORMAL LOW (ref 22–32)
Chloride: 96 mmol/L — ABNORMAL LOW (ref 101–111)
Creatinine, Ser: 0.84 mg/dL (ref 0.44–1.00)
GFR calc non Af Amer: >60 mL/min (ref >60)
Glucose, Bld: 117 mg/dL — ABNORMAL HIGH (ref 65–99)
Potassium: 3.7 mmol/L (ref 3.5–5.1)
SODIUM: 129 mmol/L — AB (ref 135–145)
TOTAL PROTEIN: 6.7 g/dL (ref 6.5–8.1)

## 2015-08-31 LAB — URINE MICROSCOPIC-ADD ON

## 2015-08-31 LAB — URINALYSIS, ROUTINE W REFLEX MICROSCOPIC
BILIRUBIN URINE: NEGATIVE
Glucose, UA: NEGATIVE mg/dL
Ketones, ur: NEGATIVE mg/dL
Leukocytes, UA: NEGATIVE
Nitrite: NEGATIVE
PH: 7 (ref 5.0–8.0)
Protein, ur: NEGATIVE mg/dL
SPECIFIC GRAVITY, URINE: 1.008 (ref 1.005–1.030)

## 2015-08-31 LAB — TROPONIN I: Troponin I: <0.03 ng/mL (ref <0.031)

## 2015-08-31 MED ORDER — SODIUM CHLORIDE 0.9 % IV BOLUS (SEPSIS)
1000.0000 mL | Freq: Once | INTRAVENOUS | Status: AC
  Administered 2015-08-31: 1000 mL via INTRAVENOUS

## 2015-08-31 NOTE — ED Notes (Signed)
Pt departed in NAD.  

## 2015-08-31 NOTE — ED Notes (Signed)
Patient states interetimittant nausea, weakness and headaches for 4 days. MD stopped simvastatin and started Crestor. 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 Denies vomit/diarrhea. C/O unsteady gait. Lives alone. EKG showed NSR. EMS vitals HR 53, 96% room air, 155/56, CBG 128. Daughter states patient is pale.

## 2015-08-31 NOTE — ED Provider Notes (Signed)
CSN: [id number]     Arrival date & time 08/31/15  0012 History  By signing my name below, I, Arianna Nassar, attest that this documentation has been prepared under the direction and in the presence of Shon Baton, MD.  Electronically Signed: Octavia Heir, ED Scribe. 08/31/2015. 12:46 AM.    Chief Complaint  Patient presents with  . Weakness  . Nausea     The history is provided by the patient. No language interpreter was used.   HPI Comments: Katherine Martin is a 70 y.o. female who has a PMHx of CAD, PVD, HTN, DLD, hypothyroidism, and DMII presents to the Emergency Department complaining of sudden, gradual worsening HTN onset this evening. Pt notes feeling weak and lightheaded that has been going on for the past two months. She has associated acute onset headaches and mild diaphoresis. She notes she has not been feeling well for the past few days. She notes taking her blood pressure which was 183/85 which is higher than her normal blood pressure. Pt was on simvastatin and crestor but her PCP took her off of both after being evaluated in April for the same symptoms. She currently takes 80 mg of valsartan. Pt denies fever, leg swelling, shortness of breath.   PCP: Dr. Darci Needle Past Medical History  Diagnosis Date  . Coronary artery disease, occlusive     cath 2007  - RCA 100%, Severe distal Circumflex  . PVD (peripheral vascular disease) (HCC)     LAA Dopplers, November 2013: ABIs--0.67 right, 0.65 left bilateral common iliacs less than 50% stenosis, bilateral SFA demonstrate exclusive disease with reconstitution in the proximal segment of bronchial arteries.) He artery occluded with 2 vessel runoff on the right, 3 vessel run off on left.  . Hypertension   . Dyslipidemia   . Hypothyroidism   . Tobacco use   . Obesity (BMI 30.0-34.9)   . Complication of anesthesia 1990s    "had trouble waking me up"  . PONV (postoperative nausea and vomiting)   . Type 2 diabetes mellitus (HCC)    . Daily headache     "last couple weeks" (07/09/2015)  . Anxiety   . Depression    Past Surgical History  Procedure Laterality Date  . Nm myoview ltd - treadmill  02/2009    Walk 6 minutes. No ischemia or infarction.  . Lower extremity arterial doppler  01/2012    bilateral ABI demonstrate moderate arterial insuff at rest; abd aorta with non-hemodynamically significant plaque; bilat CIA with equal/less than 50% diameter reduction; bilat SFA with occlusive disease & reconstiution in prox popliteal; R peroneal not visualized  . Lower extremity arterial doppler  September 2014    RABI: 0.55; LABI 0.53; moderate CEA, bilateral SFA-occlusive with reconstitution and proximal Popliteal; bilateral Peroneal artery occlusion.  . Tonsillectomy    . Cholecystectomy open    . Dilation and curettage of uterus    . Cardiac catheterization  03/06/2006    100% RCA, Severe distal Circumflex stenosis, moderate LAD; EF ~60% with mild anterior hypokinesis. (Dr. Richarda Blade)   Family History  Problem Relation Age of Onset  . Heart attack Father 63  . Heart failure Paternal Grandmother   . Heart attack Paternal Grandfather   . Heart attack Brother     CABG  . Heart attack Sister    Social History  Substance Use Topics  . Smoking status: Current Every Day Smoker -- 1.00 packs/day for 50 years    Types: Cigarettes  . Smokeless  tobacco: Never Used  . Alcohol Use: No   OB History    No data available     Review of Systems  Constitutional: Negative for fever.  Respiratory: Negative for shortness of breath.   Cardiovascular: Negative for leg swelling.  Neurological: Positive for dizziness, weakness and light-headedness.  All other systems reviewed and are negative.     Allergies  Clindamycin/lincomycin; Buspar; Crestor; Demerol; Effexor; Erythromycin; Levoxyl; Other; Penicillins; Prednisone; Sular; Zithromax; and Zoloft  Home Medications   Prior to Admission medications   Medication Sig  Start Date End Date Taking? Authorizing Provider  ACTOS 15 MG tablet Take 1 tablet by mouth every morning.  09/10/12  Yes Historical Provider, MD  aspirin 81 MG tablet Take 81 mg by mouth at bedtime.    Yes Historical Provider, MD  atenolol-chlorthalidone (TENORETIC) 50-25 MG per tablet Take 0.5 tablets by mouth every evening.  10/13/12  Yes Historical Provider, MD  calcium carbonate (TUMS - DOSED IN MG ELEMENTAL CALCIUM) 500 MG chewable tablet Chew 1 tablet by mouth daily as needed for indigestion or heartburn.   Yes Historical Provider, MD  meclizine (ANTIVERT) 25 MG tablet Take 1 tablet (25 mg total) by mouth 3 (three) times daily as needed for dizziness. 07/10/15  Yes Costin Otelia SergeantM Gherghe, MD  nitroGLYCERIN (NITROSTAT) 0.4 MG SL tablet Place 0.4 mg under the tongue every 5 (five) minutes x 3 doses as needed for chest pain.   Yes Historical Provider, MD  PAXIL CR 25 MG 24 hr tablet Take 50 mg by mouth every evening.  10/02/12  Yes Historical Provider, MD  SYNTHROID 125 MCG tablet Take 1 tablet by mouth daily before breakfast.  10/10/12  Yes Historical Provider, MD  valsartan (DIOVAN) 80 MG tablet Take 80 mg by mouth 2 (two) times daily.   Yes Historical Provider, MD   Triage vitals: BP 166/59 mmHg  Pulse 57  Temp(Src) 98.1 F (36.7 C) (Oral)  Resp 12  SpO2 98% Physical Exam  Constitutional: She is oriented to person, place, and time.  Elderly, no acute distress, overweight  HENT:  Head: Normocephalic and atraumatic.  Mouth/Throat: Oropharynx is clear and moist.  Eyes: Pupils are equal, round, and reactive to light.  Cardiovascular: Normal rate, regular rhythm and normal heart sounds.   No murmur heard. Pulmonary/Chest: Effort normal and breath sounds normal. No respiratory distress. She has no wheezes.  Abdominal: Soft. Bowel sounds are normal. There is no tenderness. There is no rebound.  Musculoskeletal:  Trace bilateral lower extremity edema  Neurological: She is alert and oriented to  person, place, and time.  Cranial nerves II through XII intact, 5 out of 5 strength in all 4 extremities  Skin: Skin is warm and dry.  Psychiatric: She has a normal mood and affect.  Nursing note and vitals reviewed.   ED Course  Procedures  DIAGNOSTIC STUDIES: Oxygen Saturation is 98% on RA, normal by my interpretation.  COORDINATION OF CARE:  12:45 AM Discussed treatment plan which includes lab work with pt at bedside and pt agreed to plan.  Labs Review Labs Reviewed  CBC WITH DIFFERENTIAL/PLATELET - Abnormal; Notable for the following:    WBC 11.0 (*)    RDW 22.3 (*)    Neutro Abs 8.8 (*)    All other components within normal limits  URINALYSIS, ROUTINE W REFLEX MICROSCOPIC (NOT AT [facility]) - Abnormal; Notable for the following:    APPearance CLOUDY (*)    Hgb urine dipstick TRACE (*)  All other components within normal limits  COMPREHENSIVE METABOLIC PANEL - Abnormal; Notable for the following:    Sodium 129 (*)    Chloride 96 (*)    CO2 21 (*)    Glucose, Bld 117 (*)    ALT 13 (*)    All other components within normal limits  URINE MICROSCOPIC-ADD ON - Abnormal; Notable for the following:    Squamous Epithelial / LPF 6-30 (*)    Bacteria, UA RARE (*)    All other components within normal limits  TROPONIN I    Imaging Review No results found. I have personally reviewed and evaluated these images and lab results as part of my medical decision-making.   EKG Interpretation   Date/Time:  Monday August 31 2015 00:19:21 EDT Ventricular Rate:  56 PR Interval:  141 QRS Duration: 91 QT Interval:  431 QTC Calculation: 416 R Axis:   19 Text Interpretation:  Sinus rhythm Confirmed by Patina Spanier  MD, Nicholas Ossa  ([id number]) on 08/31/2015 1:09:01 AM      MDM   Final diagnoses:  Hyponatremia  Weakness   Patient presents with generalized weakness, lightheadedness, and headaches. Reports waxing and waning symptoms over the last 2 months since initiation of her cholesterol  medication. Tonight she noted her blood pressure to be high at home and got concerned. These symptoms are not new for her but in the setting of high blood pressure this made her concerned. Blood pressure here is 166/59. Improved to 133/54. She is nonfocal on exam. Otherwise her vital signs are reassuring. She is not orthostatic. EKG shows sinus rhythm. She was noted to be in the mid 75s with her heart rate. She is on atenolol. This can make people feel poorly and near syncopal. She may need adjustment in her atenolol. I discussed this with the patient. Otherwise lab work notable for mild hyponatremia. She was given fluids with improvement of her symptoms. She was able to ambulate and tolerate fluids without difficulty. EKG and lab work otherwise reassuring. With improvement of blood pressure, feel patient is safe for discharge home. Given chronicity of symptoms, doubt acute emergent process at this time.  After history, exam, and medical workup I feel the patient has been appropriately medically screened and is safe for discharge home. Pertinent diagnoses were discussed with the patient. Patient was given return precautions.  I personally performed the services described in this documentation, which was scribed in my presence. The recorded information has been reviewed and is accurate.   Shon Baton, MD 08/31/15 0330

## 2015-08-31 NOTE — Discharge Instructions (Signed)
You were seen today for generalized weakness and dizziness. Your sodium was mildly reduced. You were given fluids. The rest. Workup is reassuring. It was noted that your heart rate was in the 50s to 60s. Sometimes people can feel poorly with her heart rate is mildly bradycardic. You are on a medication that lowers her heart rate.  Follow-up with your primary physician for adjustment of medication.  Hyponatremia Hyponatremia is when the amount of salt (sodium) in your blood is too low. When sodium levels are low, your cells absorb extra water and they swell. The swelling happens throughout the body, but it mostly affects the brain. CAUSES This condition may be caused by:  Heart, kidney, or liver problems.  Thyroid problems.  Adrenal gland problems.  Metabolic conditions, such as syndrome of inappropriate antidiuretic hormone (SIADH).  Severe vomiting and diarrhea.  Certain medicines or illegal drugs.  Dehydration.  Drinking too much water.  Eating a diet that is low in sodium.  Large burns on your body.  Sweating. RISK FACTORS This condition is more likely to develop in people who:  Have long-term (chronic) kidney disease.  Have heart failure.  Have a medical condition that causes frequent or excessive diarrhea.  Have metabolic conditions, such as Addison disease or SIADH.  Take certain medicines that affect the sodium and fluid balance in the blood. Some of these medicine types include:  Diuretics.  NSAIDs.  Some opioid pain medicines.  Some antidepressants.  Some seizure prevention medicines. SYMPTOMS  Symptoms of this condition include:  Nausea and vomiting.  Confusion.  Lethargy.  Agitation.  Headache.  Seizures.  Unconsciousness.  Appetite loss.  Muscle weakness and cramping.  Feeling weak or light-headed.  Having a rapid heart rate.  Fainting, in severe cases. DIAGNOSIS This condition is diagnosed with a medical history and physical  exam. You will also have other tests, including:  Blood tests.  Urine tests. TREATMENT Treatment for this condition depends on the cause. Treatment may include:  Fluids given through an IV tube that is inserted into one of your veins.  Medicines to correct the sodium imbalance. If medicines are causing the condition, the medicines will need to be adjusted.  Limiting water or fluid intake to get the correct sodium balance. HOME CARE INSTRUCTIONS  Take medicines only as directed by your health care provider. Many medicines can make this condition worse. Talk with your health care provider about any medicines that you are currently taking.  Carefully follow a recommended diet as directed by your health care provider.  Carefully follow instructions from your health care provider about fluid restrictions.  Keep all follow-up visits as directed by your health care provider. This is important.  Do not drink alcohol. SEEK MEDICAL CARE IF:  You develop worsening nausea, fatigue, headache, confusion, or weakness.  Your symptoms go away and then return.  You have problems following the recommended diet. SEEK IMMEDIATE MEDICAL CARE IF:  You have a seizure.  You faint.  You have ongoing diarrhea or vomiting.   This information is not intended to replace advice given to you by your health care provider. Make sure you discuss any questions you have with your health care provider.   Document Released: 02/25/2002 Document Revised: 07/22/2014 Document Reviewed: 03/27/2014 Elsevier Interactive Patient Education [employer]2016 Elsevier Inc.

## 2015-08-31 NOTE — ED Notes (Signed)
Pt ambulated in hallway, per MD order, without difficulty or distress. Pt denied any pain, weakness, dizziness, SOB.

## 2015-09-02 DIAGNOSIS — I10 Essential (primary) hypertension: Secondary | ICD-10-CM | POA: Diagnosis not present

## 2015-09-02 DIAGNOSIS — F419 Anxiety disorder, unspecified: Secondary | ICD-10-CM | POA: Diagnosis not present

## 2015-09-02 DIAGNOSIS — E032 Hypothyroidism due to medicaments and other exogenous substances: Secondary | ICD-10-CM | POA: Diagnosis not present

## 2015-09-02 DIAGNOSIS — E118 Type 2 diabetes mellitus with unspecified complications: Secondary | ICD-10-CM | POA: Diagnosis not present

## 2015-10-19 DIAGNOSIS — E032 Hypothyroidism due to medicaments and other exogenous substances: Secondary | ICD-10-CM | POA: Diagnosis not present

## 2015-10-19 DIAGNOSIS — I10 Essential (primary) hypertension: Secondary | ICD-10-CM | POA: Diagnosis not present

## 2015-10-19 DIAGNOSIS — R05 Cough: Secondary | ICD-10-CM | POA: Diagnosis not present

## 2015-10-19 DIAGNOSIS — F17200 Nicotine dependence, unspecified, uncomplicated: Secondary | ICD-10-CM | POA: Diagnosis not present

## 2015-10-19 DIAGNOSIS — R42 Dizziness and giddiness: Secondary | ICD-10-CM | POA: Diagnosis not present

## 2015-10-21 DIAGNOSIS — I10 Essential (primary) hypertension: Secondary | ICD-10-CM | POA: Diagnosis not present

## 2015-10-21 DIAGNOSIS — E118 Type 2 diabetes mellitus with unspecified complications: Secondary | ICD-10-CM | POA: Diagnosis not present

## 2015-10-21 DIAGNOSIS — L309 Dermatitis, unspecified: Secondary | ICD-10-CM | POA: Diagnosis not present

## 2015-11-11 DIAGNOSIS — E118 Type 2 diabetes mellitus with unspecified complications: Secondary | ICD-10-CM | POA: Diagnosis not present

## 2015-11-11 DIAGNOSIS — E032 Hypothyroidism due to medicaments and other exogenous substances: Secondary | ICD-10-CM | POA: Diagnosis not present

## 2015-11-11 DIAGNOSIS — E789 Disorder of lipoprotein metabolism, unspecified: Secondary | ICD-10-CM | POA: Diagnosis not present

## 2015-11-18 DIAGNOSIS — I10 Essential (primary) hypertension: Secondary | ICD-10-CM | POA: Diagnosis not present

## 2015-11-18 DIAGNOSIS — E118 Type 2 diabetes mellitus with unspecified complications: Secondary | ICD-10-CM | POA: Diagnosis not present

## 2015-11-18 DIAGNOSIS — E789 Disorder of lipoprotein metabolism, unspecified: Secondary | ICD-10-CM | POA: Diagnosis not present

## 2015-11-18 DIAGNOSIS — L309 Dermatitis, unspecified: Secondary | ICD-10-CM | POA: Diagnosis not present

## 2016-01-12 DIAGNOSIS — E118 Type 2 diabetes mellitus with unspecified complications: Secondary | ICD-10-CM | POA: Diagnosis not present

## 2016-01-12 DIAGNOSIS — I10 Essential (primary) hypertension: Secondary | ICD-10-CM | POA: Diagnosis not present

## 2016-01-12 DIAGNOSIS — E789 Disorder of lipoprotein metabolism, unspecified: Secondary | ICD-10-CM | POA: Diagnosis not present

## 2016-01-12 DIAGNOSIS — E032 Hypothyroidism due to medicaments and other exogenous substances: Secondary | ICD-10-CM | POA: Diagnosis not present

## 2016-01-20 DIAGNOSIS — I10 Essential (primary) hypertension: Secondary | ICD-10-CM | POA: Diagnosis not present

## 2016-01-20 DIAGNOSIS — Z23 Encounter for immunization: Secondary | ICD-10-CM | POA: Diagnosis not present

## 2016-01-20 DIAGNOSIS — E118 Type 2 diabetes mellitus with unspecified complications: Secondary | ICD-10-CM | POA: Diagnosis not present

## 2016-01-20 DIAGNOSIS — E789 Disorder of lipoprotein metabolism, unspecified: Secondary | ICD-10-CM | POA: Diagnosis not present

## 2016-03-29 DIAGNOSIS — E118 Type 2 diabetes mellitus with unspecified complications: Secondary | ICD-10-CM | POA: Diagnosis not present

## 2016-03-29 DIAGNOSIS — I10 Essential (primary) hypertension: Secondary | ICD-10-CM | POA: Diagnosis not present

## 2016-04-05 DIAGNOSIS — R55 Syncope and collapse: Secondary | ICD-10-CM | POA: Diagnosis not present

## 2016-04-05 DIAGNOSIS — I10 Essential (primary) hypertension: Secondary | ICD-10-CM | POA: Diagnosis not present

## 2016-04-05 DIAGNOSIS — E118 Type 2 diabetes mellitus with unspecified complications: Secondary | ICD-10-CM | POA: Diagnosis not present

## 2016-04-11 ENCOUNTER — Other Ambulatory Visit: Payer: Self-pay | Admitting: Endocrinology

## 2016-04-11 DIAGNOSIS — G4452 New daily persistent headache (NDPH): Secondary | ICD-10-CM

## 2016-04-12 ENCOUNTER — Other Ambulatory Visit: Payer: Medicare Other

## 2016-04-14 ENCOUNTER — Ambulatory Visit
Admission: RE | Admit: 2016-04-14 | Discharge: 2016-04-14 | Disposition: A | Discharge status: Home / Self Care | Payer: Medicare Other | Source: Ambulatory Visit | Attending: Endocrinology | Admitting: Endocrinology

## 2016-04-14 DIAGNOSIS — G4452 New daily persistent headache (NDPH): Secondary | ICD-10-CM

## 2016-04-14 DIAGNOSIS — R42 Dizziness and giddiness: Secondary | ICD-10-CM | POA: Diagnosis not present

## 2016-04-14 MED ORDER — IOPAMIDOL (ISOVUE-300) INJECTION 61%
75.0000 mL | Freq: Once | INTRAVENOUS | Status: AC | PRN
  Administered 2016-04-14: 75 mL via INTRAVENOUS

## 2016-06-02 DIAGNOSIS — E118 Type 2 diabetes mellitus with unspecified complications: Secondary | ICD-10-CM | POA: Diagnosis not present

## 2016-06-02 DIAGNOSIS — Z0000 Encounter for general adult medical examination without abnormal findings: Secondary | ICD-10-CM | POA: Diagnosis not present

## 2016-06-02 DIAGNOSIS — I10 Essential (primary) hypertension: Secondary | ICD-10-CM | POA: Diagnosis not present

## 2016-06-02 DIAGNOSIS — E789 Disorder of lipoprotein metabolism, unspecified: Secondary | ICD-10-CM | POA: Diagnosis not present

## 2016-06-09 DIAGNOSIS — F17200 Nicotine dependence, unspecified, uncomplicated: Secondary | ICD-10-CM | POA: Diagnosis not present

## 2016-06-09 DIAGNOSIS — I10 Essential (primary) hypertension: Secondary | ICD-10-CM | POA: Diagnosis not present

## 2016-06-09 DIAGNOSIS — F17210 Nicotine dependence, cigarettes, uncomplicated: Secondary | ICD-10-CM | POA: Diagnosis not present

## 2016-06-09 DIAGNOSIS — E118 Type 2 diabetes mellitus with unspecified complications: Secondary | ICD-10-CM | POA: Diagnosis not present

## 2016-06-09 DIAGNOSIS — E032 Hypothyroidism due to medicaments and other exogenous substances: Secondary | ICD-10-CM | POA: Diagnosis not present

## 2016-06-22 DIAGNOSIS — Z1211 Encounter for screening for malignant neoplasm of colon: Secondary | ICD-10-CM | POA: Diagnosis not present

## 2016-06-22 DIAGNOSIS — Z1212 Encounter for screening for malignant neoplasm of rectum: Secondary | ICD-10-CM | POA: Diagnosis not present

## 2016-06-29 DIAGNOSIS — E119 Type 2 diabetes mellitus without complications: Secondary | ICD-10-CM | POA: Diagnosis not present

## 2016-08-02 DIAGNOSIS — E789 Disorder of lipoprotein metabolism, unspecified: Secondary | ICD-10-CM | POA: Diagnosis not present

## 2016-08-09 DIAGNOSIS — I10 Essential (primary) hypertension: Secondary | ICD-10-CM | POA: Diagnosis not present

## 2016-08-09 DIAGNOSIS — E118 Type 2 diabetes mellitus with unspecified complications: Secondary | ICD-10-CM | POA: Diagnosis not present

## 2016-08-09 DIAGNOSIS — E039 Hypothyroidism, unspecified: Secondary | ICD-10-CM | POA: Diagnosis not present

## 2017-01-23 DIAGNOSIS — E039 Hypothyroidism, unspecified: Secondary | ICD-10-CM | POA: Diagnosis not present

## 2017-01-23 DIAGNOSIS — I10 Essential (primary) hypertension: Secondary | ICD-10-CM | POA: Diagnosis not present

## 2017-01-23 DIAGNOSIS — E7800 Pure hypercholesterolemia, unspecified: Secondary | ICD-10-CM | POA: Diagnosis not present

## 2017-01-23 DIAGNOSIS — E119 Type 2 diabetes mellitus without complications: Secondary | ICD-10-CM | POA: Diagnosis not present

## 2017-06-14 DIAGNOSIS — E7800 Pure hypercholesterolemia, unspecified: Secondary | ICD-10-CM | POA: Diagnosis not present

## 2017-06-14 DIAGNOSIS — E118 Type 2 diabetes mellitus with unspecified complications: Secondary | ICD-10-CM | POA: Diagnosis not present

## 2017-06-19 DIAGNOSIS — I10 Essential (primary) hypertension: Secondary | ICD-10-CM | POA: Diagnosis not present

## 2017-06-19 DIAGNOSIS — E039 Hypothyroidism, unspecified: Secondary | ICD-10-CM | POA: Diagnosis not present

## 2017-06-19 DIAGNOSIS — Z720 Tobacco use: Secondary | ICD-10-CM | POA: Diagnosis not present

## 2017-06-19 DIAGNOSIS — Z0001 Encounter for general adult medical examination with abnormal findings: Secondary | ICD-10-CM | POA: Diagnosis not present

## 2017-06-21 ENCOUNTER — Other Ambulatory Visit: Payer: Self-pay | Admitting: Internal Medicine

## 2017-06-21 DIAGNOSIS — R911 Solitary pulmonary nodule: Secondary | ICD-10-CM

## 2017-06-30 DIAGNOSIS — H2513 Age-related nuclear cataract, bilateral: Secondary | ICD-10-CM | POA: Diagnosis not present

## 2017-06-30 DIAGNOSIS — E119 Type 2 diabetes mellitus without complications: Secondary | ICD-10-CM | POA: Diagnosis not present

## 2017-07-03 ENCOUNTER — Ambulatory Visit
Admission: RE | Admit: 2017-07-03 | Discharge: 2017-07-03 | Disposition: A | Discharge status: Home / Self Care | Payer: Medicare Other | Source: Ambulatory Visit | Attending: Internal Medicine | Admitting: Internal Medicine

## 2017-07-03 DIAGNOSIS — R911 Solitary pulmonary nodule: Secondary | ICD-10-CM

## 2017-07-20 DIAGNOSIS — M85851 Other specified disorders of bone density and structure, right thigh: Secondary | ICD-10-CM | POA: Diagnosis not present

## 2017-07-26 ENCOUNTER — Ambulatory Visit: Payer: Medicare Other | Admitting: Cardiology

## 2017-08-01 DIAGNOSIS — I10 Essential (primary) hypertension: Secondary | ICD-10-CM | POA: Diagnosis not present

## 2017-08-01 DIAGNOSIS — E785 Hyperlipidemia, unspecified: Secondary | ICD-10-CM | POA: Diagnosis not present

## 2017-08-22 ENCOUNTER — Institutional Professional Consult (permissible substitution): Payer: Medicare Other | Admitting: Pulmonary Disease

## 2017-09-12 DIAGNOSIS — E785 Hyperlipidemia, unspecified: Secondary | ICD-10-CM | POA: Diagnosis not present

## 2017-09-12 DIAGNOSIS — E559 Vitamin D deficiency, unspecified: Secondary | ICD-10-CM | POA: Diagnosis not present

## 2017-09-14 ENCOUNTER — Institutional Professional Consult (permissible substitution): Payer: Medicare Other | Admitting: Pulmonary Disease

## 2017-09-25 ENCOUNTER — Ambulatory Visit: Payer: Medicare Other | Admitting: Pulmonary Disease

## 2017-09-25 ENCOUNTER — Encounter: Payer: Self-pay | Admitting: Pulmonary Disease

## 2017-09-25 DIAGNOSIS — F17210 Nicotine dependence, cigarettes, uncomplicated: Secondary | ICD-10-CM

## 2017-09-25 DIAGNOSIS — R918 Other nonspecific abnormal finding of lung field: Secondary | ICD-10-CM | POA: Diagnosis not present

## 2017-09-25 NOTE — Progress Notes (Signed)
Katherine Martin    [id number]    [birth year]  Primary Care Physician:Kohut, Zollie Beckers, MD  Referring Physician: Merri Brunette, MD [address] [place], [place] [address]  Chief complaint: Consult for lung nodule  HPI: 73 year old active smoker with coronary artery disease, diabetes, hyperlipidemia, hypothyroidism, hypertension for evaluation of lung nodule.  3 mm left upper lobe nodule seen initially in 2015.  She had a follow-up CT in 2019 which showed a persistent nodule measuring 6 mm.  Chief complaint is dyspnea with activity.  No symptoms at rest.  She has cough with white mucus production.  Denies any wheezing, fevers, chills, hemoptysis, loss of weight, loss of appetite.  Pets: No pets Occupation: Used to work as a [employer] operational officer, bookkeeper exposures: Smoking history: 50-pack-year smoker.  Continues to smoke 1 pack/day Travel history: Originally from [place].  No significant recent travel Relevant family history: No significant family history of lung issues.  Outpatient Encounter Medications as of 09/25/2017  Medication Sig  . ACTOS 15 MG tablet Take 1 tablet by mouth every morning.   Marland Kitchen aspirin 81 MG tablet Take 81 mg by mouth at bedtime.   Marland Kitchen atenolol-chlorthalidone (TENORETIC) 50-25 MG per tablet Take 0.5 tablets by mouth every evening.   . calcium carbonate (TUMS - DOSED IN MG ELEMENTAL CALCIUM) 500 MG chewable tablet Chew 1 tablet by mouth daily as needed for indigestion or heartburn.  . meclizine (ANTIVERT) 25 MG tablet Take 1 tablet (25 mg total) by mouth 3 (three) times daily as needed for dizziness.  . nitroGLYCERIN (NITROSTAT) 0.4 MG SL tablet Place 0.4 mg under the tongue every 5 (five) minutes x 3 doses as needed for chest pain.  Marland Kitchen PAXIL CR 25 MG 24 hr tablet Take 50 mg by mouth every evening.   Marland Kitchen SYNTHROID 125 MCG tablet Take 1 tablet by mouth daily before breakfast.   . valsartan (DIOVAN) 80 MG tablet Take 80 mg by mouth 2 (two) times daily.  .  [DISCONTINUED] pravastatin (PRAVACHOL) 10 MG tablet Take 10 mg by mouth daily.   No facility-administered encounter medications on file as of 09/25/2017.     Allergies as of 09/25/2017 - Review Complete 08/31/2015  Allergen Reaction Noted  . Clindamycin/lincomycin Anaphylaxis 10/17/2012  . Buspar [buspirone]  10/17/2012  . Crestor [rosuvastatin calcium] Other (See Comments) 08/31/2015  . Demerol [meperidine] Hives 10/17/2012  . Effexor [venlafaxine]  10/17/2012  . Erythromycin Hives 10/17/2012  . Levoxyl [levothyroxine]  10/17/2012  . Other Hives 10/17/2012  . Penicillins Hives 10/17/2012  . Prednisone Hives 10/17/2012  . Sular [nisoldipine er]  10/17/2012  . Zithromax [azithromycin]  10/17/2012  . Zoloft [sertraline hcl]  10/17/2012    Past Medical History:  Diagnosis Date  . Anxiety   . Complication of anesthesia 1990s   "had trouble waking me up"  . Coronary artery disease, occlusive    cath 2007  - RCA 100%, Severe distal Circumflex  . Daily headache    "last couple weeks" (07/09/2015)  . Depression   . Dyslipidemia   . Hypertension   . Hypothyroidism   . Obesity (BMI 30.0-34.9)   . PONV (postoperative nausea and vomiting)   . PVD (peripheral vascular disease) (HCC)    LAA Dopplers, November 2013: ABIs--0.67 right, 0.65 left bilateral common iliacs less than 50% stenosis, bilateral SFA demonstrate exclusive disease with reconstitution in the proximal segment of bronchial arteries.) He artery occluded with 2 vessel runoff on the right, 3 vessel run  off on left.  . Tobacco use   . Type 2 diabetes mellitus (HCC)     Past Surgical History:  Procedure Laterality Date  . CARDIAC CATHETERIZATION  03/06/2006   100% RCA, Severe distal Circumflex stenosis, moderate LAD; EF ~60% with mild anterior hypokinesis. (Dr. Richarda BladeJ. Tysinger)  . CHOLECYSTECTOMY OPEN    . DILATION AND CURETTAGE OF UTERUS    . Lower Extremity Arterial Doppler  01/2012   bilateral ABI demonstrate moderate  arterial insuff at rest; abd aorta with non-hemodynamically significant plaque; bilat CIA with equal/less than 50% diameter reduction; bilat SFA with occlusive disease & reconstiution in prox popliteal; R peroneal not visualized  . Lower Extremity Arterial Doppler  September 2014   RABI: 0.55; LABI 0.53; moderate CEA, bilateral SFA-occlusive with reconstitution and proximal Popliteal; bilateral Peroneal artery occlusion.  Marland Kitchen. [employer] MYOVIEW [employer]TD - Treadmill  02/2009   Walk 6 minutes. No ischemia or infarction.  . TONSILLECTOMY      Family History  Problem Relation Age of Onset  . Heart attack Father 1236  . Heart failure Paternal Grandmother   . Heart attack Paternal Grandfather   . Heart attack Brother        CABG  . Heart attack Sister     Social History   Socioeconomic History  . Marital status: Widowed    Spouse name: Not on file  . Number of children: 1  . Years of education: 4112  . Highest education level: Not on file  Occupational History    Employer: OTHER  Social Needs  . Financial resource strain: Not on file  . Food insecurity:    Worry: Not on file    Inability: Not on file  . Transportation needs:    Medical: Not on file    Non-medical: Not on file  Tobacco Use  . Smoking status: Current Every Day Smoker    Packs/day: 1.00    Years: 50.00    Pack years: 50.00    Types: Cigarettes  . Smokeless tobacco: Never Used  Substance and Sexual Activity  . Alcohol use: No  . Drug use: No  . Sexual activity: Never  Lifestyle  . Physical activity:    Days per week: Not on file    Minutes per session: Not on file  . Stress: Not on file  Relationships  . Social connections:    Talks on phone: Not on file    Gets together: Not on file    Attends religious service: Not on file    Active member of club or organization: Not on file    Attends meetings of clubs or organizations: Not on file    Relationship status: Not on file  . Intimate partner violence:    Fear of  current or ex partner: Not on file    Emotionally abused: Not on file    Physically abused: Not on file    Forced sexual activity: Not on file  Other Topics Concern  . Not on file  Social History Narrative   She is a widowed mother of one. She continues to smoke a pack a day, as she has for about 40 years. She is trying to pick up her exercise level but does not really do much with exercise. She says she is basically lazy    Review of systems: Review of Systems  Constitutional: Negative for fever and chills.  HENT: Negative.   Eyes: Negative for blurred vision.  Respiratory: as per HPI  Cardiovascular: Negative  for chest pain and palpitations.  Gastrointestinal: Negative for vomiting, diarrhea, blood per rectum. Genitourinary: Negative for dysuria, urgency, frequency and hematuria.  Musculoskeletal: Negative for myalgias, back pain and joint pain.  Skin: Negative for itching and rash.  Neurological: Negative for dizziness, tremors, focal weakness, seizures and loss of consciousness.  Endo/Heme/Allergies: Negative for environmental allergies.  Psychiatric/Behavioral: Negative for depression, suicidal ideas and hallucinations.  All other systems reviewed and are negative.  Physical Exam: Blood pressure 136/74, pulse 84, height 5\' 2"  (1.575 m), weight 176 lb (79.8 kg), SpO2 98 %. Gen:      No acute distress HEENT:  EOMI, sclera anicteric Neck:     No masses; no thyromegaly Lungs:    Clear to auscultation bilaterally; normal respiratory effort CV:         Regular rate and rhythm; no murmurs Abd:      + bowel sounds; soft, non-tender; no palpable masses, no distension Ext:    No edema; adequate peripheral perfusion Skin:      Warm and dry; no rash Neuro: alert and oriented x 3 Psych: normal mood and affect  Data Reviewed: CT chest 06/03/2013- 3 mm left upper lobe nodule.  Extensive three-vessel coronary artery calcification CT chest 07/03/2017- 6 mm left upper lobe nodule, coronary  artery calcifications I have reviewed the images personally.  Labs from primary care CBC 06/14/2017-WBC 8.9, eos 3.8%  Assessment:  Lung nodule Noted to have a subcentimeter left upper lobe nodule.  This may have grown from 2015-19 although the difference in size may be secondary to thinner slices on the recent scan.  Order 1 year follow-up scan without contrast in April 2020  to monitor. If it continues to be stable then she can transition to low-dose screening CTs of the chest.  Coronary artery disease Extensive calcification noted on CT chest.  She has already been referred to cardiology by primary care.  Active smoker Discussed smoking cessation but she wants to quit on her own.  Time spent counseling-5 minutes Reassess at next visit  She is currently asymptomatic and does not need any inhalers.  Defer pulmonary function test as per patient preference.  Health maintenance Pneumovax 06/19/2017  Plan/Recommendations: - Follow-up CT without contrast in 1 year - Smoking cessation  Chilton Greathouse MD [facility] 09/25/2017, 3:15 PM  CC: Merri Brunette, MD

## 2017-09-25 NOTE — Patient Instructions (Addendum)
We will get a CT chest without contrast and April 2020 for follow-up of lung nodules Continue to work on smoking cessation I will see her back in clinic after CT scan

## 2017-09-26 ENCOUNTER — Encounter: Payer: Self-pay | Admitting: Pulmonary Disease

## 2017-09-26 DIAGNOSIS — I10 Essential (primary) hypertension: Secondary | ICD-10-CM | POA: Diagnosis not present

## 2017-09-26 DIAGNOSIS — E785 Hyperlipidemia, unspecified: Secondary | ICD-10-CM | POA: Diagnosis not present

## 2017-09-26 DIAGNOSIS — E559 Vitamin D deficiency, unspecified: Secondary | ICD-10-CM | POA: Diagnosis not present

## 2017-09-26 DIAGNOSIS — M810 Age-related osteoporosis without current pathological fracture: Secondary | ICD-10-CM | POA: Diagnosis not present

## 2018-07-06 ENCOUNTER — Inpatient Hospital Stay: Admission: RE | Admit: 2018-07-06 | Payer: Medicare Other | Source: Ambulatory Visit

## 2018-07-19 ENCOUNTER — Ambulatory Visit: Payer: Medicare Other | Admitting: Pulmonary Disease

## 2018-09-19 DIAGNOSIS — I10 Essential (primary) hypertension: Secondary | ICD-10-CM | POA: Diagnosis not present

## 2018-09-19 DIAGNOSIS — E118 Type 2 diabetes mellitus with unspecified complications: Secondary | ICD-10-CM | POA: Diagnosis not present

## 2018-09-26 DIAGNOSIS — Z0000 Encounter for general adult medical examination without abnormal findings: Secondary | ICD-10-CM | POA: Diagnosis not present

## 2018-09-26 DIAGNOSIS — E1121 Type 2 diabetes mellitus with diabetic nephropathy: Secondary | ICD-10-CM | POA: Diagnosis not present

## 2018-09-26 DIAGNOSIS — I10 Essential (primary) hypertension: Secondary | ICD-10-CM | POA: Diagnosis not present

## 2018-09-26 DIAGNOSIS — E039 Hypothyroidism, unspecified: Secondary | ICD-10-CM | POA: Diagnosis not present

## 2018-09-28 ENCOUNTER — Inpatient Hospital Stay: Admission: RE | Admit: 2018-09-28 | Payer: Medicare Other | Source: Ambulatory Visit

## 2018-10-22 ENCOUNTER — Other Ambulatory Visit: Payer: Self-pay

## 2018-10-22 ENCOUNTER — Ambulatory Visit (INDEPENDENT_AMBULATORY_CARE_PROVIDER_SITE_OTHER)
Admission: RE | Admit: 2018-10-22 | Discharge: 2018-10-22 | Disposition: A | Discharge status: Home / Self Care | Payer: Medicare Other | Source: Ambulatory Visit | Attending: Pulmonary Disease | Admitting: Pulmonary Disease

## 2018-10-22 DIAGNOSIS — R918 Other nonspecific abnormal finding of lung field: Secondary | ICD-10-CM | POA: Diagnosis not present

## 2018-10-22 DIAGNOSIS — R911 Solitary pulmonary nodule: Secondary | ICD-10-CM | POA: Diagnosis not present

## 2018-11-20 ENCOUNTER — Telehealth: Payer: Self-pay | Admitting: Pulmonary Disease

## 2018-11-20 NOTE — Telephone Encounter (Signed)
Call returned to patient, made aware of results per PM:   Notes recorded by Marshell Garfinkel, MD on 11/19/2018 at 4:46 PM EDT  CT chest shows lung nodule is stable. Will discuss further at time of clinic visit.  Voiced understanding. Nothing further needed at this time.

## 2018-12-03 ENCOUNTER — Other Ambulatory Visit: Payer: Self-pay

## 2018-12-03 ENCOUNTER — Encounter: Payer: Self-pay | Admitting: Pulmonary Disease

## 2018-12-03 ENCOUNTER — Ambulatory Visit: Payer: Medicare Other | Admitting: Pulmonary Disease

## 2018-12-03 VITALS — BP 122/80 | HR 71 | Temp 97.3°F | Ht 62.0 in | Wt 172.6 lb

## 2018-12-03 DIAGNOSIS — Z720 Tobacco use: Secondary | ICD-10-CM

## 2018-12-03 NOTE — Patient Instructions (Signed)
I am glad you are doing well with regard to your breathing CT scan shows that the lung nodule is stable Continue to work on smoking cessation Follow-up in 1 year

## 2018-12-03 NOTE — Progress Notes (Signed)
Katherine Martin    [id number]    [birth year]  Primary Care Physician:Pharr, Zollie BeckersWalter, MD  Referring Physician: Merri BrunettePharr, Walter, MD [address] [place] [id number]  Chief complaint: Follow-up for lung nodule  HPI: 73 year old active smoker with coronary artery disease, diabetes, hyperlipidemia, hypothyroidism, hypertension for evaluation of lung nodule.  3 mm left upper lobe nodule seen initially in 2015.  She had a follow-up CT in 2019 which showed a persistent nodule measuring 6 mm.  Chief complaint is dyspnea with activity.  No symptoms at rest.  She has cough with white mucus production.  Denies any wheezing, fevers, chills, hemoptysis, loss of weight, loss of appetite.  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  Continues to smoke 1 pack/day Travel history: Originally from [place].  No significant recent travel Relevant family history: No significant family history of lung issues.  Interim history: Here for review of CT scan Breathing is doing well with no issues.  Denies any cough, sputum production, dyspnea Still smoking 1 pack/day.  Outpatient Encounter Medications as of 12/03/2018  Medication Sig  . ACTOS 15 MG tablet Take 1 tablet by mouth every morning.   Marland Kitchen. aspirin 81 MG tablet Take 81 mg by mouth at bedtime.   . calcium carbonate (TUMS - DOSED IN MG ELEMENTAL CALCIUM) 500 MG chewable tablet Chew 1 tablet by mouth daily as needed for indigestion or heartburn.  . nitroGLYCERIN (NITROSTAT) 0.4 MG SL tablet Place 0.4 mg under the tongue every 5 (five) minutes x 3 doses as needed for chest pain.  Marland Kitchen. PAXIL CR 25 MG 24 hr tablet Take 50 mg by mouth every evening.   Marland Kitchen. SYNTHROID 125 MCG tablet Take 1 tablet by mouth daily before breakfast.   . valsartan (DIOVAN) 80 MG tablet Take 80 mg by mouth 2 (two) times daily.  Marland Kitchen. atenolol-chlorthalidone (TENORETIC) 50-25 MG per tablet Take  0.5 tablets by mouth every evening.   . meclizine (ANTIVERT) 25 MG tablet Take 1 tablet (25 mg total) by mouth 3 (three) times daily as needed for dizziness. (Patient not taking: Reported on 12/03/2018)   No facility-administered encounter medications on file as of 12/03/2018.    Physical Exam: Blood pressure 122/80, pulse 71, temperature (!) 97.3 F (36.3 C), temperature source Temporal, height 5\' 2"  (1.575 m), weight 172 lb 9.6 oz (78.3 kg), SpO2 97 %. Gen:      No acute distress HEENT:  EOMI, sclera anicteric Neck:     No masses; no thyromegaly Lungs:    Clear to auscultation bilaterally; normal respiratory effort CV:         Regular rate and rhythm; no murmurs Abd:      + bowel sounds; soft, non-tender; no palpable masses, no distension Ext:    No edema; adequate peripheral perfusion Skin:      Warm and dry; no rash Neuro: alert and oriented x 3 Psych: normal mood and affect  Data Reviewed: CT chest 06/03/2013- 3 mm left upper lobe nodule.  Extensive three-vessel coronary artery calcification CT chest 07/03/2017- 6 mm left upper lobe nodule, coronary artery calcifications CT chest 10/22/2018- stable 6 mm pulmonary nodule. I have reviewed the images personally.  Labs from primary care CBC 06/14/2017-WBC 8.9, eos 3.8%  Assessment:  Lung nodule Noted to have a subcentimeter left upper lobe nodule.  This may have grown from 2015-19 although the difference in size may  be secondary to thinner slices on the recent scan.  The most recent CT reviewed with stability in the size We will transition to annual low-dose screening CTs of the chest   Coronary artery disease Extensive calcification noted on CT chest.  She has already been referred to cardiology by primary care.  Active smoker Discussed smoking cessation but she is not ready to quit yet Reassess at next visit  She is currently asymptomatic and does not need any inhalers.  Defer pulmonary function test as per patient preference.   Health maintenance Pneumovax 06/19/2017 Patient does not want flu vaccine  Plan/Recommendations: - Annual low-dose screening CTs of the chest. - Smoking cessation  Marshell Garfinkel MD [facility] 12/03/2018, 1:42 PM  CC: Deland Pretty, MD

## 2019-02-19 ENCOUNTER — Other Ambulatory Visit: Payer: Self-pay | Admitting: Internal Medicine

## 2019-02-19 DIAGNOSIS — Z1231 Encounter for screening mammogram for malignant neoplasm of breast: Secondary | ICD-10-CM

## 2019-02-20 DIAGNOSIS — I10 Essential (primary) hypertension: Secondary | ICD-10-CM | POA: Diagnosis not present

## 2019-05-07 DIAGNOSIS — F4310 Post-traumatic stress disorder, unspecified: Secondary | ICD-10-CM | POA: Diagnosis not present

## 2019-05-07 DIAGNOSIS — F331 Major depressive disorder, recurrent, moderate: Secondary | ICD-10-CM | POA: Diagnosis not present

## 2019-06-02 ENCOUNTER — Encounter (HOSPITAL_COMMUNITY): Payer: Self-pay

## 2019-06-02 ENCOUNTER — Emergency Department (HOSPITAL_COMMUNITY)
Admission: EM | Admit: 2019-06-02 | Discharge: 2019-06-03 | Disposition: A | Discharge status: Home / Self Care | Payer: Medicare Other | Attending: Emergency Medicine | Admitting: Emergency Medicine

## 2019-06-02 ENCOUNTER — Other Ambulatory Visit: Payer: Self-pay

## 2019-06-02 DIAGNOSIS — R002 Palpitations: Secondary | ICD-10-CM | POA: Diagnosis not present

## 2019-06-02 DIAGNOSIS — R5383 Other fatigue: Secondary | ICD-10-CM | POA: Diagnosis not present

## 2019-06-02 DIAGNOSIS — E119 Type 2 diabetes mellitus without complications: Secondary | ICD-10-CM | POA: Diagnosis not present

## 2019-06-02 DIAGNOSIS — I2510 Atherosclerotic heart disease of native coronary artery without angina pectoris: Secondary | ICD-10-CM | POA: Diagnosis not present

## 2019-06-02 DIAGNOSIS — R630 Anorexia: Secondary | ICD-10-CM | POA: Insufficient documentation

## 2019-06-02 DIAGNOSIS — E039 Hypothyroidism, unspecified: Secondary | ICD-10-CM | POA: Diagnosis not present

## 2019-06-02 DIAGNOSIS — R531 Weakness: Secondary | ICD-10-CM | POA: Diagnosis not present

## 2019-06-02 DIAGNOSIS — I10 Essential (primary) hypertension: Secondary | ICD-10-CM | POA: Insufficient documentation

## 2019-06-02 DIAGNOSIS — Z7982 Long term (current) use of aspirin: Secondary | ICD-10-CM | POA: Diagnosis not present

## 2019-06-02 DIAGNOSIS — E860 Dehydration: Secondary | ICD-10-CM | POA: Diagnosis not present

## 2019-06-02 DIAGNOSIS — Z79899 Other long term (current) drug therapy: Secondary | ICD-10-CM | POA: Insufficient documentation

## 2019-06-02 DIAGNOSIS — F17210 Nicotine dependence, cigarettes, uncomplicated: Secondary | ICD-10-CM | POA: Diagnosis not present

## 2019-06-02 DIAGNOSIS — R918 Other nonspecific abnormal finding of lung field: Secondary | ICD-10-CM | POA: Diagnosis not present

## 2019-06-02 LAB — BASIC METABOLIC PANEL
Anion gap: 12 (ref 5–15)
BUN: 14 mg/dL (ref 8–23)
CO2: 24 mmol/L (ref 22–32)
Calcium: 9.8 mg/dL (ref 8.9–10.3)
Chloride: 103 mmol/L (ref 98–111)
Creatinine, Ser: 0.95 mg/dL (ref 0.44–1.00)
GFR calc Af Amer: >60 mL/min (ref >60)
GFR calc non Af Amer: >60 mL/min (ref >60)
Glucose, Bld: 141 mg/dL — ABNORMAL HIGH (ref 70–99)
Potassium: 4.4 mmol/L (ref 3.5–5.1)
Sodium: 139 mmol/L (ref 135–145)

## 2019-06-02 LAB — CBC
HCT: 43.3 % (ref 36.0–46.0)
Hemoglobin: 14.6 g/dL (ref 12.0–15.0)
MCH: 32.7 pg (ref 26.0–34.0)
MCHC: 33.7 g/dL (ref 30.0–36.0)
MCV: 96.9 fL (ref 80.0–100.0)
Platelets: 291 10*3/uL (ref 150–400)
RBC: 4.47 MIL/uL (ref 3.87–5.11)
RDW: 13.1 % (ref 11.5–15.5)
WBC: 12.2 10*3/uL — ABNORMAL HIGH (ref 4.0–10.5)
nRBC: 0 % (ref 0.0–0.2)

## 2019-06-02 MED ORDER — SODIUM CHLORIDE 0.9% FLUSH
3.0000 mL | Freq: Once | INTRAVENOUS | Status: AC
  Administered 2019-06-03: 3 mL via INTRAVENOUS

## 2019-06-02 NOTE — ED Triage Notes (Signed)
Pt bib [facility] from home w/ c/o generalized weakness x 5 days. EMS reports that pat has had hx of these symptoms w/ diagnosis of hyponatremia and hypokalemia. Pt hypertensive w/ BP 180/100 w/ EMS, all other VSS. EMS 12 lead unremarkable.

## 2019-06-03 ENCOUNTER — Emergency Department (HOSPITAL_COMMUNITY): Payer: Medicare Other

## 2019-06-03 DIAGNOSIS — R918 Other nonspecific abnormal finding of lung field: Secondary | ICD-10-CM | POA: Diagnosis not present

## 2019-06-03 LAB — URINALYSIS, ROUTINE W REFLEX MICROSCOPIC
Bilirubin Urine: NEGATIVE
Glucose, UA: NEGATIVE mg/dL
Ketones, ur: 15 mg/dL — AB
Leukocytes,Ua: NEGATIVE
Nitrite: NEGATIVE
Protein, ur: NEGATIVE mg/dL
Specific Gravity, Urine: <1.005 — ABNORMAL LOW (ref 1.005–1.030)
pH: 6 (ref 5.0–8.0)

## 2019-06-03 LAB — MAGNESIUM: Magnesium: 2.1 mg/dL (ref 1.7–2.4)

## 2019-06-03 LAB — URINALYSIS, MICROSCOPIC (REFLEX): WBC, UA: NONE SEEN WBC/hpf (ref 0–5)

## 2019-06-03 LAB — TSH: TSH: 1.062 u[IU]/mL (ref 0.350–4.500)

## 2019-06-03 LAB — TROPONIN I (HIGH SENSITIVITY): Troponin I (High Sensitivity): 14 ng/L (ref <18)

## 2019-06-03 LAB — T4, FREE: Free T4: 1.07 ng/dL (ref 0.61–1.12)

## 2019-06-03 MED ORDER — SODIUM CHLORIDE 0.9 % IV BOLUS (SEPSIS)
500.0000 mL | Freq: Once | INTRAVENOUS | Status: AC
  Administered 2019-06-03: 500 mL via INTRAVENOUS

## 2019-06-03 NOTE — ED Provider Notes (Signed)
TIME SEEN: 12:19 AM  CHIEF COMPLAINT: Fatigue, weakness, palpitations  HPI: Patient is a 74 year old female with history of hypertension, dyslipidemia, CAD, hypothyroidism, diabetes who presents to the emergency department with a week of lightheadedness, fatigue, decreased appetite, palpitations.  No chest pain or shortness of breath.  No fevers, cough, vomiting or diarrhea.  No numbness or focal weakness.  States she had similar symptoms previously when she was hypokalemic.  No sick contacts.  Reports poor oral intake as she states she just does not want to eat or drink.  ROS: See HPI Constitutional: no fever  Eyes: no drainage  ENT: no runny nose   Cardiovascular:  no chest pain  Resp: no SOB  GI: no vomiting GU: no dysuria Integumentary: no rash  Allergy: no hives  Musculoskeletal: no leg swelling  Neurological: no slurred speech ROS otherwise negative  PAST MEDICAL HISTORY/PAST SURGICAL HISTORY:  Past Medical History:  Diagnosis Date  . Anxiety   . Complication of anesthesia 1990s   "had trouble waking me up"  . Coronary artery disease, occlusive    cath 2007  - RCA 100%, Severe distal Circumflex  . Daily headache    "last couple weeks" (07/09/2015)  . Depression   . Dyslipidemia   . Hypertension   . Hypothyroidism   . Obesity (BMI 30.0-34.9)   . PONV (postoperative nausea and vomiting)   . PVD (peripheral vascular disease) ([facility])    LAA Dopplers, November 2013: ABIs--0.67 right, 0.65 left bilateral common iliacs less than 50% stenosis, bilateral SFA demonstrate exclusive disease with reconstitution in the proximal segment of bronchial arteries.) He artery occluded with 2 vessel runoff on the right, 3 vessel run off on left.  . Tobacco use   . Type 2 diabetes mellitus ([facility])     MEDICATIONS:  Prior to Admission medications   Medication Sig Start Date End Date Taking? Authorizing Provider  ACTOS 15 MG tablet Take 1 tablet by mouth every morning.  09/10/12   [provider]  aspirin 81 MG tablet Take 81 mg by mouth at bedtime.     [provider]  atenolol-chlorthalidone (TENORETIC) 50-25 MG per tablet Take 0.5 tablets by mouth every evening.  10/13/12   [provider]  calcium carbonate (TUMS - DOSED IN MG ELEMENTAL CALCIUM) 500 MG chewable tablet Chew 1 tablet by mouth daily as needed for indigestion or heartburn.    [provider]  meclizine (ANTIVERT) 25 MG tablet Take 1 tablet (25 mg total) by mouth 3 (three) times daily as needed for dizziness. Patient not taking: Reported on 12/03/2018 07/10/15   Caren Griffins, MD  nitroGLYCERIN (NITROSTAT) 0.4 MG SL tablet Place 0.4 mg under the tongue every 5 (five) minutes x 3 doses as needed for chest pain.    [provider]  PAXIL CR 25 MG 24 hr tablet Take 50 mg by mouth every evening.  10/02/12   [provider]  SYNTHROID 125 MCG tablet Take 1 tablet by mouth daily before breakfast.  10/10/12   [provider]  valsartan (DIOVAN) 80 MG tablet Take 80 mg by mouth 2 (two) times daily.    [provider]    ALLERGIES:  Allergies  Allergen Reactions  . Clindamycin/Lincomycin Anaphylaxis  . Buspar [Buspirone]   . Crestor [Rosuvastatin Calcium] Other (See Comments)    Extreme weakness and vomiting   . Demerol [Meperidine] Hives  . Effexor [Venlafaxine]   . Erythromycin Hives  . Levoxyl [Levothyroxine]   .  Other Hives    peaches  . Penicillins Hives    Did it involve swelling of the face/tongue/throat, SOB, or low BP? No Did it involve sudden or severe rash/hives, skin peeling, or any reaction on the inside of your mouth or nose? Yes Did you need to seek medical attention at a hospital or doctor's office? Yes When did it last happen? If all above answers are "NO", may proceed with cephalosporin use.   . Prednisone Hives  . Sular [Nisoldipine Er]   . Zithromax [Azithromycin]   . Zoloft [Sertraline Hcl]     SOCIAL HISTORY:   Social History   Tobacco Use  . Smoking status: Current Every Day Smoker    Packs/day: 1.00    Years: 50.00    Pack years: 50.00    Types: Cigarettes  . Smokeless tobacco: Never Used  Substance Use Topics  . Alcohol use: No    FAMILY HISTORY: Family History  Problem Relation Age of Onset  . Heart attack Father 68  . Heart failure Paternal Grandmother   . Heart attack Paternal Grandfather   . Heart attack Brother        CABG  . Heart attack Sister     EXAM: BP (!) 165/58 (BP Location: Right Arm)   Pulse 74   Temp 98.1 F (36.7 C) (Oral)   Resp 20   SpO2 99%  CONSTITUTIONAL: Alert and oriented and responds appropriately to questions.  Elderly, afebrile, nontoxic, in no distress HEAD: Normocephalic EYES: Conjunctivae clear, pupils appear equal, EOM appear intact ENT: normal nose; moist mucous membranes NECK: Supple, normal ROM CARD: RRR; S1 and S2 appreciated; no murmurs, no clicks, no rubs, no gallops RESP: Normal chest excursion without splinting or tachypnea; breath sounds clear and equal bilaterally; no wheezes, no rhonchi, no rales, no hypoxia or respiratory distress, speaking full sentences ABD/GI: Normal bowel sounds; non-distended; soft, non-tender, no rebound, no guarding, no peritoneal signs, no hepatosplenomegaly BACK:  The back appears normal EXT: Normal ROM in all joints; no deformity noted, no edema; no cyanosis SKIN: Normal color for age and race; warm; no rash on exposed skin NEURO: Moves all extremities equally, no drift, sensation to light touch intact diffusely, cranial nerves II through XII intact, normal speech PSYCH: The patient's mood and manner are appropriate.   MEDICAL DECISION MAKING: Patient here with complaints of fatigue, lightheadedness, palpitations and anorexia.  No infectious symptoms.  No chest pain or shortness of breath.  EKG shows no ischemic change, arrhythmia or interval abnormality.  She is concerned that she could have an  electrolyte derangement today.  Potassium is currently normal at 4.4.  Will add on magnesium level and also check her TSH.  Will obtain 1 troponin given her palpitations and history of CAD but she denies having chest pain, tightness, pressure.  Urinalysis pending.  Will hydrate patient and check orthostatic vital signs.  Neurologically intact here.  Doubt stroke.  ED PROGRESS: Thyroid labs, troponin, magnesium normal.  Chest x-ray shows atelectasis but no other acute abnormality.  Urine shows no sign of infection and small ketones.  She has received IV fluids.  She is not orthostatic here.  Recommended close follow-up with her PCP.  I suspect many of her symptoms are secondary to decreased oral intake.  Discussed with patient that even though she feels like she does not want to eat or drink that she should continue to push herself to do so otherwise she is at risk for dehydration, renal failure, electrolyte  abnormalities.  She verbalized understanding.   At this time, I do not feel there is any life-threatening condition present. I have reviewed, interpreted and discussed all results (EKG, imaging, lab, urine as appropriate) and exam findings with patient/family. I have reviewed nursing notes and appropriate previous records.  I feel the patient is safe to be discharged home without further emergent workup and can continue workup as an outpatient as needed. Discussed usual and customary return precautions. Patient/family verbalize understanding and are comfortable with this plan.  Outpatient follow-up has been provided as needed. All questions have been answered.     EKG Interpretation  Date/Time:  Sunday June 02 2019 21:14:01 EDT Ventricular Rate:  84 PR Interval:  136 QRS Duration: 82 QT Interval:  358 QTC Calculation: 423 R Axis:   45 Text Interpretation: Normal sinus rhythm Normal ECG No significant change since last tracing Confirmed by Rochele Raring [phone]) on 06/02/2019 11:24:46 PM          Domingo Mend was evaluated in Emergency Department on 06/03/2019 for the symptoms described in the history of present illness. She was evaluated in the context of the global COVID-19 pandemic, which necessitated consideration that the patient might be at risk for infection with the SARS-CoV-2 virus that causes COVID-19. Institutional protocols and algorithms that pertain to the evaluation of patients at risk for COVID-19 are in a state of rapid change based on information released by regulatory bodies including the CDC and federal and state organizations. These policies and algorithms were followed during the patient's care in the ED.  Patient was seen wearing N95, face shield, gloves.    Tranell Wojtkiewicz, Layla Maw, DO 06/03/19 [phone]

## 2019-06-03 NOTE — ED Notes (Signed)
Per RN she will draw labs when she starts the IV

## 2019-07-22 DIAGNOSIS — K5900 Constipation, unspecified: Secondary | ICD-10-CM | POA: Diagnosis not present

## 2019-07-22 DIAGNOSIS — M810 Age-related osteoporosis without current pathological fracture: Secondary | ICD-10-CM | POA: Diagnosis not present

## 2019-07-22 DIAGNOSIS — R5382 Chronic fatigue, unspecified: Secondary | ICD-10-CM | POA: Diagnosis not present

## 2019-07-22 DIAGNOSIS — F411 Generalized anxiety disorder: Secondary | ICD-10-CM | POA: Diagnosis not present

## 2019-08-12 DIAGNOSIS — E875 Hyperkalemia: Secondary | ICD-10-CM | POA: Diagnosis not present

## 2019-08-26 DIAGNOSIS — K5900 Constipation, unspecified: Secondary | ICD-10-CM | POA: Diagnosis not present

## 2019-08-26 DIAGNOSIS — R5383 Other fatigue: Secondary | ICD-10-CM | POA: Diagnosis not present

## 2019-09-25 IMAGING — CT CT CHEST W/O CM
1 of 2 series · 14 of 29 positions shown, 18 images · non-contrast
Comparison: CT of the chest from 06/03/2013, chest x-ray 06/09/2016

CLINICAL DATA: Follow-up lung nodule

EXAM:
CT CHEST WITHOUT CONTRAST
TECHNIQUE: Multidetector CT imaging of the chest was performed following the
standard protocol without IV contrast.

[Series 2: chest w/(date) · axial · 0.71mm/px · z∈[-335,-65]mm · 14 of 159 slices shown, 18 images]
[im 12/159  mediastinal]
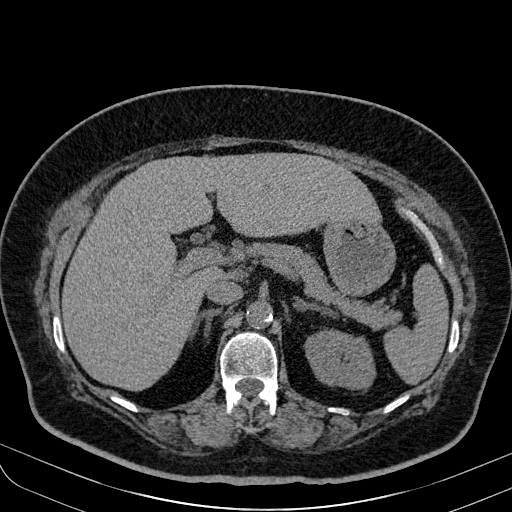
[im 12/159  lung]
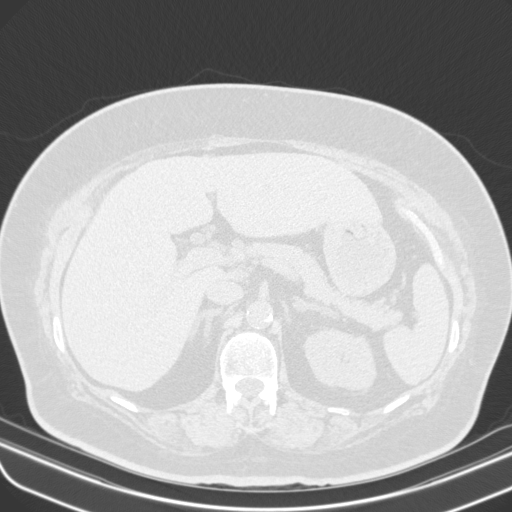
[im 23/159  lung]
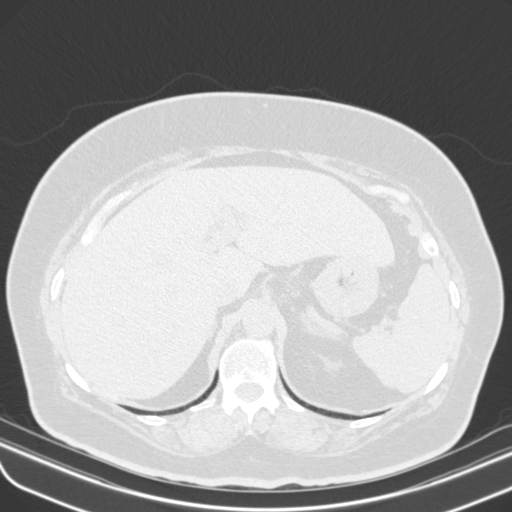
[im 34/159  lung]
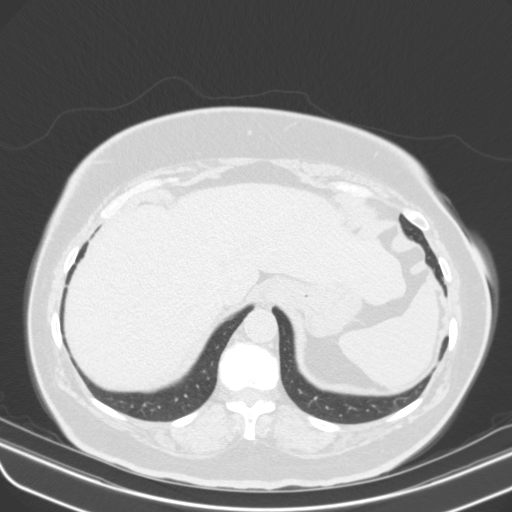
[im 46/159  lung]
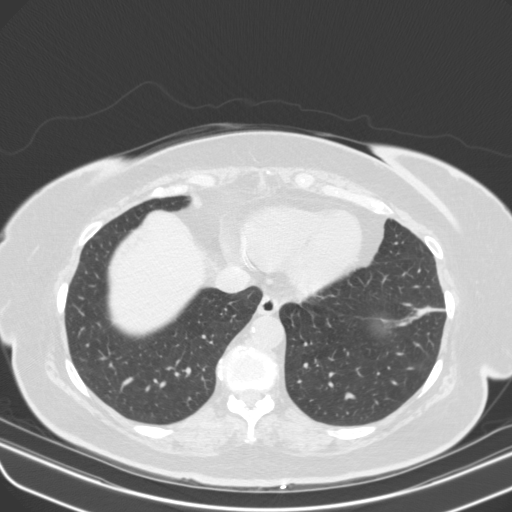
[im 57/159  mediastinal]
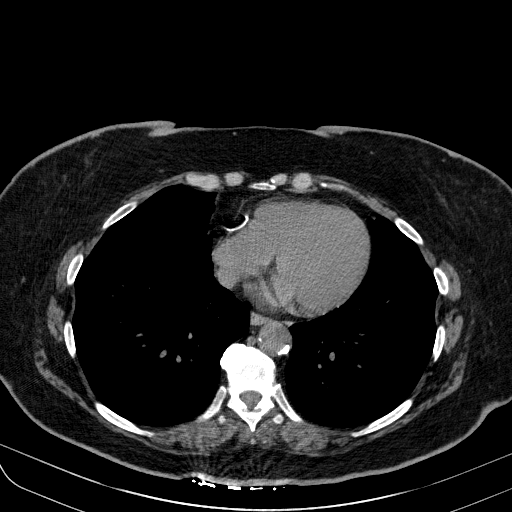
[im 57/159  lung]
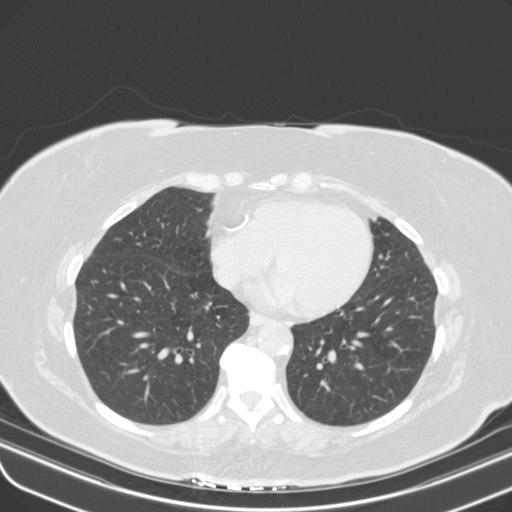
[im 68/159  lung]
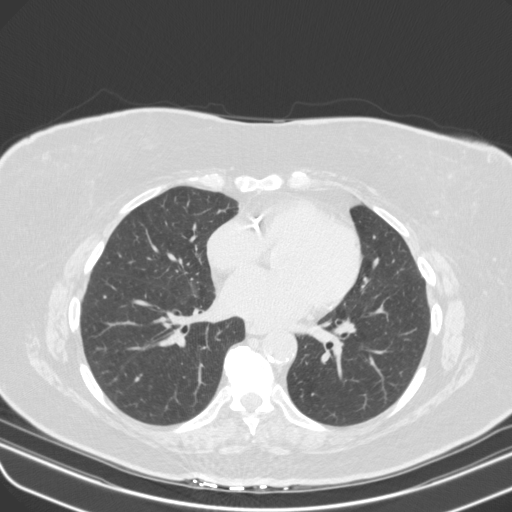
[im 75/159  lung]
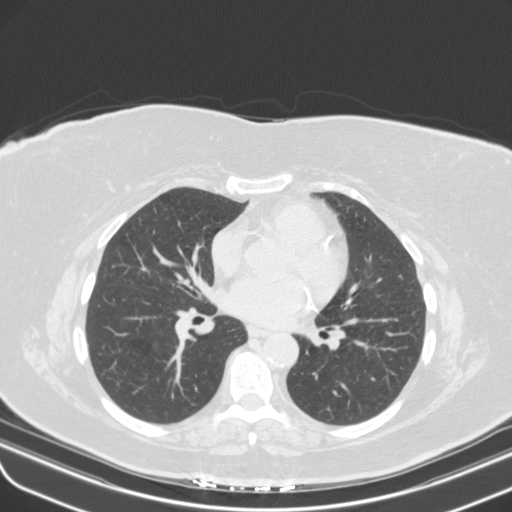
[im 80/159  lung]
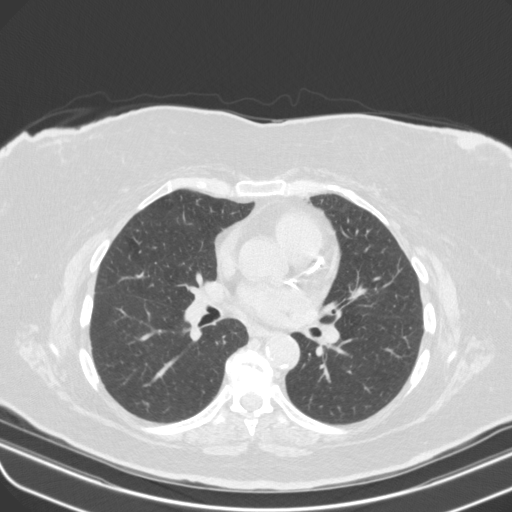
[im 91/159  mediastinal]
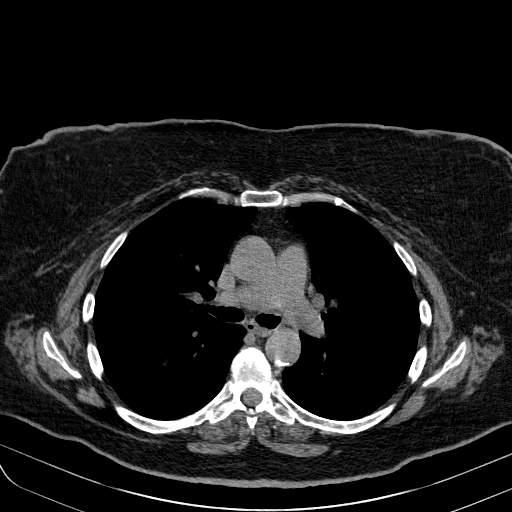
[im 91/159  lung]
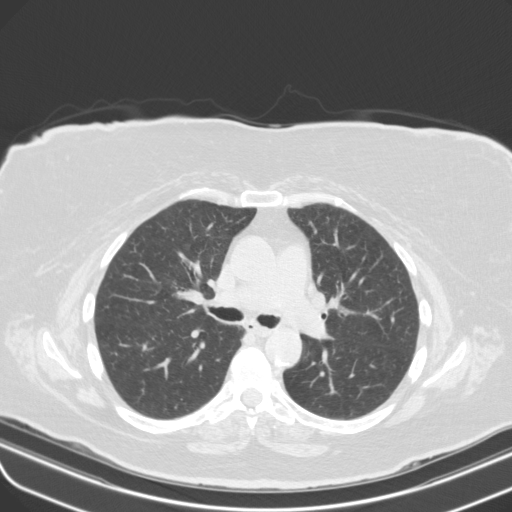
[im 102/159  lung]
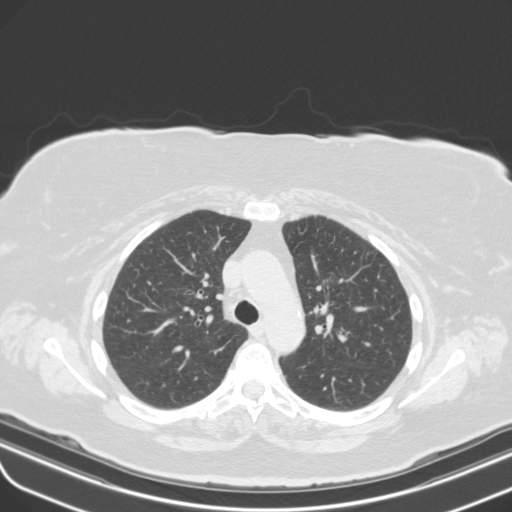
[im 113/159  lung]
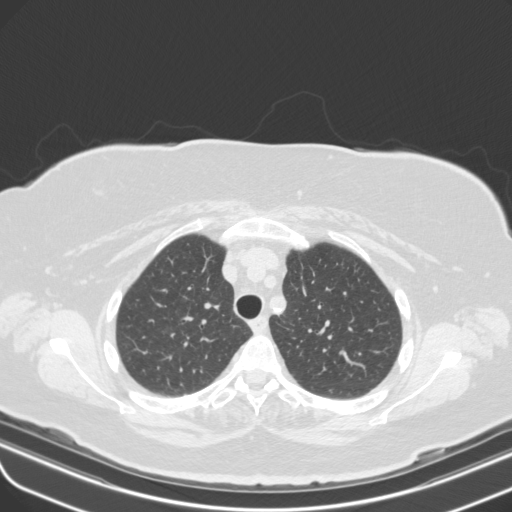
[im 125/159  lung]
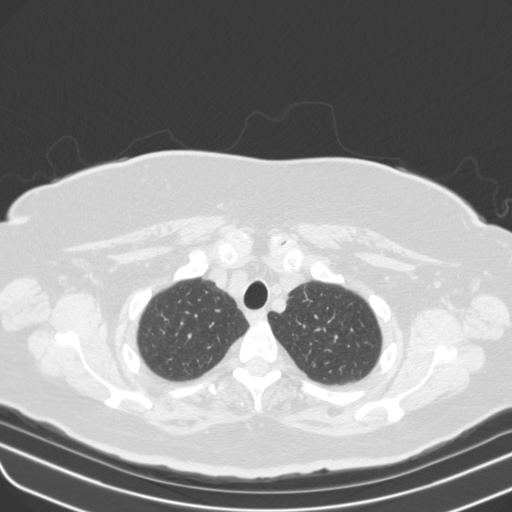
[im 136/159  mediastinal]
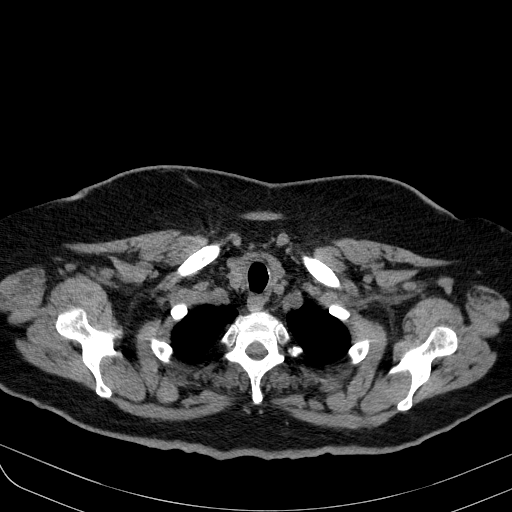
[im 136/159  lung]
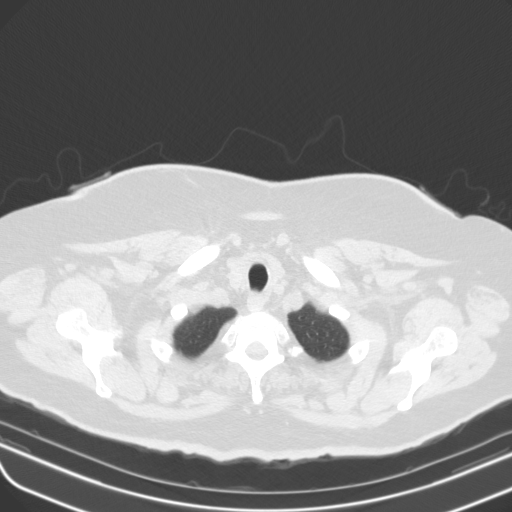
[im 147/159  lung]
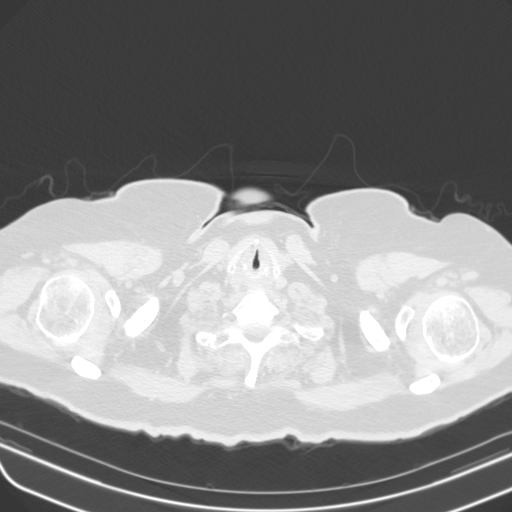

[14 of 29 positions shown; findings below may reference images not displayed]

FINDINGS: Cardiovascular: Somewhat limited due to the lack of IV contrast.
Atherosclerotic calcifications are noted without aneurysmal
dilatation. Coronary calcifications are seen. Cardiac shadow is not
significantly enlarged. No pericardial fluid is noted.

Mediastinum/Nodes: Thoracic inlet is unremarkable. No sizable hilar
or mediastinal adenopathy is noted. The esophagus is within normal
limits.

Lungs/Pleura: Lungs are well aerated bilaterally. Some linear
scarring is noted in the left lower lobe laterally. 6 mm subpleural
nodule is noted in the left upper lobe posteriorly. This is better
visualized on the current exam consistent with thinner slice
sections but the overall size has increased from 3-6 mm. This may
again be related to the difference in the imaging technique. The
lungs are otherwise clear. No other nodules are noted.

Upper Abdomen: No acute abnormality.

Musculoskeletal: Degenerative changes of the thoracic spine are
noted. No definitive rib abnormality is seen.
IMPRESSION: Left upper lobe nodule is noted which measures approximately 6 mm on
today's exam. The thinner slices on this exam may accentuate the
size of the nodule. Non-contrast chest CT at 6-12 months is
recommended. If the nodule is stable at time of repeat CT, then
future CT at 18-24 months (from today's scan) is considered optional
for low-risk patients, but is recommended for high-risk patients.
This recommendation follows the consensus statement: Guidelines for
Management of Incidental Pulmonary Nodules Detected on CT Images:

Mild left basilar scarring is seen.

Aortic Atherosclerosis (8STUE-R66.6).

## 2019-09-30 DIAGNOSIS — M810 Age-related osteoporosis without current pathological fracture: Secondary | ICD-10-CM | POA: Diagnosis not present

## 2019-09-30 DIAGNOSIS — E032 Hypothyroidism due to medicaments and other exogenous substances: Secondary | ICD-10-CM | POA: Diagnosis not present

## 2019-09-30 DIAGNOSIS — I10 Essential (primary) hypertension: Secondary | ICD-10-CM | POA: Diagnosis not present

## 2019-09-30 DIAGNOSIS — E118 Type 2 diabetes mellitus with unspecified complications: Secondary | ICD-10-CM | POA: Diagnosis not present

## 2019-10-03 DIAGNOSIS — I2510 Atherosclerotic heart disease of native coronary artery without angina pectoris: Secondary | ICD-10-CM | POA: Diagnosis not present

## 2019-10-03 DIAGNOSIS — I10 Essential (primary) hypertension: Secondary | ICD-10-CM | POA: Diagnosis not present

## 2019-10-03 DIAGNOSIS — I2584 Coronary atherosclerosis due to calcified coronary lesion: Secondary | ICD-10-CM | POA: Diagnosis not present

## 2019-10-03 DIAGNOSIS — Z0000 Encounter for general adult medical examination without abnormal findings: Secondary | ICD-10-CM | POA: Diagnosis not present

## 2019-10-03 DIAGNOSIS — E559 Vitamin D deficiency, unspecified: Secondary | ICD-10-CM | POA: Diagnosis not present

## 2019-10-03 DIAGNOSIS — B353 Tinea pedis: Secondary | ICD-10-CM | POA: Diagnosis not present

## 2019-10-14 DIAGNOSIS — Z1211 Encounter for screening for malignant neoplasm of colon: Secondary | ICD-10-CM | POA: Diagnosis not present

## 2019-10-14 DIAGNOSIS — Z1212 Encounter for screening for malignant neoplasm of rectum: Secondary | ICD-10-CM | POA: Diagnosis not present

## 2019-10-17 LAB — COLOGUARD: COLOGUARD: NEGATIVE

## 2019-10-25 ENCOUNTER — Encounter: Payer: Self-pay | Admitting: Cardiology

## 2019-11-22 ENCOUNTER — Other Ambulatory Visit: Payer: Self-pay | Admitting: *Deleted

## 2019-11-22 DIAGNOSIS — F17210 Nicotine dependence, cigarettes, uncomplicated: Secondary | ICD-10-CM

## 2019-11-22 DIAGNOSIS — Z87891 Personal history of nicotine dependence: Secondary | ICD-10-CM

## 2019-12-02 ENCOUNTER — Ambulatory Visit: Payer: Medicare Other | Admitting: Pulmonary Disease

## 2019-12-02 ENCOUNTER — Other Ambulatory Visit: Payer: Self-pay

## 2019-12-02 ENCOUNTER — Encounter: Payer: Self-pay | Admitting: Pulmonary Disease

## 2019-12-02 VITALS — BP 132/72 | HR 71 | Temp 97.6°F | Ht 61.0 in | Wt 165.5 lb

## 2019-12-02 DIAGNOSIS — F17210 Nicotine dependence, cigarettes, uncomplicated: Secondary | ICD-10-CM

## 2019-12-02 DIAGNOSIS — R911 Solitary pulmonary nodule: Secondary | ICD-10-CM | POA: Diagnosis not present

## 2019-12-02 NOTE — Progress Notes (Signed)
Katherine Martin    [id number]    Feb 01, 1946  Primary Care Physician:Pharr, Zollie Beckers, MD  Referring Physician: Merri Brunette, MD [address] [place],  [place] [address]  Chief complaint: Follow-up for lung nodule  HPI: 74 year old active smoker with coronary artery disease, diabetes, hyperlipidemia, hypothyroidism, hypertension for evaluation of lung nodule.  3 mm left upper lobe nodule seen initially in 2015.  She had a follow-up CT in 2019 which showed a persistent nodule measuring 6 mm.  Chief complaint is dyspnea with activity.  No symptoms at rest.  She has cough with white mucus production.  Denies any wheezing, fevers, chills, hemoptysis, loss of weight, loss of appetite.  Pets: No pets Occupation: Used to work as a [employer] operational officer, bookkeeper exposures: Smoking history: 53-pack-year smoker.  Continues to smoke 1 pack/day Travel history: Originally from [place].  No significant recent travel Relevant family history: No significant family history of lung issues.  Interim history: Doing well with regard to breathing.  Not on regular inhalers Still smoking 1 pack/day.  Outpatient Encounter Medications as of 12/02/2019  Medication Sig  . ACTOS 15 MG tablet Take 1 tablet by mouth every morning.   Marland Kitchen aspirin 81 MG tablet Take 81 mg by mouth at bedtime.   . calcium carbonate (TUMS - DOSED IN MG ELEMENTAL CALCIUM) 500 MG chewable tablet Chew 1 tablet by mouth daily as needed for indigestion or heartburn.  . cholecalciferol (VITAMIN D3) 25 MCG (1000 UNIT) tablet Take 1,000 Units by mouth daily.  Marland Kitchen PAXIL CR 25 MG 24 hr tablet Take 50 mg by mouth every evening.   Marland Kitchen SYNTHROID 125 MCG tablet Take 1 tablet by mouth daily before breakfast.   . valsartan (DIOVAN) 80 MG tablet Take 80 mg by mouth 2 (two) times daily.   No facility-administered encounter medications on file as of 12/02/2019.   Physical Exam: Blood pressure 132/72, pulse 71, temperature 97.6 F (36.4 C),  temperature source Oral, height 5\' 1"  (1.549 m), weight 165 lb 8 oz (75.1 kg), SpO2 97 %. Gen:      No acute distress HEENT:  EOMI, sclera anicteric Neck:     No masses; no thyromegaly Lungs:    Clear to auscultation bilaterally; normal respiratory effort CV:         Regular rate and rhythm; no murmurs Abd:      + bowel sounds; soft, non-tender; no palpable masses, no distension Ext:    No edema; adequate peripheral perfusion Skin:      Warm and dry; no rash Neuro: alert and oriented x 3 Psych: normal mood and affect  Data Reviewed: CT chest 06/03/2013- 3 mm left upper lobe nodule.  Extensive three-vessel coronary artery calcification CT chest 07/03/2017- 6 mm left upper lobe nodule, coronary artery calcifications CT chest 10/22/2018- stable 6 mm pulmonary nodule. I have reviewed the images personally.  Labs from primary care CBC 06/14/2017-WBC 8.9, eos 3.8%  Assessment:  Lung nodule Noted to have a subcentimeter left upper lobe nodule.  This may have grown from 2015-19 although the difference in size may be secondary to thinner slices on the recent scan.  The most recent CT reviewed with stability in the size She is transitioning to annual low-dose CTs of the chest later this year.   Coronary artery disease Follows with Dr. 03-28-1988  Active smoker Discussed smoking cessation but she is not ready to quit yet  She is currently asymptomatic and does not need any  inhalers.  Defer pulmonary function test as per patient preference.  Health maintenance Pneumovax 06/19/2017 Patient does not want flu vaccine Up-to-date with Covid vaccine  Plan/Recommendations: - Annual low-dose screening CTs of the chest. - Smoking cessation  Following pulmonary clinic as needed unless CT scans showed new or enlarging lung nodules  Chilton Greathouse MD [facility] 12/02/2019, 11:47 AM  CC: Merri Brunette, MD

## 2019-12-02 NOTE — Patient Instructions (Signed)
I am glad you are doing well with regard to your breathing Continue to work on smoking cessation Continue with annual CTs of the chest  You do not need to follow-up with [place] on a regular basis Return to clinic as needed.

## 2019-12-03 ENCOUNTER — Ambulatory Visit: Payer: Medicare Other | Admitting: Pulmonary Disease

## 2019-12-16 ENCOUNTER — Other Ambulatory Visit: Payer: Self-pay

## 2019-12-16 ENCOUNTER — Encounter: Payer: Self-pay | Admitting: Acute Care

## 2019-12-16 ENCOUNTER — Ambulatory Visit (INDEPENDENT_AMBULATORY_CARE_PROVIDER_SITE_OTHER)
Admission: RE | Admit: 2019-12-16 | Discharge: 2019-12-16 | Disposition: A | Discharge status: Home / Self Care | Payer: Medicare Other | Source: Ambulatory Visit | Attending: Acute Care | Admitting: Acute Care

## 2019-12-16 ENCOUNTER — Ambulatory Visit (INDEPENDENT_AMBULATORY_CARE_PROVIDER_SITE_OTHER): Payer: Medicare Other | Admitting: Acute Care

## 2019-12-16 VITALS — BP 130/72 | HR 65 | Temp 97.3°F | Ht 61.0 in | Wt 155.0 lb

## 2019-12-16 DIAGNOSIS — Z87891 Personal history of nicotine dependence: Secondary | ICD-10-CM | POA: Diagnosis not present

## 2019-12-16 DIAGNOSIS — Z122 Encounter for screening for malignant neoplasm of respiratory organs: Secondary | ICD-10-CM

## 2019-12-16 DIAGNOSIS — F17210 Nicotine dependence, cigarettes, uncomplicated: Secondary | ICD-10-CM

## 2019-12-16 NOTE — Patient Instructions (Signed)
Thank you for participating in the [facility]. It was our pleasure to meet you today. We will call you with the results of your scan within the next few days. Your scan will be assigned a Lung RADS category score by the physicians reading the scans.  This Lung RADS score determines follow up scanning.  See below for description of categories, and follow up screening recommendations. We will be in touch to schedule your follow up screening annually or based on recommendations of our providers. We will fax a copy of your scan results to your Primary Care Physician, or the physician who referred you to the program, to ensure they have the results. Please call the office if you have any questions or concerns regarding your scanning experience or results.  Our office number is [phone]. Please speak with Denise Phelps, RN. She is our Lung Cancer Screening RN. If she is unavailable when you call, please have the office staff send her a message. She will return your call at her earliest convenience. Remember, if your scan is normal, we will scan you annually as long as you continue to meet the criteria for the program. (Age 55-77, Current smoker or smoker who has quit within the last 15 years). If you are a smoker, remember, quitting is the single most powerful action that you can take to decrease your risk of lung cancer and other pulmonary, breathing related problems. We know quitting is hard, and we are here to help.  Please let us know if there is anything we can do to help you meet your goal of quitting. If you are a former smoker, congratulations. We are proud of you! Remain smoke free! Remember you can refer friends or family members through the number above.  We will screen them to make sure they meet criteria for the program. Thank you for helping us take better care of you by participating in Lung Screening.  Lung RADS Categories:  Lung RADS 1: no nodules  or definitely non-concerning nodules.  Recommendation is for a repeat annual scan in 12 months.  Lung RADS 2:  nodules that are non-concerning in appearance and behavior with a very low likelihood of becoming an active cancer. Recommendation is for a repeat annual scan in 12 months.  Lung RADS 3: nodules that are probably non-concerning , includes nodules with a low likelihood of becoming an active cancer.  Recommendation is for a 6-month repeat screening scan. Often noted after an upper respiratory illness. We will be in touch to make sure you have no questions, and to schedule your 6-month scan.  Lung RADS 4 A: nodules with concerning findings, recommendation is most often for a follow up scan in 3 months or additional testing based on our provider's assessment of the scan. We will be in touch to make sure you have no questions and to schedule the recommended 3 month follow up scan.  Lung RADS 4 B:  indicates findings that are concerning. We will be in touch with you to schedule additional diagnostic testing based on our provider's  assessment of the scan.   

## 2019-12-16 NOTE — Progress Notes (Signed)
Shared Decision Making Visit Lung Cancer Screening Program [phone])   Eligibility:  Age 74 y.o.  Pack Years Smoking History Calculation 52 pack year smoking history (# packs/per year x # years smoked)  Recent History of coughing up blood  no  Unexplained weight loss? no ( >Than 15 pounds within the last 6 months )  Prior History Lung / other cancer no (Diagnosis within the last 5 years already requiring surveillance chest CT Scans).  Smoking Status Current Smoker  Former Smokers: Years since quit: NA  Quit Date: NA  Visit Components:  Discussion included one or more decision making aids. yes  Discussion included risk/benefits of screening. yes  Discussion included potential follow up diagnostic testing for abnormal scans. yes  Discussion included meaning and risk of over diagnosis. yes  Discussion included meaning and risk of False Positives. yes  Discussion included meaning of total radiation exposure. yes  Counseling Included:  Importance of adherence to annual lung cancer LDCT screening. yes  Impact of comorbidities on ability to participate in the program. yes  Ability and willingness to under diagnostic treatment. yes  Smoking Cessation Counseling:  Current Smokers:   Discussed importance of smoking cessation. yes  Information about tobacco cessation classes and interventions provided to patient. yes  Patient provided with "ticket" for LDCT Scan. yes  Symptomatic Patient. no  Counseling  Diagnosis Code: Tobacco Use Z72.0  Asymptomatic Patient yes  Counseling (Intermediate counseling: > three minutes counseling) [id number]  Former Smokers:   Discussed the importance of maintaining cigarette abstinence. yes  Diagnosis Code: Personal History of Nicotine Dependence. Y63.785  Information about tobacco cessation classes and interventions provided to patient. Yes  Patient provided with "ticket" for LDCT Scan. yes  Written Order for Lung Cancer  Screening with LDCT placed in Epic. Yes (CT Chest Lung Cancer Screening Low Dose W/O CM) [id number] Z12.2-Screening of respiratory organs Z87.891-Personal history of nicotine dependence  BP 130/72 (BP Location: Left Arm, Cuff Size: Normal)   Pulse 65   Temp (!) 97.3 F (36.3 C) (Oral)   Ht 5\' 1"  (1.549 m)   Wt 155 lb (70.3 kg)   SpO2 100%   BMI 29.29 kg/m   I have spent 25 minutes of face to face time with Ms. Winiecki discussing the risks and benefits of lung cancer screening. We viewed a power point together that explained in detail the above noted topics. We paused at intervals to allow for questions to be asked and answered to ensure understanding.We discussed that the single most powerful action that she can take to decrease her risk of developing lung cancer is to quit smoking. We discussed whether or not she is ready to commit to setting a quit date. We discussed options for tools to aid in quitting smoking including nicotine replacement therapy, non-nicotine medications, support groups, Quit Smart classes, and behavior modification. We discussed that often times setting smaller, more achievable goals, such as eliminating 1 cigarette a day for a week and then 2 cigarettes a day for a week can be helpful in slowly decreasing the number of cigarettes smoked. This allows for a sense of accomplishment as well as providing a clinical benefit. I gave her the " Be Stronger Than Your Excuses" card with contact information for community resources, classes, free nicotine replacement therapy, and access to mobile apps, text messaging, and on-line smoking cessation help. I have also given her my card and contact information in the event she needs to contact me. We discussed the time and  location of the scan, and that either Abigail Miyamoto RN or I will call with the results within 24-48 hours of receiving them. I have offered her  a copy of the power point we viewed  as a resource in the event they need  reinforcement of the concepts we discussed today in the office. The patient verbalized understanding of all of  the above and had no further questions upon leaving the office. They have my contact information in the event they have any further questions.  I spent 3 minutes counseling on smoking cessation and the health risks of continued tobacco abuse.  I explained to the patient that there has been a high incidence of coronary artery disease noted on these exams. I explained that this is a non-gated exam therefore degree or severity cannot be determined. This patient is not currently  on statin therapy. I have asked the patient to follow-up with their PCP regarding any incidental finding of coronary artery disease and management with diet or medication as their PCP  feels is clinically indicated. The patient verbalized understanding of the above and had no further questions upon completion of the visit.      Bevelyn Ngo, NP 12/16/2019 2:11 PM

## 2019-12-19 DIAGNOSIS — F4310 Post-traumatic stress disorder, unspecified: Secondary | ICD-10-CM | POA: Diagnosis not present

## 2019-12-19 DIAGNOSIS — F331 Major depressive disorder, recurrent, moderate: Secondary | ICD-10-CM | POA: Diagnosis not present

## 2019-12-19 NOTE — Progress Notes (Signed)
Please call patient and let them  know their  low dose Ct was read as a Lung RADS 2: nodules that are benign in appearance and behavior with a very low likelihood of becoming a clinically active cancer due to size or lack of growth. Recommendation per radiology is for a repeat LDCT in 12 months. .Please let them  know we will order and schedule their  annual screening scan for 11/2020. Please let them  know there was notation of CAD on their  scan.  Please remind the patient  that this is a non-gated exam therefore degree or severity of disease  cannot be determined. Please have them  follow up with their PCP regarding potential risk factor modification, dietary therapy or pharmacologic therapy if clinically indicated. °Pt.  is  not currently on statin therapy. Please place order for annual  screening scan for  11/2020 and fax results to PCP. Thanks so much. °

## 2019-12-20 ENCOUNTER — Other Ambulatory Visit: Payer: Self-pay | Admitting: *Deleted

## 2019-12-20 DIAGNOSIS — F17210 Nicotine dependence, cigarettes, uncomplicated: Secondary | ICD-10-CM

## 2019-12-20 DIAGNOSIS — Z122 Encounter for screening for malignant neoplasm of respiratory organs: Secondary | ICD-10-CM

## 2019-12-20 DIAGNOSIS — Z87891 Personal history of nicotine dependence: Secondary | ICD-10-CM

## 2020-06-18 DIAGNOSIS — F4310 Post-traumatic stress disorder, unspecified: Secondary | ICD-10-CM | POA: Diagnosis not present

## 2020-06-18 DIAGNOSIS — F331 Major depressive disorder, recurrent, moderate: Secondary | ICD-10-CM | POA: Diagnosis not present

## 2020-09-09 DIAGNOSIS — E119 Type 2 diabetes mellitus without complications: Secondary | ICD-10-CM | POA: Diagnosis not present

## 2020-09-09 DIAGNOSIS — E1165 Type 2 diabetes mellitus with hyperglycemia: Secondary | ICD-10-CM | POA: Diagnosis not present

## 2020-10-05 DIAGNOSIS — E032 Hypothyroidism due to medicaments and other exogenous substances: Secondary | ICD-10-CM | POA: Diagnosis not present

## 2020-10-05 DIAGNOSIS — I10 Essential (primary) hypertension: Secondary | ICD-10-CM | POA: Diagnosis not present

## 2020-10-05 DIAGNOSIS — E118 Type 2 diabetes mellitus with unspecified complications: Secondary | ICD-10-CM | POA: Diagnosis not present

## 2020-10-12 DIAGNOSIS — I10 Essential (primary) hypertension: Secondary | ICD-10-CM | POA: Diagnosis not present

## 2020-10-12 DIAGNOSIS — B353 Tinea pedis: Secondary | ICD-10-CM | POA: Diagnosis not present

## 2020-10-12 DIAGNOSIS — E039 Hypothyroidism, unspecified: Secondary | ICD-10-CM | POA: Diagnosis not present

## 2020-10-12 DIAGNOSIS — Z0000 Encounter for general adult medical examination without abnormal findings: Secondary | ICD-10-CM | POA: Diagnosis not present

## 2021-01-26 ENCOUNTER — Other Ambulatory Visit: Payer: Self-pay

## 2021-01-26 DIAGNOSIS — Z87891 Personal history of nicotine dependence: Secondary | ICD-10-CM

## 2021-01-26 DIAGNOSIS — F17210 Nicotine dependence, cigarettes, uncomplicated: Secondary | ICD-10-CM

## 2021-02-05 ENCOUNTER — Other Ambulatory Visit: Payer: Self-pay

## 2021-02-05 ENCOUNTER — Ambulatory Visit (INDEPENDENT_AMBULATORY_CARE_PROVIDER_SITE_OTHER)
Admission: RE | Admit: 2021-02-05 | Discharge: 2021-02-05 | Disposition: A | Discharge status: Home / Self Care | Payer: Medicare Other | Source: Ambulatory Visit | Attending: Acute Care | Admitting: Acute Care

## 2021-02-05 DIAGNOSIS — F17210 Nicotine dependence, cigarettes, uncomplicated: Secondary | ICD-10-CM

## 2021-02-05 DIAGNOSIS — Z87891 Personal history of nicotine dependence: Secondary | ICD-10-CM | POA: Diagnosis not present

## 2021-02-08 ENCOUNTER — Other Ambulatory Visit: Payer: Self-pay | Admitting: Acute Care

## 2021-02-08 DIAGNOSIS — F17200 Nicotine dependence, unspecified, uncomplicated: Secondary | ICD-10-CM

## 2021-02-08 DIAGNOSIS — Z87891 Personal history of nicotine dependence: Secondary | ICD-10-CM

## 2021-02-08 DIAGNOSIS — F17210 Nicotine dependence, cigarettes, uncomplicated: Secondary | ICD-10-CM

## 2021-06-10 DIAGNOSIS — F331 Major depressive disorder, recurrent, moderate: Secondary | ICD-10-CM | POA: Diagnosis not present

## 2021-06-10 DIAGNOSIS — F4310 Post-traumatic stress disorder, unspecified: Secondary | ICD-10-CM | POA: Diagnosis not present

## 2021-10-15 DIAGNOSIS — E039 Hypothyroidism, unspecified: Secondary | ICD-10-CM | POA: Diagnosis not present

## 2021-10-15 DIAGNOSIS — M810 Age-related osteoporosis without current pathological fracture: Secondary | ICD-10-CM | POA: Diagnosis not present

## 2021-10-15 DIAGNOSIS — E118 Type 2 diabetes mellitus with unspecified complications: Secondary | ICD-10-CM | POA: Diagnosis not present

## 2021-10-15 DIAGNOSIS — I10 Essential (primary) hypertension: Secondary | ICD-10-CM | POA: Diagnosis not present

## 2021-10-18 DIAGNOSIS — E039 Hypothyroidism, unspecified: Secondary | ICD-10-CM | POA: Diagnosis not present

## 2021-10-18 DIAGNOSIS — I10 Essential (primary) hypertension: Secondary | ICD-10-CM | POA: Diagnosis not present

## 2021-10-18 DIAGNOSIS — E118 Type 2 diabetes mellitus with unspecified complications: Secondary | ICD-10-CM | POA: Diagnosis not present

## 2021-10-18 DIAGNOSIS — M810 Age-related osteoporosis without current pathological fracture: Secondary | ICD-10-CM | POA: Diagnosis not present

## 2021-10-21 DIAGNOSIS — I2510 Atherosclerotic heart disease of native coronary artery without angina pectoris: Secondary | ICD-10-CM | POA: Diagnosis not present

## 2021-10-21 DIAGNOSIS — Z0000 Encounter for general adult medical examination without abnormal findings: Secondary | ICD-10-CM | POA: Diagnosis not present

## 2021-10-21 DIAGNOSIS — M810 Age-related osteoporosis without current pathological fracture: Secondary | ICD-10-CM | POA: Diagnosis not present

## 2021-10-21 DIAGNOSIS — E118 Type 2 diabetes mellitus with unspecified complications: Secondary | ICD-10-CM | POA: Diagnosis not present

## 2021-11-25 DIAGNOSIS — Z049 Encounter for examination and observation for unspecified reason: Secondary | ICD-10-CM | POA: Diagnosis not present

## 2021-11-25 DIAGNOSIS — R0989 Other specified symptoms and signs involving the circulatory and respiratory systems: Secondary | ICD-10-CM | POA: Diagnosis not present

## 2021-11-30 DIAGNOSIS — I2510 Atherosclerotic heart disease of native coronary artery without angina pectoris: Secondary | ICD-10-CM | POA: Diagnosis not present

## 2021-11-30 DIAGNOSIS — M8589 Other specified disorders of bone density and structure, multiple sites: Secondary | ICD-10-CM | POA: Diagnosis not present

## 2021-11-30 DIAGNOSIS — I739 Peripheral vascular disease, unspecified: Secondary | ICD-10-CM | POA: Diagnosis not present

## 2021-11-30 DIAGNOSIS — M810 Age-related osteoporosis without current pathological fracture: Secondary | ICD-10-CM | POA: Diagnosis not present

## 2021-11-30 DIAGNOSIS — Z8673 Personal history of transient ischemic attack (TIA), and cerebral infarction without residual deficits: Secondary | ICD-10-CM | POA: Diagnosis not present

## 2021-11-30 DIAGNOSIS — E785 Hyperlipidemia, unspecified: Secondary | ICD-10-CM | POA: Diagnosis not present

## 2021-12-09 DIAGNOSIS — F331 Major depressive disorder, recurrent, moderate: Secondary | ICD-10-CM | POA: Diagnosis not present

## 2021-12-09 DIAGNOSIS — F4310 Post-traumatic stress disorder, unspecified: Secondary | ICD-10-CM | POA: Diagnosis not present

## 2021-12-22 DIAGNOSIS — E039 Hypothyroidism, unspecified: Secondary | ICD-10-CM | POA: Diagnosis not present

## 2022-02-07 ENCOUNTER — Ambulatory Visit (HOSPITAL_BASED_OUTPATIENT_CLINIC_OR_DEPARTMENT_OTHER)
Admission: RE | Admit: 2022-02-07 | Discharge: 2022-02-07 | Disposition: A | Discharge status: Home / Self Care | Payer: Medicare Other | Source: Ambulatory Visit | Attending: Internal Medicine | Admitting: Internal Medicine

## 2022-02-07 DIAGNOSIS — F17200 Nicotine dependence, unspecified, uncomplicated: Secondary | ICD-10-CM | POA: Diagnosis not present

## 2022-02-07 DIAGNOSIS — F17210 Nicotine dependence, cigarettes, uncomplicated: Secondary | ICD-10-CM | POA: Insufficient documentation

## 2022-02-07 DIAGNOSIS — Z87891 Personal history of nicotine dependence: Secondary | ICD-10-CM | POA: Insufficient documentation

## 2022-02-08 ENCOUNTER — Other Ambulatory Visit: Payer: Self-pay | Admitting: Acute Care

## 2022-02-08 DIAGNOSIS — Z87891 Personal history of nicotine dependence: Secondary | ICD-10-CM

## 2022-02-08 DIAGNOSIS — F17210 Nicotine dependence, cigarettes, uncomplicated: Secondary | ICD-10-CM

## 2022-02-08 DIAGNOSIS — Z122 Encounter for screening for malignant neoplasm of respiratory organs: Secondary | ICD-10-CM

## 2022-03-24 DIAGNOSIS — M791 Myalgia, unspecified site: Secondary | ICD-10-CM | POA: Diagnosis not present

## 2022-03-24 DIAGNOSIS — M810 Age-related osteoporosis without current pathological fracture: Secondary | ICD-10-CM | POA: Diagnosis not present

## 2022-03-24 DIAGNOSIS — T466X5A Adverse effect of antihyperlipidemic and antiarteriosclerotic drugs, initial encounter: Secondary | ICD-10-CM | POA: Diagnosis not present

## 2022-05-12 DIAGNOSIS — F4310 Post-traumatic stress disorder, unspecified: Secondary | ICD-10-CM | POA: Diagnosis not present

## 2022-05-12 DIAGNOSIS — F331 Major depressive disorder, recurrent, moderate: Secondary | ICD-10-CM | POA: Diagnosis not present

## 2022-10-28 DIAGNOSIS — I10 Essential (primary) hypertension: Secondary | ICD-10-CM | POA: Diagnosis not present

## 2022-10-28 DIAGNOSIS — E032 Hypothyroidism due to medicaments and other exogenous substances: Secondary | ICD-10-CM | POA: Diagnosis not present

## 2022-10-28 DIAGNOSIS — E118 Type 2 diabetes mellitus with unspecified complications: Secondary | ICD-10-CM | POA: Diagnosis not present

## 2022-11-03 DIAGNOSIS — E875 Hyperkalemia: Secondary | ICD-10-CM | POA: Diagnosis not present

## 2022-11-03 DIAGNOSIS — I6523 Occlusion and stenosis of bilateral carotid arteries: Secondary | ICD-10-CM | POA: Diagnosis not present

## 2022-11-03 DIAGNOSIS — I10 Essential (primary) hypertension: Secondary | ICD-10-CM | POA: Diagnosis not present

## 2022-11-03 DIAGNOSIS — E039 Hypothyroidism, unspecified: Secondary | ICD-10-CM | POA: Diagnosis not present

## 2022-11-03 DIAGNOSIS — Z720 Tobacco use: Secondary | ICD-10-CM | POA: Diagnosis not present

## 2022-11-03 DIAGNOSIS — G720 Drug-induced myopathy: Secondary | ICD-10-CM | POA: Diagnosis not present

## 2022-11-03 DIAGNOSIS — Z0000 Encounter for general adult medical examination without abnormal findings: Secondary | ICD-10-CM | POA: Diagnosis not present

## 2022-11-09 DIAGNOSIS — F331 Major depressive disorder, recurrent, moderate: Secondary | ICD-10-CM | POA: Diagnosis not present

## 2022-11-09 DIAGNOSIS — F4310 Post-traumatic stress disorder, unspecified: Secondary | ICD-10-CM | POA: Diagnosis not present

## 2022-11-20 DIAGNOSIS — Z1212 Encounter for screening for malignant neoplasm of rectum: Secondary | ICD-10-CM | POA: Diagnosis not present

## 2022-11-20 DIAGNOSIS — Z1211 Encounter for screening for malignant neoplasm of colon: Secondary | ICD-10-CM | POA: Diagnosis not present

## 2022-11-26 LAB — EXTERNAL GENERIC LAB PROCEDURE: COLOGUARD: POSITIVE — AB

## 2022-11-26 LAB — COLOGUARD: COLOGUARD: POSITIVE — AB

## 2022-12-15 ENCOUNTER — Other Ambulatory Visit: Payer: Self-pay | Admitting: *Deleted

## 2022-12-15 DIAGNOSIS — R0989 Other specified symptoms and signs involving the circulatory and respiratory systems: Secondary | ICD-10-CM

## 2022-12-20 NOTE — Progress Notes (Unsigned)
Office Note     CC: Reestablish care with known carotid stenosis Requesting Provider:  Merri Brunette, MD  HPI: Katherine Martin is a 77 y.o. ([birth year]) female presenting at the request of .Merri Brunette, MD ***  The pt is *** on a statin for cholesterol management.  The pt is *** on a daily aspirin.   Other AC:  *** The pt is *** on medication for hypertension.   The pt is *** diabetic.  Tobacco hx:  ***  Past Medical History:  Diagnosis Date   Anxiety    Complication of anesthesia 1990s   "had trouble waking me up"   Coronary artery disease, occlusive    cath 2007  - RCA 100%, Severe distal Circumflex   Daily headache    "last couple weeks" (07/09/2015)   Depression    Dyslipidemia    Hypertension    Hypothyroidism    Obesity (BMI 30.0-34.9)    PONV (postoperative nausea and vomiting)    PVD (peripheral vascular disease) (HCC)    LAA Dopplers, November 2013: ABIs--0.67 right, 0.65 left bilateral common iliacs less than 50% stenosis, bilateral SFA demonstrate exclusive disease with reconstitution in the proximal segment of bronchial arteries.) He artery occluded with 2 vessel runoff on the right, 3 vessel run off on left.   Tobacco use    Type 2 diabetes mellitus (HCC)     Past Surgical History:  Procedure Laterality Date   CARDIAC CATHETERIZATION  03/06/2006   100% RCA, Severe distal Circumflex stenosis, moderate LAD; EF ~60% with mild anterior hypokinesis. (Dr. Richarda Blade)   CHOLECYSTECTOMY OPEN     DILATION AND CURETTAGE OF UTERUS     Lower Extremity Arterial Doppler  01/2012   bilateral ABI demonstrate moderate arterial insuff at rest; abd aorta with non-hemodynamically significant plaque; bilat CIA with equal/less than 50% diameter reduction; bilat SFA with occlusive disease & reconstiution in prox popliteal; R peroneal not visualized   Lower Extremity Arterial Doppler  September 2014   RABI: 0.55; LABI 0.53; moderate CEA, bilateral SFA-occlusive with reconstitution  and proximal Popliteal; bilateral Peroneal artery occlusion.   [facility] - Treadmill  02/2009   Walk 6 minutes. No ischemia or infarction.   TONSILLECTOMY      Social History   Socioeconomic History   Marital status: Widowed    Spouse name: Not on file   Number of children: 1   Years of education: 85   Highest education level: Not on file  Occupational History    Employer: OTHER  Tobacco Use   Smoking status: Every Day    Current packs/day: 1.00    Average packs/day: 1 pack/day for 50.0 years (50.0 ttl pk-yrs)    Types: Cigarettes   Smokeless tobacco: Never  Vaping Use   Vaping status: Former  Substance and Sexual Activity   Alcohol use: No   Drug use: No   Sexual activity: Never  Other Topics Concern   Not on file  Social History Narrative   She is a widowed mother of one. She continues to smoke a pack a day, as she has for about 40 years. She is trying to pick up her exercise level but does not really do much with exercise. She says she is basically lazy   Social Determinants of Corporate investment banker Strain: Not on file  Food Insecurity: Not on file  Transportation Needs: Not on file  Physical Activity: Not on file  Stress: Not on file  Social Connections: Not  on file  Intimate Partner Violence: Not on file   *** Family History  Problem Relation Age of Onset   Heart attack Father 36   Heart failure Paternal Grandmother    Heart attack Paternal Grandfather    Heart attack Brother        CABG   Heart attack Sister     Current Outpatient Medications  Medication Sig Dispense Refill   ACTOS 15 MG tablet Take 1 tablet by mouth every morning.      aspirin 81 MG tablet Take 81 mg by mouth at bedtime.      calcium carbonate (TUMS - DOSED IN MG ELEMENTAL CALCIUM) 500 MG chewable tablet Chew 1 tablet by mouth daily as needed for indigestion or heartburn.     cholecalciferol (VITAMIN D3) 25 MCG (1000 UNIT) tablet Take 1,000 Units by mouth daily.     PAXIL  CR 25 MG 24 hr tablet Take 50 mg by mouth every evening.      SYNTHROID 125 MCG tablet Take 1 tablet by mouth daily before breakfast.      valsartan (DIOVAN) 80 MG tablet Take 80 mg by mouth 2 (two) times daily.     No current facility-administered medications for this visit.    Allergies  Allergen Reactions   Clindamycin/Lincomycin Anaphylaxis   Buspar [Buspirone]    Crestor [Rosuvastatin Calcium] Other (See Comments)    Extreme weakness and vomiting    Demerol [Meperidine] Hives   Effexor [Venlafaxine]    Erythromycin Hives   Levoxyl [Levothyroxine]    Other Hives    peaches   Peach [Prunus Persica] Hives   Penicillins Hives    Did it involve swelling of the face/tongue/throat, SOB, or low BP? No Did it involve sudden or severe rash/hives, skin peeling, or any reaction on the inside of your mouth or nose? Yes Did you need to seek medical attention at a hospital or doctor's office? Yes When did it last happen?      a long time ago If all above answers are "NO", may proceed with cephalosporin use.    Prednisone Hives   Sular [Nisoldipine Er]    Zithromax [Azithromycin]    Zoloft [Sertraline Hcl]      REVIEW OF SYSTEMS:  *** [X]  denotes positive finding, [ ]  denotes negative finding Cardiac  Comments:  Chest pain or chest pressure:    Shortness of breath upon exertion:    Short of breath when lying flat:    Irregular heart rhythm:        Vascular    Pain in calf, thigh, or hip brought on by ambulation:    Pain in feet at night that wakes you up from your sleep:     Blood clot in your veins:    Leg swelling:         Pulmonary    Oxygen at home:    Productive cough:     Wheezing:         Neurologic    Sudden weakness in arms or legs:     Sudden numbness in arms or legs:     Sudden onset of difficulty speaking or slurred speech:    Temporary loss of vision in one eye:     Problems with dizziness:         Gastrointestinal    Blood in stool:     Vomited blood:          Genitourinary    Burning when urinating:  Blood in urine:        Psychiatric    Major depression:         Hematologic    Bleeding problems:    Problems with blood clotting too easily:        Skin    Rashes or ulcers:        Constitutional    Fever or chills:      PHYSICAL EXAMINATION:  There were no vitals filed for this visit.  General:  WDWN in NAD; vital signs documented above Gait: Not observed HENT: WNL, normocephalic Pulmonary: normal non-labored breathing , without wheezing Cardiac: {Desc; regular/irreg:14544} HR Abdomen: soft, NT, no masses Skin: {With/Without:20273} rashes Vascular Exam/Pulses:  Right Left  Radial {Exam; arterial pulse strength 0-4:30167} {Exam; arterial pulse strength 0-4:30167}  Ulnar {Exam; arterial pulse strength 0-4:30167} {Exam; arterial pulse strength 0-4:30167}  Femoral {Exam; arterial pulse strength 0-4:30167} {Exam; arterial pulse strength 0-4:30167}  Popliteal {Exam; arterial pulse strength 0-4:30167} {Exam; arterial pulse strength 0-4:30167}  DP {Exam; arterial pulse strength 0-4:30167} {Exam; arterial pulse strength 0-4:30167}  PT {Exam; arterial pulse strength 0-4:30167} {Exam; arterial pulse strength 0-4:30167}   Extremities: {With/Without:20273} ischemic changes, {With/Without:20273} Gangrene , {With/Without:20273} cellulitis; {With/Without:20273} open wounds;  Musculoskeletal: no muscle wasting or atrophy  Neurologic: A&O X 3;  No focal weakness or paresthesias are detected Psychiatric:  The pt has {Desc; normal/abnormal:11317::"Normal"} affect.   Non-Invasive Vascular Imaging:   ***    ASSESSMENT/PLAN: Katherine Martin is a 77 y.o. female presenting with ***   ***   Victorino Sparrow, MD [facility] [phone]

## 2022-12-22 ENCOUNTER — Ambulatory Visit: Payer: Medicare Other | Admitting: Vascular Surgery

## 2022-12-22 ENCOUNTER — Encounter: Payer: Self-pay | Admitting: Vascular Surgery

## 2022-12-22 ENCOUNTER — Ambulatory Visit (HOSPITAL_COMMUNITY)
Admission: RE | Admit: 2022-12-22 | Discharge: 2022-12-22 | Disposition: A | Discharge status: Home / Self Care | Payer: Medicare Other | Source: Ambulatory Visit | Attending: Vascular Surgery | Admitting: Vascular Surgery

## 2022-12-22 VITALS — BP 136/72 | HR 61 | Temp 97.9°F | Resp 20 | Ht 61.0 in | Wt 149.0 lb

## 2022-12-22 DIAGNOSIS — R0989 Other specified symptoms and signs involving the circulatory and respiratory systems: Secondary | ICD-10-CM | POA: Insufficient documentation

## 2022-12-22 DIAGNOSIS — I6523 Occlusion and stenosis of bilateral carotid arteries: Secondary | ICD-10-CM | POA: Diagnosis not present

## 2023-01-17 ENCOUNTER — Other Ambulatory Visit: Payer: Self-pay

## 2023-01-17 DIAGNOSIS — I6523 Occlusion and stenosis of bilateral carotid arteries: Secondary | ICD-10-CM

## 2023-02-09 ENCOUNTER — Ambulatory Visit (HOSPITAL_BASED_OUTPATIENT_CLINIC_OR_DEPARTMENT_OTHER)
Admission: RE | Admit: 2023-02-09 | Discharge: 2023-02-09 | Disposition: A | Discharge status: Home / Self Care | Payer: Medicare Other | Source: Ambulatory Visit | Attending: Acute Care | Admitting: Acute Care

## 2023-02-09 DIAGNOSIS — Z87891 Personal history of nicotine dependence: Secondary | ICD-10-CM | POA: Insufficient documentation

## 2023-02-09 DIAGNOSIS — Z122 Encounter for screening for malignant neoplasm of respiratory organs: Secondary | ICD-10-CM | POA: Diagnosis not present

## 2023-02-09 DIAGNOSIS — F17210 Nicotine dependence, cigarettes, uncomplicated: Secondary | ICD-10-CM | POA: Insufficient documentation

## 2023-03-03 ENCOUNTER — Other Ambulatory Visit: Payer: Self-pay

## 2023-03-03 DIAGNOSIS — F17210 Nicotine dependence, cigarettes, uncomplicated: Secondary | ICD-10-CM

## 2023-03-03 DIAGNOSIS — Z122 Encounter for screening for malignant neoplasm of respiratory organs: Secondary | ICD-10-CM

## 2023-03-03 DIAGNOSIS — Z87891 Personal history of nicotine dependence: Secondary | ICD-10-CM

## 2023-05-11 DIAGNOSIS — F331 Major depressive disorder, recurrent, moderate: Secondary | ICD-10-CM | POA: Diagnosis not present

## 2023-05-11 DIAGNOSIS — F4310 Post-traumatic stress disorder, unspecified: Secondary | ICD-10-CM | POA: Diagnosis not present

## 2023-06-22 ENCOUNTER — Ambulatory Visit (HOSPITAL_COMMUNITY)
Admission: RE | Admit: 2023-06-22 | Discharge: 2023-06-22 | Disposition: A | Discharge status: Home / Self Care | Payer: Medicare Other | Source: Ambulatory Visit | Attending: Vascular Surgery | Admitting: Vascular Surgery

## 2023-06-22 ENCOUNTER — Ambulatory Visit: Payer: Medicare Other | Admitting: Physician Assistant

## 2023-06-22 VITALS — BP 163/65 | HR 63 | Temp 97.5°F | Ht 61.0 in | Wt 149.2 lb

## 2023-06-22 DIAGNOSIS — I6523 Occlusion and stenosis of bilateral carotid arteries: Secondary | ICD-10-CM

## 2023-06-23 ENCOUNTER — Other Ambulatory Visit: Payer: Self-pay | Admitting: *Deleted

## 2023-06-23 DIAGNOSIS — I6523 Occlusion and stenosis of bilateral carotid arteries: Secondary | ICD-10-CM

## 2023-06-25 NOTE — Progress Notes (Signed)
 Office Note   History of Present Illness   Katherine Martin is a 78 y.o. (Jul 03, 1945) female who presents for surveillance of carotid artery stenosis.  She has a known history of asymptomatic right ICA 1 to 39% stenosis and left ICA 60 to 79% stenosis.  She has no prior history of carotid interventions.  She has no prior history of CVA or TIA.  She does have a history of Bell's palsy, however this is resolved.  The patient returns today for follow up. She denies any recent strokelike symptoms such as slurred speech, facial droop, sudden visual changes, or sudden weakness/numbness.  She is a current smoker.  Current Outpatient Medications  Medication Sig Dispense Refill   ACTOS 15 MG tablet Take 1 tablet by mouth every morning.      aspirin 81 MG tablet Take 81 mg by mouth at bedtime.      calcium carbonate (TUMS - DOSED IN MG ELEMENTAL CALCIUM) 500 MG chewable tablet Chew 1 tablet by mouth daily as needed for indigestion or heartburn.     cholecalciferol (VITAMIN D3) 25 MCG (1000 UNIT) tablet Take 1,000 Units by mouth daily.     PAXIL CR 25 MG 24 hr tablet Take 50 mg by mouth every evening.      REPATHA SURECLICK 140 MG/ML SOAJ      SYNTHROID 112 MCG tablet Take 112 mcg by mouth daily before breakfast.     valsartan (DIOVAN) 80 MG tablet Take 80 mg by mouth 2 (two) times daily.     No current facility-administered medications for this visit.    REVIEW OF SYSTEMS (negative unless checked):   Cardiac:  []  Chest pain or chest pressure? []  Shortness of breath upon activity? []  Shortness of breath when lying flat? []  Irregular heart rhythm?  Vascular:  []  Pain in calf, thigh, or hip brought on by walking? []  Pain in feet at night that wakes you up from your sleep? []  Blood clot in your veins? []  Leg swelling?  Pulmonary:  []  Oxygen at home? []  Productive cough? []  Wheezing?  Neurologic:  []  Sudden weakness in arms or legs? []  Sudden numbness in arms or legs? []  Sudden  onset of difficult speaking or slurred speech? []  Temporary loss of vision in one eye? []  Problems with dizziness?  Gastrointestinal:  []  Blood in stool? []  Vomited blood?  Genitourinary:  []  Burning when urinating? []  Blood in urine?  Psychiatric:  []  Major depression  Hematologic:  []  Bleeding problems? []  Problems with blood clotting?  Dermatologic:  []  Rashes or ulcers?  Constitutional:  []  Fever or chills?  Ear/Nose/Throat:  []  Change in hearing? []  Nose bleeds? []  Sore throat?  Musculoskeletal:  []  Back pain? []  Joint pain? []  Muscle pain?   Physical Examination   Vitals:   06/22/23 1349 06/22/23 1351  BP: (!) 176/77 (!) 163/65  Pulse: 63   Temp: (!) 97.5 F (36.4 C)   TempSrc: Temporal   SpO2: 98%   Weight: 149 lb 3.2 oz (67.7 kg)   Height: 5\' 1"  (1.549 m)    Body mass index is 28.19 kg/m.  General:  WDWN in NAD; vital signs documented above Gait: Not observed HENT: WNL, normocephalic Pulmonary: normal non-labored breathing , without rales, rhonchi,  wheezing Cardiac: Regular Abdomen: soft, NT, no masses Skin: without rashes Vascular Exam/Pulses: palpable radial pulses bilaterally Extremities: without ischemic changes, without gangrene , without cellulitis; without open wounds;  Musculoskeletal: no muscle wasting or atrophy  Neurologic: A&O  X 3;  No focal weakness or paresthesias are detected Psychiatric:  The pt has Normal affect.  Non-Invasive Vascular Imaging   Bilateral Carotid Duplex (06/22/2023):  R ICA stenosis:  1-39% R VA:  patent and antegrade L ICA stenosis:  60-79% L VA:  patent and antegrade   Medical Decision Making   Katherine Martin is a 78 y.o. female who presents for surveillance of carotid artery stenosis  Based on the patient's vascular studies, her carotid artery stenosis is unchanged bilaterally.  She has right ICA 1 to 39% stenosis and left ICA 60 to 79% stenosis She denies any strokelike symptoms such as slurred  speech, facial droop, sudden visual changes, or sudden weakness/numbness. She has no motor or sensory deficits on exam.  She has palpable nuchal radial pulses bilaterally. She will continue her daily aspirin and repatha and follow-up with our office in 6 months with carotid duplex   Loel Dubonnet PA-C [facility] [place] [facility] Office: [phone]  Clinic MD: Karin Lieu

## 2023-09-03 ENCOUNTER — Emergency Department (HOSPITAL_COMMUNITY)
Admission: EM | Admit: 2023-09-03 | Discharge: 2023-09-03 | Disposition: A | Discharge status: Home / Self Care | Attending: Emergency Medicine | Admitting: Emergency Medicine

## 2023-09-03 ENCOUNTER — Other Ambulatory Visit: Payer: Self-pay

## 2023-09-03 DIAGNOSIS — Z7982 Long term (current) use of aspirin: Secondary | ICD-10-CM | POA: Insufficient documentation

## 2023-09-03 DIAGNOSIS — R531 Weakness: Secondary | ICD-10-CM | POA: Diagnosis not present

## 2023-09-03 DIAGNOSIS — R42 Dizziness and giddiness: Secondary | ICD-10-CM | POA: Insufficient documentation

## 2023-09-03 DIAGNOSIS — G4489 Other headache syndrome: Secondary | ICD-10-CM | POA: Diagnosis not present

## 2023-09-03 DIAGNOSIS — R0689 Other abnormalities of breathing: Secondary | ICD-10-CM | POA: Diagnosis not present

## 2023-09-03 DIAGNOSIS — R55 Syncope and collapse: Secondary | ICD-10-CM | POA: Diagnosis not present

## 2023-09-03 LAB — URINALYSIS, ROUTINE W REFLEX MICROSCOPIC
Bilirubin Urine: NEGATIVE
Glucose, UA: NEGATIVE mg/dL
Ketones, ur: 5 mg/dL — AB
Leukocytes,Ua: NEGATIVE
Nitrite: NEGATIVE
Protein, ur: NEGATIVE mg/dL
Specific Gravity, Urine: 1.004 — ABNORMAL LOW (ref 1.005–1.030)
pH: 7 (ref 5.0–8.0)

## 2023-09-03 LAB — CBC
HCT: 40.1 % (ref 36.0–46.0)
Hemoglobin: 13 g/dL (ref 12.0–15.0)
MCH: 32.4 pg (ref 26.0–34.0)
MCHC: 32.4 g/dL (ref 30.0–36.0)
MCV: 100 fL (ref 80.0–100.0)
Platelets: 245 10*3/uL (ref 150–400)
RBC: 4.01 MIL/uL (ref 3.87–5.11)
RDW: 13 % (ref 11.5–15.5)
WBC: 9.1 10*3/uL (ref 4.0–10.5)
nRBC: 0 % (ref 0.0–0.2)

## 2023-09-03 LAB — COMPREHENSIVE METABOLIC PANEL WITH GFR
ALT: 10 U/L (ref 0–44)
AST: 18 U/L (ref 15–41)
Albumin: 3.6 g/dL (ref 3.5–5.0)
Alkaline Phosphatase: 85 U/L (ref 38–126)
Anion gap: 10 (ref 5–15)
BUN: 14 mg/dL (ref 8–23)
CO2: 23 mmol/L (ref 22–32)
Calcium: 8.7 mg/dL — ABNORMAL LOW (ref 8.9–10.3)
Chloride: 102 mmol/L (ref 98–111)
Creatinine, Ser: 0.84 mg/dL (ref 0.44–1.00)
GFR, Estimated: >60 mL/min (ref >60)
Glucose, Bld: 119 mg/dL — ABNORMAL HIGH (ref 70–99)
Potassium: 3.7 mmol/L (ref 3.5–5.1)
Sodium: 135 mmol/L (ref 135–145)
Total Bilirubin: 0.4 mg/dL (ref 0.0–1.2)
Total Protein: 7.1 g/dL (ref 6.5–8.1)

## 2023-09-03 LAB — CK: Total CK: 27 U/L — ABNORMAL LOW (ref 38–234)

## 2023-09-03 LAB — CBG MONITORING, ED: Glucose-Capillary: 119 mg/dL — ABNORMAL HIGH (ref 70–99)

## 2023-09-03 MED ORDER — SODIUM CHLORIDE 0.9 % IV SOLN: INTRAVENOUS | Status: DC

## 2023-09-03 MED ORDER — SODIUM CHLORIDE 0.9 % IV BOLUS
1000.0000 mL | Freq: Once | INTRAVENOUS | Status: AC
  Administered 2023-09-03: 1000 mL via INTRAVENOUS

## 2023-09-03 NOTE — Discharge Instructions (Signed)
Call your doctor to schedule a follow-up visit for next week

## 2023-09-03 NOTE — ED Provider Notes (Signed)
 [place] EMERGENCY DEPARTMENT AT [facility] Provider Note   CSN: [id number] Arrival date & time: 09/03/23  1447     Patient presents with: Weakness and Dizziness   Katherine Martin is a 78 y.o. female.   78 year old female presents with a week of weakness and dizziness.  States that she has had diarrhea since taking a new medication.  Denies any fever.  No abdominal crampiness.  Has not had any chest pain or shortness of breath.  States that she has had the symptoms before in the past.  EMS was called and patient had diffuse wheezing.  Does have a history of tobacco use.  Denies any headaches.  No treatment use prior to arrival       Prior to Admission medications   Medication Sig Start Date End Date Taking? Authorizing Provider  ACTOS 15 MG tablet Take 1 tablet by mouth every morning.  09/10/12   [provider]  aspirin  81 MG tablet Take 81 mg by mouth at bedtime.     [provider]  calcium  carbonate (TUMS - DOSED IN MG ELEMENTAL CALCIUM ) 500 MG chewable tablet Chew 1 tablet by mouth daily as needed for indigestion or heartburn.    [provider]  cholecalciferol (VITAMIN D3) 25 MCG (1000 UNIT) tablet Take 1,000 Units by mouth daily.    [provider]  PAXIL  CR 25 MG 24 hr tablet Take 50 mg by mouth every evening.  10/02/12   [provider]  REPATHA SURECLICK 140 MG/ML SOAJ  12/07/21   [provider]  SYNTHROID  112 MCG tablet Take 112 mcg by mouth daily before breakfast. 10/24/21   [provider]  valsartan (DIOVAN) 80 MG tablet Take 80 mg by mouth 2 (two) times daily.    [provider]    Allergies: Clindamycin/lincomycin, Buspar [buspirone], Crestor  [rosuvastatin  calcium ], Demerol [meperidine], Effexor [venlafaxine], Erythromycin, Levoxyl  [levothyroxine ], Other, Peach [prunus persica], Penicillins, Prednisone, Sular [nisoldipine er], Zithromax [azithromycin], and Zoloft [sertraline hcl]     Review of Systems  All other systems reviewed and are negative.   Updated Vital Signs Ht 1.549 m (5' 1)   Wt 66.7 kg   SpO2 99%   BMI 27.78 kg/m   Physical Exam Vitals and nursing note reviewed.  Constitutional:      General: She is not in acute distress.    Appearance: Normal appearance. She is well-developed. She is not toxic-appearing.  HENT:     Head: Normocephalic and atraumatic.   Eyes:     General: Lids are normal.     Conjunctiva/sclera: Conjunctivae normal.     Pupils: Pupils are equal, round, and reactive to light.   Neck:     Thyroid : No thyroid  mass.     Trachea: No tracheal deviation.   Cardiovascular:     Rate and Rhythm: Normal rate and regular rhythm.     Heart sounds: Normal heart sounds. No murmur heard.    No gallop.  Pulmonary:     Effort: Pulmonary effort is normal. No respiratory distress.     Breath sounds: Normal breath sounds. No stridor. No decreased breath sounds, wheezing, rhonchi or rales.  Abdominal:     General: There is no distension.     Palpations: Abdomen is soft.     Tenderness: There is no abdominal tenderness. There is no rebound.   Musculoskeletal:        General: No tenderness. Normal range of motion.     Cervical back: Normal range  of motion and neck supple.   Skin:    General: Skin is warm and dry.     Findings: No abrasion or rash.   Neurological:     Mental Status: She is alert and oriented to person, place, and time. Mental status is at baseline.     GCS: GCS eye subscore is 4. GCS verbal subscore is 5. GCS motor subscore is 6.     Cranial Nerves: No cranial nerve deficit.     Sensory: No sensory deficit.     Motor: Motor function is intact.   Psychiatric:        Attention and Perception: Attention normal.        Speech: Speech normal.        Behavior: Behavior normal.     (all labs ordered are listed, but only abnormal results are displayed) Labs Reviewed  COMPREHENSIVE METABOLIC PANEL WITH GFR  CBC   URINALYSIS, ROUTINE W REFLEX MICROSCOPIC  CBG MONITORING, ED    EKG: None  Radiology: No results found.   Procedures   Medications Ordered in the ED - No data to display                                  Medical Decision Making Amount and/or Complexity of Data Reviewed Labs: ordered.  Risk Prescription drug management.   Given IV fluids.  Labs are reassuring here.  CK was normal.  Do not think that she has any wasting issues.  Suspect that some of this is medication tolerance.  Will discharge home and she will follow-up with her doctor.  Daughter at bedside informed of plan and she is agreeable to     Final diagnoses:  None    ED Discharge Orders     None          Lind Repine, MD 09/03/23 1729

## 2023-09-03 NOTE — ED Triage Notes (Addendum)
 Patient from home by EMS with c/o weakness and dizziness x2 weeks. PMH of HTN, diabetes, artery disease, they reported wheezing in all 4 quads.

## 2023-09-21 DIAGNOSIS — I2510 Atherosclerotic heart disease of native coronary artery without angina pectoris: Secondary | ICD-10-CM | POA: Diagnosis not present

## 2023-09-21 DIAGNOSIS — E785 Hyperlipidemia, unspecified: Secondary | ICD-10-CM | POA: Diagnosis not present

## 2023-09-21 DIAGNOSIS — Z8673 Personal history of transient ischemic attack (TIA), and cerebral infarction without residual deficits: Secondary | ICD-10-CM | POA: Diagnosis not present

## 2023-09-21 DIAGNOSIS — I739 Peripheral vascular disease, unspecified: Secondary | ICD-10-CM | POA: Diagnosis not present

## 2023-10-11 DIAGNOSIS — F4310 Post-traumatic stress disorder, unspecified: Secondary | ICD-10-CM | POA: Diagnosis not present

## 2023-10-11 DIAGNOSIS — F331 Major depressive disorder, recurrent, moderate: Secondary | ICD-10-CM | POA: Diagnosis not present

## 2023-10-19 DIAGNOSIS — D485 Neoplasm of uncertain behavior of skin: Secondary | ICD-10-CM | POA: Diagnosis not present

## 2023-10-19 DIAGNOSIS — L728 Other follicular cysts of the skin and subcutaneous tissue: Secondary | ICD-10-CM | POA: Diagnosis not present

## 2023-10-19 DIAGNOSIS — L308 Other specified dermatitis: Secondary | ICD-10-CM | POA: Diagnosis not present

## 2023-11-03 DIAGNOSIS — E785 Hyperlipidemia, unspecified: Secondary | ICD-10-CM | POA: Diagnosis not present

## 2023-11-03 DIAGNOSIS — M810 Age-related osteoporosis without current pathological fracture: Secondary | ICD-10-CM | POA: Diagnosis not present

## 2023-11-03 DIAGNOSIS — E118 Type 2 diabetes mellitus with unspecified complications: Secondary | ICD-10-CM | POA: Diagnosis not present

## 2023-11-03 DIAGNOSIS — E039 Hypothyroidism, unspecified: Secondary | ICD-10-CM | POA: Diagnosis not present

## 2023-11-03 DIAGNOSIS — I10 Essential (primary) hypertension: Secondary | ICD-10-CM | POA: Diagnosis not present

## 2023-11-16 DIAGNOSIS — D485 Neoplasm of uncertain behavior of skin: Secondary | ICD-10-CM | POA: Diagnosis not present

## 2023-11-16 DIAGNOSIS — C4371 Malignant melanoma of right lower limb, including hip: Secondary | ICD-10-CM | POA: Diagnosis not present

## 2023-11-22 ENCOUNTER — Other Ambulatory Visit: Payer: Self-pay

## 2023-11-22 ENCOUNTER — Encounter (HOSPITAL_COMMUNITY): Payer: Self-pay | Admitting: *Deleted

## 2023-11-22 ENCOUNTER — Observation Stay (HOSPITAL_COMMUNITY)
Admission: EM | Admit: 2023-11-22 | Discharge: 2023-11-29 | Disposition: A | Discharge status: Skilled Nursing Facility | Attending: Family Medicine | Admitting: Family Medicine

## 2023-11-22 ENCOUNTER — Emergency Department (HOSPITAL_COMMUNITY)

## 2023-11-22 DIAGNOSIS — E875 Hyperkalemia: Secondary | ICD-10-CM | POA: Insufficient documentation

## 2023-11-22 DIAGNOSIS — I739 Peripheral vascular disease, unspecified: Secondary | ICD-10-CM | POA: Diagnosis present

## 2023-11-22 DIAGNOSIS — E039 Hypothyroidism, unspecified: Secondary | ICD-10-CM | POA: Insufficient documentation

## 2023-11-22 DIAGNOSIS — S72002A Fracture of unspecified part of neck of left femur, initial encounter for closed fracture: Secondary | ICD-10-CM | POA: Diagnosis not present

## 2023-11-22 DIAGNOSIS — R262 Difficulty in walking, not elsewhere classified: Secondary | ICD-10-CM | POA: Diagnosis not present

## 2023-11-22 DIAGNOSIS — M25559 Pain in unspecified hip: Secondary | ICD-10-CM | POA: Diagnosis not present

## 2023-11-22 DIAGNOSIS — E119 Type 2 diabetes mellitus without complications: Secondary | ICD-10-CM | POA: Diagnosis not present

## 2023-11-22 DIAGNOSIS — W19XXXA Unspecified fall, initial encounter: Secondary | ICD-10-CM | POA: Diagnosis not present

## 2023-11-22 DIAGNOSIS — E871 Hypo-osmolality and hyponatremia: Secondary | ICD-10-CM | POA: Diagnosis present

## 2023-11-22 DIAGNOSIS — S72113A Displaced fracture of greater trochanter of unspecified femur, initial encounter for closed fracture: Secondary | ICD-10-CM | POA: Diagnosis present

## 2023-11-22 DIAGNOSIS — S72115A Nondisplaced fracture of greater trochanter of left femur, initial encounter for closed fracture: Secondary | ICD-10-CM | POA: Diagnosis not present

## 2023-11-22 DIAGNOSIS — Z8781 Personal history of (healed) traumatic fracture: Principal | ICD-10-CM

## 2023-11-22 DIAGNOSIS — M85852 Other specified disorders of bone density and structure, left thigh: Secondary | ICD-10-CM | POA: Diagnosis not present

## 2023-11-22 DIAGNOSIS — D649 Anemia, unspecified: Secondary | ICD-10-CM | POA: Diagnosis present

## 2023-11-22 DIAGNOSIS — I2510 Atherosclerotic heart disease of native coronary artery without angina pectoris: Secondary | ICD-10-CM | POA: Diagnosis present

## 2023-11-22 DIAGNOSIS — M8580 Other specified disorders of bone density and structure, unspecified site: Secondary | ICD-10-CM | POA: Insufficient documentation

## 2023-11-22 DIAGNOSIS — I10 Essential (primary) hypertension: Secondary | ICD-10-CM | POA: Diagnosis present

## 2023-11-22 DIAGNOSIS — M6281 Muscle weakness (generalized): Secondary | ICD-10-CM | POA: Insufficient documentation

## 2023-11-22 DIAGNOSIS — E785 Hyperlipidemia, unspecified: Secondary | ICD-10-CM | POA: Diagnosis present

## 2023-11-22 DIAGNOSIS — W01198A Fall on same level from slipping, tripping and stumbling with subsequent striking against other object, initial encounter: Secondary | ICD-10-CM | POA: Insufficient documentation

## 2023-11-22 DIAGNOSIS — M25552 Pain in left hip: Secondary | ICD-10-CM | POA: Diagnosis not present

## 2023-11-22 DIAGNOSIS — M1612 Unilateral primary osteoarthritis, left hip: Secondary | ICD-10-CM | POA: Diagnosis not present

## 2023-11-22 DIAGNOSIS — S72116A Nondisplaced fracture of greater trochanter of unspecified femur, initial encounter for closed fracture: Principal | ICD-10-CM | POA: Insufficient documentation

## 2023-11-22 DIAGNOSIS — F32A Depression, unspecified: Secondary | ICD-10-CM | POA: Diagnosis present

## 2023-11-22 DIAGNOSIS — S72112A Displaced fracture of greater trochanter of left femur, initial encounter for closed fracture: Secondary | ICD-10-CM | POA: Diagnosis present

## 2023-11-22 MED ORDER — OXYCODONE HCL 5 MG PO TABS
5.0000 mg | ORAL_TABLET | Freq: Once | ORAL | Status: AC
  Administered 2023-11-22: 5 mg via ORAL
  Filled 2023-11-22: qty 1

## 2023-11-22 NOTE — ED Provider Notes (Signed)
 [place] EMERGENCY DEPARTMENT AT [facility] Provider Note   CSN: [id number] Arrival date & time: 11/22/23  2252     History Chief Complaint  Patient presents with   Hip Pain    HPI Elianah Karis is a 78 y.o. female presenting for chief complaint of left hip pain. Last week she was knocked over by the foot of her recliner Tonight she has sudden onset severe pain while trying to stand.  Now unable to ambulate.  Patient's recorded medical, surgical, social, medication list and allergies were reviewed in the Snapshot window as part of the initial history.   Review of Systems   Review of Systems  Constitutional:  Negative for chills and fever.  HENT:  Negative for ear pain and sore throat.   Eyes:  Negative for pain and visual disturbance.  Respiratory:  Negative for cough and shortness of breath.   Cardiovascular:  Negative for chest pain and palpitations.  Gastrointestinal:  Negative for abdominal pain and vomiting.  Genitourinary:  Negative for dysuria and hematuria.  Musculoskeletal:  Negative for arthralgias and back pain.  Skin:  Negative for color change and rash.  Neurological:  Negative for seizures and syncope.  All other systems reviewed and are negative.   Physical Exam Updated Vital Signs BP (!) 204/68 (BP Location: Right Arm)   Pulse 75   Temp 98.5 F (36.9 C) (Oral)   Resp 20   SpO2 100%  Physical Exam Vitals and nursing note reviewed.  Constitutional:      General: She is not in acute distress.    Appearance: She is well-developed.  HENT:     Head: Normocephalic and atraumatic.  Eyes:     Conjunctiva/sclera: Conjunctivae normal.  Cardiovascular:     Rate and Rhythm: Normal rate and regular rhythm.     Heart sounds: No murmur heard. Pulmonary:     Effort: Pulmonary effort is normal. No respiratory distress.     Breath sounds: Normal breath sounds.  Abdominal:     General: There is no distension.     Palpations: Abdomen is soft.      Tenderness: There is no abdominal tenderness. There is no right CVA tenderness or left CVA tenderness.  Musculoskeletal:        General: Tenderness present. No swelling. Normal range of motion.     Cervical back: Neck supple.  Skin:    General: Skin is warm and dry.  Neurological:     General: No focal deficit present.     Mental Status: She is alert and oriented to person, place, and time. Mental status is at baseline.     Cranial Nerves: No cranial nerve deficit.      ED Course/ Medical Decision Making/ A&P    Procedures Procedures   Medications Ordered in ED Medications - No data to display  Medical Decision Making:   Lavonia Eager is a 78 y.o. female who presented to the ED today with *** detailed above.    {crccomplexity:27900} Complete initial physical exam performed, notably the patient  was ***.    Reviewed and confirmed nursing documentation for past medical history, family history, social history.    Initial Assessment:   With the patient's presentation of ***, most likely diagnosis is ***. Other diagnoses were considered including (but not limited to) ***. These are considered less likely due to history of present illness and physical exam findings.   {crccopa:27899}  Initial Plan:  ***  ***Screening labs including CBC and Metabolic  panel to evaluate for infectious or metabolic etiology of disease.  ***Urinalysis with reflex culture ordered to evaluate for UTI or relevant urologic/nephrologic pathology.  ***CXR to evaluate for structural/infectious intrathoracic pathology.  {crccardiactesting:32591::EKG to evaluate for cardiac pathology} Objective evaluation as below reviewed   Initial Study Results:   Laboratory  All laboratory results reviewed without evidence of clinically relevant pathology.   ***Exceptions include: ***   ***EKG EKG was reviewed independently. Rate, rhythm, axis, intervals all examined and without medically relevant abnormality. ST  segments without concerns for elevations.    Radiology:  All images reviewed independently. ***Agree with radiology report at this time.   No results found.    Consults: Case discussed with ***.   Reassessment and Plan:   ***    ***  Clinical Impression: No diagnosis found.   Data Unavailable   Final Clinical Impression(s) / ED Diagnoses Final diagnoses:  None    Rx / DC Orders ED Discharge Orders     None

## 2023-11-22 NOTE — ED Provider Notes (Incomplete)
  [place] EMERGENCY DEPARTMENT AT [facility] Provider Note   CSN: [id number] Arrival date & time: 11/22/23  2252     History Chief Complaint  Patient presents with  . Hip Pain    HPI Jennaya Pogue is a 78 y.o. female presenting for ***.   Patient's recorded medical, surgical, social, medication list and allergies were reviewed in the Snapshot window as part of the initial history.   Review of Systems   Review of Systems  Physical Exam Updated Vital Signs Pulse 75   Temp 98.5 F (36.9 C) (Oral)   SpO2 100%  Physical Exam   ED Course/ Medical Decision Making/ A&P    Procedures Procedures   Medications Ordered in ED Medications - No data to display  Medical Decision Making:   Lashane Whelpley is a 78 y.o. female who presented to the ED today with *** detailed above.    {crccomplexity:27900} Complete initial physical exam performed, notably the patient  was ***.    Reviewed and confirmed nursing documentation for past medical history, family history, social history.    Initial Assessment:   With the patient's presentation of ***, most likely diagnosis is ***. Other diagnoses were considered including (but not limited to) ***. These are considered less likely due to history of present illness and physical exam findings.   {crccopa:27899}  Initial Plan:  ***  ***Screening labs including CBC and Metabolic panel to evaluate for infectious or metabolic etiology of disease.  ***Urinalysis with reflex culture ordered to evaluate for UTI or relevant urologic/nephrologic pathology.  ***CXR to evaluate for structural/infectious intrathoracic pathology.  {crccardiactesting:32591::EKG to evaluate for cardiac pathology} Objective evaluation as below reviewed   Initial Study Results:   Laboratory  All laboratory results reviewed without evidence of clinically relevant pathology.   ***Exceptions include: ***   ***EKG EKG was reviewed independently. Rate,  rhythm, axis, intervals all examined and without medically relevant abnormality. ST segments without concerns for elevations.    Radiology:  All images reviewed independently. ***Agree with radiology report at this time.   No results found.    Consults: Case discussed with ***.   Reassessment and Plan:   ***    ***  Clinical Impression: No diagnosis found.   Data Unavailable   Final Clinical Impression(s) / ED Diagnoses Final diagnoses:  None    Rx / DC Orders ED Discharge Orders     None

## 2023-11-22 NOTE — ED Triage Notes (Signed)
 Pt says last week, she went to close the recliner, and it threw her back and she landed on her right hip. She says that it was hurting, but getting better. Today, had difficulty walking. Says she leaned up in the chair tonight and heard something pop

## 2023-11-22 NOTE — ED Triage Notes (Signed)
 Pt arrives from home via [facility]. Per their report, the patient fell 1 week ago. Stood up out of the chair tonight heard a pop, difficulty ambulating, 10/10 pain in the left hip. Obvious shortening. A/O x 4. PMS intact. Vitals 134/72, 93% RA, 84 HR.

## 2023-11-23 ENCOUNTER — Emergency Department (HOSPITAL_COMMUNITY)

## 2023-11-23 ENCOUNTER — Encounter (HOSPITAL_COMMUNITY): Payer: Self-pay | Admitting: Radiology

## 2023-11-23 DIAGNOSIS — M85852 Other specified disorders of bone density and structure, left thigh: Secondary | ICD-10-CM | POA: Diagnosis not present

## 2023-11-23 DIAGNOSIS — E871 Hypo-osmolality and hyponatremia: Secondary | ICD-10-CM | POA: Diagnosis present

## 2023-11-23 DIAGNOSIS — S72115A Nondisplaced fracture of greater trochanter of left femur, initial encounter for closed fracture: Secondary | ICD-10-CM

## 2023-11-23 DIAGNOSIS — D649 Anemia, unspecified: Secondary | ICD-10-CM | POA: Diagnosis present

## 2023-11-23 DIAGNOSIS — S72113A Displaced fracture of greater trochanter of unspecified femur, initial encounter for closed fracture: Secondary | ICD-10-CM | POA: Diagnosis present

## 2023-11-23 DIAGNOSIS — S72112A Displaced fracture of greater trochanter of left femur, initial encounter for closed fracture: Secondary | ICD-10-CM | POA: Diagnosis present

## 2023-11-23 DIAGNOSIS — M1612 Unilateral primary osteoarthritis, left hip: Secondary | ICD-10-CM | POA: Diagnosis not present

## 2023-11-23 LAB — CBC WITH DIFFERENTIAL/PLATELET
Abs Immature Granulocytes: 0.04 K/uL (ref 0.00–0.07)
Basophils Absolute: 0 K/uL (ref 0.0–0.1)
Basophils Relative: 0 %
Eosinophils Absolute: 0.1 K/uL (ref 0.0–0.5)
Eosinophils Relative: 1 %
HCT: 34.4 % — ABNORMAL LOW (ref 36.0–46.0)
Hemoglobin: 11.4 g/dL — ABNORMAL LOW (ref 12.0–15.0)
Immature Granulocytes: 0 %
Lymphocytes Relative: 15 %
Lymphs Abs: 1.3 K/uL (ref 0.7–4.0)
MCH: 31.3 pg (ref 26.0–34.0)
MCHC: 33.1 g/dL (ref 30.0–36.0)
MCV: 94.5 fL (ref 80.0–100.0)
Monocytes Absolute: 0.7 K/uL (ref 0.1–1.0)
Monocytes Relative: 8 %
Neutro Abs: 6.9 K/uL (ref 1.7–7.7)
Neutrophils Relative %: 76 %
Platelets: 301 K/uL (ref 150–400)
RBC: 3.64 MIL/uL — ABNORMAL LOW (ref 3.87–5.11)
RDW: 13 % (ref 11.5–15.5)
WBC: 9.1 K/uL (ref 4.0–10.5)
nRBC: 0 % (ref 0.0–0.2)

## 2023-11-23 LAB — BASIC METABOLIC PANEL WITH GFR
Anion gap: 12 (ref 5–15)
BUN: 13 mg/dL (ref 8–23)
CO2: 24 mmol/L (ref 22–32)
Calcium: 9.9 mg/dL (ref 8.9–10.3)
Chloride: 97 mmol/L — ABNORMAL LOW (ref 98–111)
Creatinine, Ser: 0.77 mg/dL (ref 0.44–1.00)
GFR, Estimated: >60 mL/min (ref >60)
Glucose, Bld: 114 mg/dL — ABNORMAL HIGH (ref 70–99)
Potassium: 4.1 mmol/L (ref 3.5–5.1)
Sodium: 132 mmol/L — ABNORMAL LOW (ref 135–145)

## 2023-11-23 LAB — GLUCOSE, CAPILLARY
Glucose-Capillary: 112 mg/dL — ABNORMAL HIGH (ref 70–99)
Glucose-Capillary: 98 mg/dL (ref 70–99)
Glucose-Capillary: 109 mg/dL — ABNORMAL HIGH (ref 70–99)

## 2023-11-23 LAB — HEMOGLOBIN A1C
Hgb A1c MFr Bld: 5.9 % — ABNORMAL HIGH (ref 4.8–5.6)
Mean Plasma Glucose: 122.63 mg/dL

## 2023-11-23 MED ORDER — ACETAMINOPHEN 650 MG RE SUPP: 650.0000 mg | Freq: Four times a day (QID) | RECTAL | Status: DC | PRN

## 2023-11-23 MED ORDER — VITAMIN D 25 MCG (1000 UNIT) PO TABS
1000.0000 [IU] | ORAL_TABLET | Freq: Every day | ORAL | Status: DC
  Administered 2023-11-23 – 2023-11-29 (×7): 1000 [IU] via ORAL
  Filled 2023-11-23 (×7): qty 1

## 2023-11-23 MED ORDER — LEVOTHYROXINE SODIUM 100 MCG PO TABS
100.0000 ug | ORAL_TABLET | Freq: Every day | ORAL | Status: DC
  Administered 2023-11-23 – 2023-11-29 (×7): 100 ug via ORAL
  Filled 2023-11-23 (×7): qty 1

## 2023-11-23 MED ORDER — PIOGLITAZONE HCL 15 MG PO TABS
15.0000 mg | ORAL_TABLET | Freq: Every morning | ORAL | Status: DC
  Administered 2023-11-23 – 2023-11-29 (×7): 15 mg via ORAL
  Filled 2023-11-23 (×7): qty 1

## 2023-11-23 MED ORDER — INSULIN ASPART 100 UNIT/ML IJ SOLN
0.0000 [IU] | Freq: Three times a day (TID) | INTRAMUSCULAR | Status: DC
  Administered 2023-11-28: 1 [IU] via SUBCUTANEOUS

## 2023-11-23 MED ORDER — ACETAMINOPHEN 325 MG PO TABS
650.0000 mg | ORAL_TABLET | Freq: Four times a day (QID) | ORAL | Status: DC | PRN
  Administered 2023-11-23 – 2023-11-29 (×8): 650 mg via ORAL
  Filled 2023-11-23 (×8): qty 2

## 2023-11-23 MED ORDER — HYDRALAZINE HCL 25 MG PO TABS
25.0000 mg | ORAL_TABLET | Freq: Four times a day (QID) | ORAL | Status: DC | PRN
Start: 2023-11-23 — End: 2023-11-23

## 2023-11-23 MED ORDER — ORAL CARE MOUTH RINSE
15.0000 mL | OROMUCOSAL | Status: DC | PRN
Start: 2023-11-23 — End: 2023-11-29

## 2023-11-23 MED ORDER — KETOROLAC TROMETHAMINE 15 MG/ML IJ SOLN: 15.0000 mg | Freq: Four times a day (QID) | INTRAMUSCULAR | Status: AC | PRN

## 2023-11-23 MED ORDER — IRBESARTAN 75 MG PO TABS
75.0000 mg | ORAL_TABLET | Freq: Two times a day (BID) | ORAL | Status: DC
  Administered 2023-11-23 – 2023-11-29 (×13): 75 mg via ORAL
  Filled 2023-11-23 (×13): qty 1

## 2023-11-23 MED ORDER — ONDANSETRON HCL 4 MG/2ML IJ SOLN: 4.0000 mg | Freq: Four times a day (QID) | INTRAMUSCULAR | Status: DC | PRN

## 2023-11-23 MED ORDER — HYDRALAZINE HCL 25 MG PO TABS
25.0000 mg | ORAL_TABLET | Freq: Four times a day (QID) | ORAL | Status: DC | PRN
  Administered 2023-11-23 – 2023-11-27 (×8): 25 mg via ORAL
  Filled 2023-11-23 (×8): qty 1

## 2023-11-23 MED ORDER — OXYCODONE HCL 5 MG PO TABS
5.0000 mg | ORAL_TABLET | ORAL | Status: DC | PRN
  Administered 2023-11-24: 2.5 mg via ORAL
  Administered 2023-11-25 – 2023-11-28 (×5): 5 mg via ORAL
  Filled 2023-11-23 (×8): qty 1

## 2023-11-23 MED ORDER — CALCIUM CARBONATE ANTACID 500 MG PO CHEW: 1.0000 | CHEWABLE_TABLET | Freq: Every day | ORAL | Status: DC | PRN

## 2023-11-23 MED ORDER — ONDANSETRON HCL 4 MG PO TABS: 4.0000 mg | ORAL_TABLET | Freq: Four times a day (QID) | ORAL | Status: DC | PRN

## 2023-11-23 MED ORDER — ASPIRIN 81 MG PO TBEC
81.0000 mg | DELAYED_RELEASE_TABLET | Freq: Every day | ORAL | Status: DC
  Administered 2023-11-23 – 2023-11-28 (×6): 81 mg via ORAL
  Filled 2023-11-23 (×6): qty 1

## 2023-11-23 MED ORDER — PAROXETINE HCL ER 12.5 MG PO TB24
50.0000 mg | ORAL_TABLET | Freq: Every evening | ORAL | Status: DC
  Administered 2023-11-23 – 2023-11-28 (×6): 50 mg via ORAL
  Filled 2023-11-23 (×8): qty 4

## 2023-11-23 NOTE — H&P (Signed)
 History and Physical    Patient: Katherine Martin FMW:[id number] DOB: 21-Jun-1945 DOA: 11/22/2023 DOS: the patient was seen and examined on 11/23/2023 PCP: Clarice Nottingham, MD  Patient coming from: Home  Chief Complaint:  Chief Complaint  Patient presents with   Hip Pain   HPI: Katherine Martin is a 78 y.o. 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  She has been having pain since then, but yesterday evening she stood up out of her recliner, heard the pop in her left hip area developing increased pain and difficulty ambulating after this. He denied fever, chills, rhinorrhea, sore throat, wheezing or hemoptysis.  No chest pain, palpitations, diaphoresis, PND, orthopnea or pitting edema of the lower extremities.  No abdominal pain, nausea, emesis, diarrhea, constipation, melena or hematochezia.  No flank pain, dysuria, frequency or hematuria.  No polyuria, polydipsia, polyphagia or blurred vision.   Lab work: CBC showed a white count of 9.1, hemoglobin 11.4 g/dL and platelets 698.  BMP shows sodium 132, potassium 4.1, chloride 97 and CO2 24 mmol/L.  Glucose 114 mg/dL.  Normal renal function and calcium  level.  Imaging: Left hip x-ray showing questionable transverse lucency projecting over the greater trochanter, indeterminate for fracture versus overlying artifact.  CT recommended for further assessment.  CT of the left hip show an acute nondisplaced transverse fracture through the posterior superior facet of the left greater trochanter.  No further fractures are seen.  There is mild osteopenia and degenerative change.  Iliofemoral atherosclerosis.  Mild subcutaneous stranding in the lateral left hip region likely posttraumatic.  No hematoma.    ED course: Initial vital signs were temperature 98.5 F, pulse 75, respirations 20, BP 204/68 mmHg O2 sat 100% on room air.  The patient received oxycodone  5 mg p.o. x 1 dose.  Review of Systems: As mentioned in the history of present illness. All other systems reviewed and are negative. Past Medical History:  Diagnosis Date   Anxiety    Complication of anesthesia 1990s   had trouble waking me up   Coronary artery disease, occlusive    cath 2007  - RCA 100%, Severe distal Circumflex   Daily headache    last couple weeks (07/09/2015)   Depression    Dyslipidemia    Hypertension    Hypothyroidism    Obesity (BMI 30.0-34.9)    PONV (postoperative nausea and vomiting)    PVD (peripheral vascular disease) (HCC)    LAA Dopplers, November 2013: ABIs--0.67 right, 0.65 left bilateral common iliacs less than 50% stenosis, bilateral SFA demonstrate exclusive disease with reconstitution in the proximal segment of bronchial arteries.) He artery occluded with 2 vessel runoff on the right, 3 vessel run off on left.   Tobacco use    Type 2 diabetes mellitus (HCC)    Past Surgical History:  Procedure Laterality Date   CARDIAC CATHETERIZATION  03/06/2006   100% RCA, Severe distal Circumflex stenosis, moderate LAD; EF ~60% with mild anterior hypokinesis. (Dr. DOROTHA Gent)   CHOLECYSTECTOMY OPEN     DILATION AND CURETTAGE OF UTERUS     Lower Extremity Arterial Doppler  01/2012   bilateral ABI demonstrate moderate arterial insuff at rest; abd aorta with non-hemodynamically significant plaque; bilat CIA with equal/less than 50% diameter reduction; bilat SFA with occlusive disease & reconstiution  in prox popliteal; R peroneal not visualized   Lower Extremity Arterial Doppler  September 2014   RABI: 0.55; LABI 0.53; moderate CEA, bilateral SFA-occlusive with reconstitution and proximal Popliteal; bilateral Peroneal artery occlusion.   NM MY[facility]TD - Treadmill  02/2009   Walk 6 minutes. No ischemia  or infarction.   TONSILLECTOMY     Social History:  reports that she has been smoking cigarettes. She has a 50 pack-year smoking history. She has never used smokeless tobacco. She reports that she does not drink alcohol and does not use drugs.  Allergies  Allergen Reactions   Clindamycin/Lincomycin Anaphylaxis   Buspar [Buspirone]    Crestor  [Rosuvastatin  Calcium ] Other (See Comments)    Extreme weakness and vomiting    Demerol [Meperidine] Hives   Effexor [Venlafaxine]    Erythromycin Hives   Levoxyl  [Levothyroxine ]    Other Hives    peaches   Peach [Prunus Persica] Hives   Penicillins Hives    Did it involve swelling of the face/tongue/throat, SOB, or low BP? No Did it involve sudden or severe rash/hives, skin peeling, or any reaction on the inside of your mouth or nose? Yes Did you need to seek medical attention at a hospital or doctor's office? Yes When did it last happen?      a long time ago If all above answers are NO, may proceed with cephalosporin use.    Prednisone Hives   Sular [Nisoldipine Er]    Zithromax [Azithromycin]    Zoloft [Sertraline Hcl]     Family History  Problem Relation Age of Onset   Heart attack Father 21   Heart failure Paternal Grandmother    Heart attack Paternal Grandfather    Heart attack Brother        CABG   Heart attack Sister     Prior to Admission medications   Medication Sig Start Date End Date Taking? Authorizing Provider  ACTOS  15 MG tablet Take 1 tablet by mouth every morning.  09/10/12   [provider]  aspirin  81 MG tablet Take 81 mg by mouth at bedtime.     [provider]  calcium  carbonate (TUMS - DOSED IN MG ELEMENTAL CALCIUM ) 500 MG chewable tablet Chew 1 tablet by mouth daily as needed for indigestion or heartburn.    [provider]  cholecalciferol  (VITAMIN D3) 25 MCG (1000 UNIT) tablet Take 1,000 Units by mouth daily.    [provider]  PAXIL  CR 25 MG 24 hr tablet Take 50 mg by  mouth every evening.  10/02/12   [provider]  REPATHA SURECLICK 140 MG/ML SOAJ  12/07/21   [provider]  SYNTHROID  112 MCG tablet Take 112 mcg by mouth daily before breakfast. 10/24/21   [provider]  valsartan (DIOVAN) 80 MG tablet Take 80 mg by mouth 2 (two) times daily.    [provider]    Physical Exam: Vitals:   11/23/23 0515 11/23/23 0600 11/23/23 0611 11/23/23 0745  BP: (!) 157/55 (!) 191/59  (!) 195/67  Pulse: 70 69  65  Resp: 18 18  14   Temp:   97.7 F (36.5 C) (!) 97.5 F (36.4 C)  TempSrc:    Oral  SpO2: 97% 98%  100%   Physical Exam Vitals and nursing note reviewed.  Constitutional:      General: She is awake. She is not in acute distress.    Appearance: She is ill-appearing.  HENT:     Head: Normocephalic.  Nose: No rhinorrhea.     Mouth/Throat:     Mouth: Mucous membranes are moist.  Eyes:     General: No scleral icterus.    Pupils: Pupils are equal, round, and reactive to light.  Neck:     Vascular: No JVD.  Cardiovascular:     Rate and Rhythm: Normal rate and regular rhythm.     Heart sounds: S1 normal and S2 normal.  Pulmonary:     Effort: Pulmonary effort is normal.     Breath sounds: Normal breath sounds. No wheezing, rhonchi or rales.  Abdominal:     General: Bowel sounds are normal. There is no distension.     Palpations: Abdomen is soft.     Tenderness: There is no abdominal tenderness. There is no right CVA tenderness or left CVA tenderness.  Musculoskeletal:     Cervical back: Neck supple.     Left hip: Tenderness present. Decreased range of motion.     Right lower leg: No edema.     Left lower leg: No edema.  Skin:    General: Skin is warm and dry.  Neurological:     General: No focal deficit present.     Mental Status: She is alert and oriented to person, place, and time.  Psychiatric:        Mood and Affect: Mood normal.        Behavior: Behavior normal. Behavior is cooperative.      Data Reviewed:  Results are pending, will review when available.  Assessment and Plan: Principal Problem:   Greater trochanter fracture (HCC) Observation/MedSurg. Continue IV fluids. Analgesics as needed. Antiemetics as needed. Follow CBC, CMP in AM. Orthopedic surgery to reevaluate.  Active Problems:   Hyponatremia Minimal. Unknown clinical significance. Will follow sodium level in AM.    Normocytic anemia Monitor hematocrit and hemoglobin. Follow-up with PCP as an outpatient.    CAD (coronary artery disease)   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 Continue aspirin  81 mg p.o. daily. Might benefit from statin therapy.    Dyslipidemia Follow-up with primary care provider.    Hypertension Continue valsartan 80 mg p.o. twice daily or formulary equivalent.    Type 2 diabetes mellitus (HCC) Carbohydrate modified diet. Continue pioglitazone  15 mg p.o. daily. CBG monitoring with RI SS. Check hemoglobin A1c.    Depression Continue paroxetine  CR 50 mg p.o. daily.      Advance Care Planning:   Code Status: Full Code   Consults:   Family Communication:   Severity of Illness: The appropriate patient status for this patient is OBSERVATION. Observation status is judged to be reasonable and necessary in order to provide the required intensity of service to ensure the patient's safety. The patient's presenting symptoms, physical exam findings, and initial radiographic and laboratory data in the context of their medical condition is felt to place them at decreased risk for further clinical deterioration. Furthermore, it is anticipated that the patient will be medically stable for discharge from the hospital within 2 midnights of admission.   Author: Alm Dorn Castor, MD 11/23/2023 8:12 AM  For on call review [email or web address].   This document was prepared using Dragon voice recognition software and may contain some unintended transcription errors.'

## 2023-11-24 DIAGNOSIS — S72112A Displaced fracture of greater trochanter of left femur, initial encounter for closed fracture: Secondary | ICD-10-CM | POA: Diagnosis not present

## 2023-11-24 DIAGNOSIS — Z8781 Personal history of (healed) traumatic fracture: Secondary | ICD-10-CM | POA: Diagnosis not present

## 2023-11-24 LAB — COMPREHENSIVE METABOLIC PANEL WITH GFR
ALT: 7 U/L (ref 0–44)
AST: 14 U/L — ABNORMAL LOW (ref 15–41)
Albumin: 3.7 g/dL (ref 3.5–5.0)
Alkaline Phosphatase: 97 U/L (ref 38–126)
Anion gap: 12 (ref 5–15)
BUN: 13 mg/dL (ref 8–23)
CO2: 24 mmol/L (ref 22–32)
Calcium: 9.7 mg/dL (ref 8.9–10.3)
Chloride: 99 mmol/L (ref 98–111)
Creatinine, Ser: 0.8 mg/dL (ref 0.44–1.00)
GFR, Estimated: >60 mL/min (ref >60)
Glucose, Bld: 93 mg/dL (ref 70–99)
Potassium: 4.7 mmol/L (ref 3.5–5.1)
Sodium: 135 mmol/L (ref 135–145)
Total Bilirubin: 0.3 mg/dL (ref 0.0–1.2)
Total Protein: 7 g/dL (ref 6.5–8.1)

## 2023-11-24 LAB — GLUCOSE, CAPILLARY
Glucose-Capillary: 87 mg/dL (ref 70–99)
Glucose-Capillary: 111 mg/dL — ABNORMAL HIGH (ref 70–99)
Glucose-Capillary: 108 mg/dL — ABNORMAL HIGH (ref 70–99)
Glucose-Capillary: 104 mg/dL — ABNORMAL HIGH (ref 70–99)

## 2023-11-24 LAB — CBC
HCT: 37.8 % (ref 36.0–46.0)
Hemoglobin: 12.5 g/dL (ref 12.0–15.0)
MCH: 31.6 pg (ref 26.0–34.0)
MCHC: 33.1 g/dL (ref 30.0–36.0)
MCV: 95.5 fL (ref 80.0–100.0)
Platelets: 314 K/uL (ref 150–400)
RBC: 3.96 MIL/uL (ref 3.87–5.11)
RDW: 13 % (ref 11.5–15.5)
WBC: 8.9 K/uL (ref 4.0–10.5)
nRBC: 0 % (ref 0.0–0.2)

## 2023-11-24 NOTE — Progress Notes (Signed)
 OT Cancellation Note  Patient Details Name: Trudie Cervantes MRN: [id number] DOB: Dec 21, 1945   Cancelled Treatment:    Reason Eval/Treat Not Completed: Medical issues which prohibited therapy. Per chart, pt pending further orthopedic consult. OT will hold eval at this time and check back as appropriate.   Liborio Saccente L. Henrry Feil, OTR/L  11/24/23, 11:20 AM

## 2023-11-24 NOTE — Plan of Care (Signed)
   Problem: Coping: Goal: Level of anxiety will decrease Outcome: Progressing   Problem: Pain Managment: Goal: General experience of comfort will improve and/or be controlled Outcome: Progressing   Problem: Safety: Goal: Ability to remain free from injury will improve Outcome: Progressing

## 2023-11-24 NOTE — Care Management Obs Status (Signed)
 MEDICARE OBSERVATION STATUS NOTIFICATION   Patient Details  Name: Katherine Martin MRN: [id number] Date of Birth: May 21, 1945   Medicare Observation Status Notification Given:  Yes    NORMAN ASPEN, LCSW 11/24/2023, 10:56 AM

## 2023-11-24 NOTE — Evaluation (Signed)
 Physical Therapy Evaluation Patient Details Name: Katherine Martin MRN: [id number] DOB: 01-15-1946 Today's Date: 11/24/2023  History of Present Illness  78 y.o. female who had a fall about a week ago injuring her left hip, then 11/22/23 felt a pop and had pain in L hip with sit to stand. Dx of greater trochanter fx, nonoperative tx plan. 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  Clinical Impression  Pt admitted with above diagnosis. Pt ambulates without an assistive device and is independent with ADLs (including driving) at baseline. Today she required mod assist for supine to sit, min assist sit to stand and to pivot to a recliner with a RW, pain limited activity tolerance. Initiated HEP. Patient will benefit from continued inpatient follow up therapy, <3 hours/day. Pt currently with functional limitations due to the deficits listed below (see PT Problem List). Pt will benefit from acute skilled PT to increase their independence and safety with mobility to allow discharge.           If plan is discharge home, recommend the following: A little help with walking and/or transfers;A little help with bathing/dressing/bathroom;Assistance with cooking/housework;Assist for transportation;Help with stairs or ramp for entrance   Can travel by private vehicle   Yes    Equipment Recommendations Rolling walker (2 wheels);BSC/3in1  Recommendations for Other Services       Functional Status Assessment Patient has had a recent decline in their functional status and demonstrates the ability to make significant improvements in function in a reasonable and predictable amount of time.     Precautions / Restrictions Precautions Precautions:  Fall Recall of Precautions/Restrictions: Intact Precaution/Restrictions Comments: Limit hip abduction based on symptoms per ortho Restrictions Weight Bearing Restrictions Per Provider Order: No Other Position/Activity Restrictions: WBAT      Mobility  Bed Mobility Overal bed mobility: Needs Assistance Bed Mobility: Supine to Sit     Supine to sit: Mod assist     General bed mobility comments: assist to raise trunk and pivot hips to EOB, gait belt used as leg lifter    Transfers Overall transfer level: Needs assistance Equipment used: Rolling walker (2 wheels) Transfers: Sit to/from Stand, Bed to chair/wheelchair/BSC Sit to Stand: Min assist, From elevated surface           General transfer comment: VCs hand placement, min A to power up    Ambulation/Gait               General Gait Details: deferred 2* pain  Stairs            Wheelchair Mobility     Tilt Bed    Modified Rankin (Stroke Patients Only)       Balance Overall balance assessment: Needs assistance   Sitting balance-Leahy Scale: Good     Standing balance support: Bilateral upper extremity supported, During functional activity, Reliant on assistive device for balance Standing balance-Leahy Scale: Poor                               Pertinent Vitals/Pain Pain Assessment Pain Assessment: 0-10 Pain Score: 8  Pain Location: L hip Pain Descriptors / Indicators: Sore, Guarding, Grimacing Pain Intervention(s): Limited activity within patient's tolerance, Monitored during session, Patient requesting pain  meds-RN notified, Repositioned (pt refused ice)    Home Living Family/patient expects to be discharged to:: Private residence Living Arrangements: Alone Available Help at Discharge: Family;Available PRN/intermittently Type of Home: Apartment Home Access: Level entry       Home Layout: One level Home Equipment: None Additional Comments: daughter is in same apt  complex    Prior Function Prior Level of Function : Independent/Modified Independent;Driving             Mobility Comments: 1 fall ~1 week ago, no other falls in past 6 months; walks without AD ADLs Comments: independent     Extremity/Trunk Assessment   Upper Extremity Assessment Upper Extremity Assessment: Overall WFL for tasks assessed    Lower Extremity Assessment Lower Extremity Assessment: LLE deficits/detail LLE Deficits / Details: knee ext -3/5, hip flexion AAROM ~30* limited by pain LLE: Unable to fully assess due to pain LLE Sensation: WNL LLE Coordination: WNL    Cervical / Trunk Assessment Cervical / Trunk Assessment: Normal  Communication   Communication Communication: No apparent difficulties    Cognition Arousal: Alert Behavior During Therapy: WFL for tasks assessed/performed   PT - Cognitive impairments: No apparent impairments                         Following commands: Intact       Cueing Cueing Techniques: Verbal cues, Tactile cues     General Comments      Exercises  Ankle pumps x10 both AROM L heel slides x 5 AAROM supine Short arc quads x 10 AAROM supine Left   Assessment/Plan    PT Assessment Patient needs continued PT services  PT Problem List Decreased strength;Decreased range of motion;Decreased activity tolerance;Decreased balance;Decreased mobility;Pain;Decreased knowledge of use of DME       PT Treatment Interventions Gait training;Therapeutic exercise;Functional mobility training;Therapeutic activities;DME instruction    PT Goals (Current goals can be found in the Care Plan section)  Acute Rehab PT Goals Patient Stated Goal: to get strong enough to go home PT Goal Formulation: With patient/family Time For Goal Achievement: 12/08/23 Potential to Achieve Goals: Good    Frequency Min 3X/week     Co-evaluation               AM-PAC PT 6 Clicks Mobility  Outcome Measure Help needed turning from your  back to your side while in a flat bed without using bedrails?: A Little Help needed moving from lying on your back to sitting on the side of a flat bed without using bedrails?: A Lot Help needed moving to and from a bed to a chair (including a wheelchair)?: A Little Help needed standing up from a chair using your arms (e.g., wheelchair or bedside chair)?: A Lot Help needed to walk in hospital room?: A Lot Help needed climbing 3-5 steps with a railing? : Total 6 Click Score: 13    End of Session Equipment Utilized During Treatment: Gait belt Activity Tolerance: Patient tolerated treatment well Patient left: in chair;with chair alarm set;with call bell/phone within reach;with nursing/sitter in room;with family/visitor present Nurse Communication: Mobility status PT Visit Diagnosis: Difficulty in walking, not elsewhere classified (R26.2);Pain;Muscle weakness (generalized) (M62.81);History of falling (Z91.81) Pain - Right/Left: Left Pain - part of body: Hip    Time: 8491-8469 PT Time Calculation (min) (ACUTE ONLY): 22 min   Charges:   PT Evaluation $PT Eval Moderate Complexity: 1 Mod   PT General Charges $$ ACUTE PT VISIT: 1 Visit  Sylvan Nest Kistler PT 11/24/2023  Acute Rehabilitation Services  Office [phone]

## 2023-11-24 NOTE — Consult Note (Signed)
 ORTHOPAEDIC CONSULTATION  REQUESTING PHYSICIAN: Cindy Garnette POUR, MD  Chief Complaint: Left greater trochanter fracture  HPI: Katherine Martin is a 78 y.o. 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  She has been having pain since then, but yesterday evening she stood up out of her recliner, heard the pop in her left hip area developing increased pain and difficulty ambulating after this.  Pain worse with weight bearing and better with rest.  She denied fever, chills, rhinorrhea, sore throat, wheezing or hemoptysis.  No chest pain, palpitations, diaphoresis, PND, orthopnea or pitting edema of the lower extremities.  No abdominal pain, nausea, emesis, diarrhea, constipation, melena or hematochezia.  No flank pain, dysuria, frequency or hematuria.  No polyuria, polydipsia, polyphagia or blurred vision  Ortho consulted for surgical evaluation.  Past Medical History:  Diagnosis Date   Anxiety    Complication of anesthesia 1990s   had trouble waking me up   Coronary artery disease, occlusive    cath 2007  - RCA 100%, Severe distal Circumflex   Daily headache    last couple weeks (07/09/2015)   Depression    Dyslipidemia    Hypertension    Hypothyroidism    Obesity (BMI 30.0-34.9)    PONV (postoperative nausea and vomiting)    PVD (peripheral vascular disease) (HCC)    LAA Dopplers, November 2013: ABIs--0.67 right, 0.65 left bilateral common iliacs less than 50% stenosis, bilateral SFA demonstrate exclusive disease with reconstitution in the proximal segment of bronchial arteries.) He artery occluded with 2 vessel runoff on the right, 3 vessel run off on left.   Tobacco use    Type 2 diabetes mellitus (HCC)    Past  Surgical History:  Procedure Laterality Date   CARDIAC CATHETERIZATION  03/06/2006   100% RCA, Severe distal Circumflex stenosis, moderate LAD; EF ~60% with mild anterior hypokinesis. (Dr. DOROTHA Gent)   CHOLECYSTECTOMY OPEN     DILATION AND CURETTAGE OF UTERUS     Lower Extremity Arterial Doppler  01/2012   bilateral ABI demonstrate moderate arterial insuff at rest; abd aorta with non-hemodynamically significant plaque; bilat CIA with equal/less than 50% diameter reduction; bilat SFA with occlusive disease & reconstiution in prox popliteal; R peroneal not visualized   Lower Extremity Arterial Doppler  September 2014   RABI: 0.55; LABI 0.53; moderate CEA, bilateral SFA-occlusive with reconstitution and proximal Popliteal; bilateral Peroneal artery occlusion.   NM [facility] - Treadmill  02/2009   Walk 6 minutes. No ischemia or infarction.   TONSILLECTOMY     Social History   Socioeconomic History   Marital status: Widowed    Spouse name: Not on file   Number of children: 1   Years of education: 50   Highest education level: Not on file  Occupational History    Employer: OTHER  Tobacco Use   Smoking status: Every Day    Current packs/day: 1.00    Average packs/day: 1 pack/day for 50.0 years (50.0 ttl pk-yrs)    Types: Cigarettes   Smokeless tobacco: Never  Vaping Use   Vaping status: Former  Substance and Sexual Activity   Alcohol use: No   Drug use: No   Sexual activity: Never  Other Topics Concern   Not on file  Social History Narrative  She is a widowed mother of one. She continues to smoke a pack a day, as she has for about 40 years. She is trying to pick up her exercise level but does not really do much with exercise. She says she is basically lazy   Social Drivers of Corporate investment banker Strain: Not on file  Food Insecurity: No Food Insecurity (11/23/2023)   Hunger Vital Sign    Worried About Running Out of Food in the Last Year: Never true    Ran Out of  Food in the Last Year: Never true  Transportation Needs: No Transportation Needs (11/23/2023)   PRAPARE - Administrator, Civil Service (Medical): No    Lack of Transportation (Non-Medical): No  Physical Activity: Not on file  Stress: Not on file  Social Connections: Socially Isolated (11/23/2023)   Social Connection and Isolation Panel    Frequency of Communication with Friends and Family: More than three times a week    Frequency of Social Gatherings with Friends and Family: More than three times a week    Attends Religious Services: Never    Database administrator or Organizations: No    Attends Banker Meetings: Never    Marital Status: Widowed   Family History  Problem Relation Age of Onset   Heart attack Father 44   Heart failure Paternal Grandmother    Heart attack Paternal Grandfather    Heart attack Brother        CABG   Heart attack Sister    Allergies  Allergen Reactions   Clindamycin/Lincomycin Anaphylaxis   Buspar [Buspirone]    Crestor  [Rosuvastatin  Calcium ] Other (See Comments)    Extreme weakness and vomiting    Demerol [Meperidine] Hives   Effexor [Venlafaxine]    Erythromycin Hives   Levoxyl  [Levothyroxine ]    Other Hives    peaches   Peach [Prunus Persica] Hives   Penicillins Hives    Did it involve swelling of the face/tongue/throat, SOB, or low BP? No Did it involve sudden or severe rash/hives, skin peeling, or any reaction on the inside of your mouth or nose? Yes Did you need to seek medical attention at a hospital or doctor's office? Yes When did it last happen?      a long time ago If all above answers are NO, may proceed with cephalosporin use.    Prednisone Hives   Sular [Nisoldipine Er]    Zithromax [Azithromycin]    Zoloft [Sertraline Hcl]    Prior to Admission medications   Medication Sig Start Date End Date Taking? Authorizing Provider  ACTOS  15 MG tablet Take 15 mg by mouth every morning. 09/10/12  Yes [provider]  aspirin  81 MG tablet Take 81 mg by mouth at bedtime.    Yes [provider]  calcium  carbonate (TUMS - DOSED IN MG ELEMENTAL CALCIUM ) 500 MG chewable tablet Chew 1 tablet by mouth daily as needed for indigestion or heartburn.   Yes [provider]  cholecalciferol  (VITAMIN D3) 25 MCG (1000 UNIT) tablet Take 1,000 Units by mouth daily.   Yes [provider]  levothyroxine  (SYNTHROID ) 100 MCG tablet Take 100 mcg by mouth daily before breakfast. 10/24/21  Yes [provider]  PAXIL  CR 25 MG 24 hr tablet Take 50 mg by mouth every evening.  10/02/12  Yes [provider]  valsartan (DIOVAN) 80 MG tablet Take 80 mg by mouth 2 (two) times daily.   Yes [provider]   CT Hip Left Wo Contrast Result Date: 11/23/2023 CLINICAL DATA:  Fracture left greater trochanter. Check further fractures. EXAM: CT OF THE LEFT HIP WITHOUT CONTRAST TECHNIQUE: Multidetector CT imaging of the left hip was performed according to the standard protocol. Multiplanar CT image reconstructions were also generated. RADIATION DOSE REDUCTION: This exam was performed according to the departmental dose-optimization program which includes automated exposure control, adjustment of the mA and/or kV according to patient size and/or use of iterative reconstruction technique. COMPARISON:  AP pelvis and left hip views from yesterday. FINDINGS: Bones/Joint/Cartilage Mild osteopenia. There is an acute nondisplaced transverse fracture through the posterosuperior facet of the greater trochanter. No further fractures are seen. No dislocation. There is mild nonerosive arthrosis at the left hip, slight spurring SI joints, symphysis pubis and trace enthesopathic change along the greater trochanter left inferior pubic ramus. There is no evidence of other focal bone abnormality, no suspicious regional bone lesion. Ligaments Suboptimally assessed by CT. Muscles and Tendons There are mild fatty  atrophic changes in the left gluteal musculature. Other muscle bulk is preserved. No acute abnormality is seen of the regional tendons. Soft tissues There are mild subcutaneous stranding changes in the lateral left hip region likely posttraumatic. There is no hematoma. Visualized portions of the bladder, uterus and adnexal structures are unremarkable for age. Within the proper pelvis there is no free fluid, free air or free hemorrhage in the visualized portion. The left internal iliac artery, the left common femoral artery, and proximal left superficial femoral artery are heavily calcified with scattered calcification in the deep femoral artery. IMPRESSION: 1. Acute nondisplaced transverse fracture through the posterosuperior facet of the left greater trochanter. 2. No further fractures are seen. 3. Mild osteopenia and degenerative change. 4. Iliofemoral atherosclerosis. 5. Mild subcutaneous stranding in the lateral left hip region likely posttraumatic. No hematoma. Electronically Signed   By: Francis Quam M.D.   On: 11/23/2023 03:38   DG Hip Unilat With Pelvis 2-3 Views Left Result Date: 11/22/2023 CLINICAL DATA:  Left hip pain, fall earlier today, felt a pop. EXAM: DG HIP (WITH OR WITHOUT PELVIS) 2-3V LEFT COMPARISON:  None Available. FINDINGS: Questionable transverse lucency projects over the greater trochanter, indeterminate for fracture versus overlying artifact. No other fracture of the hip. No hip dislocation. Pubic rami are intact. Minimal degenerative change with peripheral spurring. No pubic symphyseal or sacroiliac diastasis. Prominent vascular calcifications. IMPRESSION: Questionable transverse lucency projects over the greater trochanter, indeterminate for fracture versus overlying artifact. Recommend CT for further assessment. Electronically Signed   By: Andrea Gasman M.D.   On: 11/22/2023 23:51    All pertinent xrays, MRI, CT independently reviewed and interpreted  Positive ROS: All other  systems have been reviewed and were otherwise negative with the exception of those mentioned in the HPI and as above.  Physical Exam: General: No acute distress Cardiovascular: No pedal edema Respiratory: No cyanosis, no use of accessory musculature GI: No organomegaly, abdomen is soft and non-tender Skin: No lesions in the area of chief complaint Neurologic: Sensation intact distally Psychiatric: Patient is at baseline mood and affect Lymphatic: No axillary or cervical lymphadenopathy  MUSCULOSKELETAL:  - pain with movement of the hip and extremity - skin intact - NVI distally - compartments soft  Assessment: Left greater trochanter fracture  Plan: - CT confirms fracture limited to GT - nonop treatment - WBAT LLE, limit hip abduction based on symptoms - f/u 2 weeks outpatient for repeat imaging  Thank you for  the consult and the opportunity to see [name]. Ozell Cummins, MD [facility] 12:29 PM

## 2023-11-24 NOTE — Progress Notes (Signed)
 PT Cancellation Note  Patient Details Name: Katherine Martin MRN: [id number] DOB: [birth year]   Cancelled Treatment:     PT order received but eval deferred pending orthopedic consult.  Per note by Dr Celinda Orthopedic surgery to reevaluate.  Will follow.     Sathvik Tiedt 11/24/2023, 7:53 AM

## 2023-11-24 NOTE — Progress Notes (Signed)
  Progress Note   Patient: Katherine Martin FMW:[id number] DOB: [birth year] DOA: 11/22/2023     0 DOS: the patient was seen and examined on 11/24/2023   Brief hospital course: 78 y.o. 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  She has been having pain since then, but yesterday evening she stood up out of her recliner, heard the pop in her left hip area developing increased pain and difficulty ambulating after this. He denied fever, chills, rhinorrhea, sore throat, wheezing or hemoptysis.  No chest pain, palpitations, diaphoresis, PND, orthopnea or pitting edema of the lower extremities.  No abdominal pain, nausea, emesis, diarrhea, constipation, melena or hematochezia.  No flank pain, dysuria, frequency or hematuria.  No polyuria, polydipsia, polyphagia or blurred vision.   Assessment and Plan: Principal Problem:   Greater trochanter fracture [facility]) Orthopedic Surgery consulted. Recs for non-operative treatment with WBAT LLE and f/u in 2 weeks for repeat imaging with Ortho -PT/OT consulted, recs for SNF noted. TOC consulted   Active Problems:   Hyponatremia Normalized     Normocytic anemia Normalized     CAD (coronary artery disease)   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 Continue aspirin  81 mg p.o. daily. Might benefit from statin therapy.     Dyslipidemia Follow-up with primary care provider.     Hypertension Continue valsartan 80 mg p.o. twice daily or formulary equivalent.     Type 2 diabetes mellitus (HCC) Carbohydrate modified diet. Continue pioglitazone  15 mg p.o. daily. CBG monitoring with RI SS. A1c 5.9     Depression Continue paroxetine  CR 50 mg p.o. daily.      Subjective: Without  complaints this AM  Physical Exam: Vitals:   11/24/23 0623 11/24/23 0852 11/24/23 0935 11/24/23 1307  BP: (!) 174/66 (!) 176/59 (!) 146/74 (!) 156/49  Pulse: 72 68 64 68  Resp: 18  16 18   Temp: (!) 97.4 F (36.3 C)  97.6 F (36.4 C) 97.8 F (36.6 C)  TempSrc: Oral  Oral   SpO2: 95%  94% 97%  Weight:      Height:       General exam: Awake, laying in bed, in nad Respiratory system: Normal respiratory effort, no wheezing Cardiovascular system: regular rate, s1, s2 Gastrointestinal system: Soft, nondistended, positive BS Central nervous system: CN2-12 grossly intact, strength intact Extremities: Perfused, no clubbing Skin: Normal skin turgor, no notable skin lesions seen Psychiatry: Mood normal // no visual hallucinations   Data Reviewed:  Labs reviewed: Na 135, K 4.7, Cr 0.80, WBC 8.9  Family Communication: Pt in room, family not at bedside  Disposition: Status is: Observation The patient remains OBS appropriate and will d/c before 2 midnights.  Planned Discharge Destination: Skilled nursing facility    Author: Garnette Pelt, MD 11/24/2023 5:17 PM  For on call review [email or web address].

## 2023-11-24 NOTE — TOC Initial Note (Signed)
 Transition of Care [facility]) - Initial/Assessment Note    Patient Details  Name: Katherine Martin MRN: [id number] Date of Birth: [birth year]  Transition of Care [facility]) CM/SW Contact:    NORMAN ASPEN, LCSW Phone Number: 11/24/2023, 1:04 PM  Clinical Narrative:                  Met with pt this morning to introduce TOC/ CSW role with dc planning needs.  Pt is admittedly frustrated with hip fx/ overall situation.  Reports that she has been told by Ortho MD that no surgery will be planned.  Confirms that she does live alone and has one local daughter who can provide some support.  Currently awaiting PT/ OT evaluations to determine level of assist she may need.  Pt is hopeful she can dc home but we did, briefly, discuss possible SNF rehab.  Will continue to follow.  Expected Discharge Plan:  (TBD) Barriers to Discharge: Continued Medical Work up   Patient Goals and CMS Choice Patient states their goals for this hospitalization and ongoing recovery are:: pt hopeful to be able to return home but does live alone          Expected Discharge Plan and Services In-house Referral: Clinical Social Work     Living arrangements for the past 2 months: Apartment                                      Prior Living Arrangements/Services Living arrangements for the past 2 months: Apartment Lives with:: Self Patient language and need for interpreter reviewed:: Yes Do you feel safe going back to the place where you live?: Yes      Need for Family Participation in Patient Care: Yes (Comment) Care giver support system in place?:  (TBD)   Criminal Activity/Legal Involvement Pertinent to Current Situation/Hospitalization: No - Comment as needed  Activities of Daily Living   ADL Screening (condition at time of admission) Independently performs ADLs?: Yes (appropriate for developmental age) Is the patient deaf or have difficulty hearing?: No Does the patient have difficulty seeing, even when wearing  glasses/contacts?: No Does the patient have difficulty concentrating, remembering, or making decisions?: No  Permission Sought/Granted Permission sought to share information with : Family Supports Permission granted to share information with : Yes, Verbal Permission Granted  Share Information with NAME: daughter, [name] Raw @ [phone]           Emotional Assessment Appearance:: Appears stated age Attitude/Demeanor/Rapport: Engaged Affect (typically observed): Frustrated Orientation: : Oriented to Place, Oriented to  Time, Oriented to Situation, Oriented to Self Alcohol / Substance Use: Not Applicable Psych Involvement: No (comment)  Admission diagnosis:  Greater trochanter fracture (HCC) [S72.113A] S/p left hip fracture [Z87.81] Patient Active Problem List   Diagnosis Date Noted   Displaced fracture of greater trochanter of left femur, initial encounter for closed fracture (HCC) 11/23/2023   Hyponatremia 11/23/2023   Normocytic anemia 11/23/2023   Near syncope 07/09/2015   Headache 07/09/2015   Type 2 diabetes mellitus (HCC) 07/09/2015   Tobacco use 07/09/2015   Depression 07/09/2015   Dehydration    Stroke-like symptom    Facial droop    Bell's palsy 05/19/2014   Diabetes mellitus (HCC) 05/17/2014   Right facial numbness 05/17/2014   TIA (transient ischemic attack) 05/17/2014   Carotid bruit 10/20/2012   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 10/20/2012    Class:  Chronic   Dyslipidemia    Hypertension    Tobacco abuse counseling    Obesity (BMI 30.0-34.9)    CAD (coronary artery disease) 10/20/2005   PCP:  Clarice Nottingham, MD Pharmacy:   [facility] [phone] [address], [place] - [address] [place] [address] Phone: [phone] Fax: [phone]     Social Drivers of Health (SDOH) Social History: SDOH Screenings   Food Insecurity: No Food Insecurity (11/23/2023)  Housing: Low Risk  (11/23/2023)   Transportation Needs: No Transportation Needs (11/23/2023)  Utilities: Not At Risk (11/23/2023)  Social Connections: Socially Isolated (11/23/2023)  Tobacco Use: High Risk (11/22/2023)   SDOH Interventions:     Readmission Risk Interventions     No data to display

## 2023-11-25 DIAGNOSIS — Z8781 Personal history of (healed) traumatic fracture: Secondary | ICD-10-CM | POA: Diagnosis not present

## 2023-11-25 DIAGNOSIS — S72112A Displaced fracture of greater trochanter of left femur, initial encounter for closed fracture: Secondary | ICD-10-CM | POA: Diagnosis not present

## 2023-11-25 LAB — GLUCOSE, CAPILLARY
Glucose-Capillary: 89 mg/dL (ref 70–99)
Glucose-Capillary: 99 mg/dL (ref 70–99)
Glucose-Capillary: 94 mg/dL (ref 70–99)
Glucose-Capillary: 123 mg/dL — ABNORMAL HIGH (ref 70–99)

## 2023-11-25 NOTE — Evaluation (Signed)
 Occupational Therapy Evaluation Patient Details Name: Katherine Martin MRN: [id number] DOB: 23-Apr-1945 Today's Date: 11/25/2023   History of Present Illness   78 y.o. female who had a fall about a week ago injuring her left hip, then 11/22/23 felt a pop and had pain in L hip with sit to stand. Dx of greater trochanter fx, nonoperative tx plan. 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     Clinical Impressions PTA, pt reports she was independent with ADL/IADL and functional mobility. She reports she lives alone but her daughter lives nearby. Pt currently requires cga for bed mobility for sit<>stand and step pivot transfers. She requires minA for LB bathing and dressing and for posterior care and she requires BUE support on RW for stability. Pt will continue to benefit from skilled OT services during admission. Patient will benefit from continued inpatient follow up therapy, <3 hours/day. Will continue to follow acutely and progress appropriately.      If plan is discharge home, recommend the following:   A little help with walking and/or transfers;A little help with bathing/dressing/bathroom;Assistance with cooking/housework;Assist for transportation     Functional Status Assessment   Patient has had a recent decline in their functional status and demonstrates the ability to make significant improvements in function in a reasonable and predictable amount of time.     Equipment Recommendations   BSC/3in1     Recommendations for Other Services         Precautions/Restrictions   Precautions Precautions: Fall Recall of Precautions/Restrictions: Intact Precaution/Restrictions Comments: Limit hip abduction based on  symptoms per ortho Restrictions Weight Bearing Restrictions Per Provider Order: No Other Position/Activity Restrictions: WBAT     Mobility Bed Mobility Overal bed mobility: Needs Assistance Bed Mobility: Supine to Sit     Supine to sit: Contact guard     General bed mobility comments: cga for safety, hob elevated, pt using increased time and effort    Transfers Overall transfer level: Needs assistance Equipment used: Rolling walker (2 wheels) Transfers: Sit to/from Stand, Bed to chair/wheelchair/BSC Sit to Stand: Contact guard assist     Step pivot transfers: Contact guard assist     General transfer comment: cga for safety      Balance Overall balance assessment: Needs assistance   Sitting balance-Leahy Scale: Good     Standing balance support: Bilateral upper extremity supported, During functional activity, Reliant on assistive device for balance Standing balance-Leahy Scale: Poor Standing balance comment: min A with more dynamic balance activity, cga for safety and balance with BUE support                           ADL either performed or assessed with clinical judgement   ADL Overall ADL's : Needs assistance/impaired Eating/Feeding: Set up   Grooming: Set up;Sitting   Upper Body Bathing: Set up;Sitting Upper Body Bathing Details (indicate cue type and reason): completed while seated in recliner Lower Body Bathing: Minimal assistance;Sit to/from stand Lower Body Bathing Details (indicate cue type and reason): assist to access posterior aspect of legs Upper Body Dressing : Set up;Sitting   Lower Body Dressing: Minimal assistance;Sit to/from stand Lower Body Dressing Details (indicate cue type and reason): assist to access bilateral feet Toilet Transfer: Minimal  assistance;Stand-pivot Statistician Details (indicate cue type and reason): assist for safety and stability, cues for safe hand placement Toileting- Clothing Manipulation and Hygiene:  Minimal assistance;Sit to/from stand Toileting - Clothing Manipulation Details (indicate cue type and reason): assist for posterior care     Functional mobility during ADLs: Minimal assistance;Rolling walker (2 wheels) General ADL Comments: minA for longer duration and more dynamic balance tasks, with BUE support pt requiring cga for stability     Vision Baseline Vision/History: 1 Wears glasses Ability to See in Adequate Light: 1 Impaired Patient Visual Report: No change from baseline       Perception         Praxis         Pertinent Vitals/Pain Pain Assessment Pain Assessment: 0-10 Pain Score: 4  Pain Location: L hip Pain Descriptors / Indicators: Sore, Guarding, Grimacing Pain Intervention(s): Limited activity within patient's tolerance, Monitored during session     Extremity/Trunk Assessment Upper Extremity Assessment Upper Extremity Assessment: Overall WFL for tasks assessed   Lower Extremity Assessment Lower Extremity Assessment: Defer to PT evaluation LLE Deficits / Details: limited knee extension/hip flexion with repositioning  due to pain LLE Sensation: WNL LLE Coordination: WNL   Cervical / Trunk Assessment Cervical / Trunk Assessment: Normal   Communication Communication Communication: No apparent difficulties   Cognition Arousal: Alert Behavior During Therapy: WFL for tasks assessed/performed Cognition: No apparent impairments                               Following commands: Intact       Cueing  General Comments   Cueing Techniques: Verbal cues;Tactile cues      Exercises General Exercises - Lower Extremity Ankle Circles/Pumps: AROM, Both, 10 reps, Supine Short Arc Quad: AAROM, Left, 10 reps, Supine Heel Slides: AAROM, Left, 5 reps, Supine   Shoulder Instructions      Home Living Family/patient expects to be discharged to:: Private residence Living Arrangements: Alone Available Help at Discharge: Family;Available  PRN/intermittently Type of Home: Apartment Home Access: Level entry     Home Layout: One level     Bathroom Shower/Tub: Tub/shower unit         Home Equipment: None   Additional Comments: daughter is in same apt complex      Prior Functioning/Environment Prior Level of Function : Independent/Modified Independent;Driving             Mobility Comments: 1 fall ~1 week ago, no other falls in past 6 months; walks without AD ADLs Comments: independent    OT Problem List: Pain;Decreased activity tolerance;Decreased range of motion;Decreased strength;Impaired balance (sitting and/or standing);Decreased safety awareness   OT Treatment/Interventions: Self-care/ADL training;Therapeutic exercise;Energy conservation;DME and/or AE instruction;Balance training;Patient/family education      OT Goals(Current goals can be found in the care plan section)   Acute Rehab OT Goals Patient Stated Goal: to get stronger and go home OT Goal Formulation: With patient Time For Goal Achievement: 12/09/23 Potential to Achieve Goals: Good ADL Goals Pt Will Perform Grooming: with modified independence;standing Pt Will Perform Lower Body Dressing: with modified independence;sit to/from stand Pt Will Transfer to Toilet: with modified independence;ambulating Additional ADL Goal #1: Pt will demonstrate independence with 3 fall prevention strategies.   OT Frequency:  Min 2X/week    Co-evaluation              AM-PAC OT 6 Clicks Daily Activity     Outcome Measure Help  from another person eating meals?: A Little Help from another person taking care of personal grooming?: A Little Help from another person toileting, which includes using toliet, bedpan, or urinal?: A Little Help from another person bathing (including washing, rinsing, drying)?: A Little Help from another person to put on and taking off regular upper body clothing?: A Little Help from another person to put on and taking off  regular lower body clothing?: A Little 6 Click Score: 18   End of Session Equipment Utilized During Treatment: Rolling walker (2 wheels) Nurse Communication: Mobility status  Activity Tolerance: Patient tolerated treatment well Patient left: in chair;with call bell/phone within reach;with chair alarm set  OT Visit Diagnosis: Other abnormalities of gait and mobility (R26.89);Muscle weakness (generalized) (M62.81)                Time: 9041-8979 OT Time Calculation (min): 22 min Charges:  OT General Charges $OT Visit: 1 Visit OT Evaluation $OT Eval Low Complexity: 1 Low  Taralee Marcus OTR/L Acute Rehabilitation Services Office: [phone]   Verneita ONEIDA Moose 11/25/2023, 11:52 AM

## 2023-11-25 NOTE — Progress Notes (Signed)
 Physical Therapy Treatment Patient Details Name: Katherine Martin MRN: [id number] DOB: 10/01/1945 Today's Date: 11/25/2023   History of Present Illness 78 y.o. female who had a fall about a week ago injuring her left hip, then 11/22/23 felt a pop and had pain in L hip with sit to stand. Dx of greater trochanter fx, nonoperative tx plan. 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    PT Comments  Improved activity tolerance today. Pt ambulated 20' with RW, no loss of balance, distance limited by pain and fatigue. Pt performed LLE strengthening exercises and tolerated them well.     If plan is discharge home, recommend the following: A little help with walking and/or transfers;A little help with bathing/dressing/bathroom;Assistance with cooking/housework;Assist for transportation;Help with stairs or ramp for entrance   Can travel by private vehicle     Yes  Equipment Recommendations  Rolling walker (2 wheels);BSC/3in1    Recommendations for Other Services       Precautions / Restrictions Precautions Precautions: Fall Recall of Precautions/Restrictions: Intact Precaution/Restrictions Comments: Limit hip abduction based on symptoms per ortho Restrictions Weight Bearing Restrictions Per Provider Order: No Other Position/Activity Restrictions: WBAT     Mobility  Bed Mobility               General bed mobility comments: up in recliner    Transfers Overall transfer level: Needs assistance Equipment used: Rolling walker (2 wheels) Transfers: Sit to/from Stand Sit to Stand: Contact guard assist           General transfer comment: VCs for hand placement    Ambulation/Gait Ambulation/Gait assistance: Contact  guard assist Gait Distance (Feet): 20 Feet Assistive device: Rolling walker (2 wheels) Gait Pattern/deviations: Step-to pattern, Decreased step length - left, Decreased step length - right Gait velocity: decr     General Gait Details: steady, no loss of balance, distance limited by fatigue and pain   Stairs             Wheelchair Mobility     Tilt Bed    Modified Rankin (Stroke Patients Only)       Balance Overall balance assessment: Needs assistance   Sitting balance-Leahy Scale: Good     Standing balance support: Bilateral upper extremity supported, During functional activity, Reliant on assistive device for balance Standing balance-Leahy Scale: Poor                              Communication Communication Communication: No apparent difficulties  Cognition Arousal: Alert Behavior During Therapy: WFL for tasks assessed/performed                             Following commands: Intact      Cueing Cueing Techniques: Verbal cues, Tactile cues  Exercises General Exercises - Lower Extremity Ankle Circles/Pumps: AROM, Both, 10 reps, Supine Quad Sets: AROM, Left, 5 reps, Supine Short Arc Quad: Left, 10 reps, Supine, AROM Heel Slides: AAROM, Left, Supine, 10 reps    General Comments        Pertinent Vitals/Pain Pain Assessment Pain Score: 6  Pain Location: L hip Pain Descriptors / Indicators: Sore, Guarding, Grimacing Pain Intervention(s): Limited activity within patient's tolerance, Monitored  during session, Premedicated before session, Repositioned, Ice applied    Home Living Family/patient expects to be discharged to:: Private residence Living Arrangements: Alone Available Help at Discharge: Family;Available PRN/intermittently Type of Home: Apartment Home Access: Level entry       Home Layout: One level Home Equipment: None Additional Comments: daughter is in same apt complex    Prior Function            PT Goals  (current goals can now be found in the care plan section) Acute Rehab PT Goals Patient Stated Goal: to get strong enough to go home PT Goal Formulation: With patient/family Time For Goal Achievement: 12/08/23 Potential to Achieve Goals: Good Progress towards PT goals: Progressing toward goals    Frequency    Min 3X/week      PT Plan      Co-evaluation              AM-PAC PT 6 Clicks Mobility   Outcome Measure  Help needed turning from your back to your side while in a flat bed without using bedrails?: A Little Help needed moving from lying on your back to sitting on the side of a flat bed without using bedrails?: A Little Help needed moving to and from a bed to a chair (including a wheelchair)?: A Little Help needed standing up from a chair using your arms (e.g., wheelchair or bedside chair)?: A Little Help needed to walk in hospital room?: A Little Help needed climbing 3-5 steps with a railing? : A Lot 6 Click Score: 17    End of Session Equipment Utilized During Treatment: Gait belt Activity Tolerance: Patient tolerated treatment well;Patient limited by pain Patient left: in chair;with call bell/phone within reach;with chair alarm set Nurse Communication: Mobility status PT Visit Diagnosis: Difficulty in walking, not elsewhere classified (R26.2);Pain;Muscle weakness (generalized) (M62.81);History of falling (Z91.81) Pain - Right/Left: Left Pain - part of body: Hip     Time: 1116-1130 PT Time Calculation (min) (ACUTE ONLY): 14 min  Charges:    $Gait Training: 8-22 mins PT General Charges $$ ACUTE PT VISIT: 1 Visit                     Sylvan Nest Kistler PT 11/25/2023  Acute Rehabilitation Services  Office [phone]

## 2023-11-25 NOTE — Progress Notes (Signed)
  Progress Note   Patient: Katherine Martin FMW:[id number] DOB: [birth year] DOA: 11/22/2023     0 DOS: the patient was seen and examined on 11/25/2023   Brief hospital course: 78 y.o. 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  She has been having pain since then, but yesterday evening she stood up out of her recliner, heard the pop in her left hip area developing increased pain and difficulty ambulating after this. He denied fever, chills, rhinorrhea, sore throat, wheezing or hemoptysis.  No chest pain, palpitations, diaphoresis, PND, orthopnea or pitting edema of the lower extremities.  No abdominal pain, nausea, emesis, diarrhea, constipation, melena or hematochezia.  No flank pain, dysuria, frequency or hematuria.  No polyuria, polydipsia, polyphagia or blurred vision.   Assessment and Plan: Principal Problem:   Greater trochanter fracture [facility]) Orthopedic Surgery consulted. Recs for non-operative treatment with WBAT LLE and f/u in 2 weeks for repeat imaging with Ortho -PT/OT consulted, recs for SNF. 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 daily. Might benefit from statin therapy.     Dyslipidemia Follow-up with primary care provider.     Hypertension Continue valsartan 80 mg p.o. twice daily or formulary equivalent.     Type 2 diabetes mellitus (HCC) Carbohydrate modified diet. Continue pioglitazone  15 mg p.o. daily. CBG monitoring with RI SS. A1c 5.9     Depression Continue paroxetine  CR 50 mg p.o. daily.      Subjective: No complaints  this AM  Physical Exam: Vitals:   11/25/23 0452 11/25/23 0512 11/25/23 1337 11/25/23 1352  BP: (!) 166/60 (!) 166/60 (!) 166/71 (!) 166/71  Pulse: 77  73   Resp: 18  18   Temp: 98.2 F (36.8 C)  97.8 F (36.6 C)   TempSrc: Oral  Oral   SpO2: 97%  99%   Weight:      Height:       General exam: Conversant, in no acute distress Respiratory system: normal chest rise, clear, no audible wheezing Cardiovascular system: regular rhythm, s1-s2 Gastrointestinal system: Nondistended, nontender, pos BS Central nervous system: No seizures, no tremors Extremities: No cyanosis, no joint deformities Skin: No rashes, no pallor Psychiatry: Affect normal // no auditory hallucinations   Data Reviewed:  There are no new results to review at this time.  Family Communication: Pt in room, family not at bedside  Disposition: Status is: Observation The patient remains OBS appropriate and will d/c before 2 midnights.  Planned Discharge Destination: Skilled nursing facility    Author: Garnette Pelt, MD 11/25/2023 3:38 PM  For on call review [email or web address].

## 2023-11-26 DIAGNOSIS — S72112A Displaced fracture of greater trochanter of left femur, initial encounter for closed fracture: Secondary | ICD-10-CM | POA: Diagnosis not present

## 2023-11-26 DIAGNOSIS — Z8781 Personal history of (healed) traumatic fracture: Secondary | ICD-10-CM | POA: Diagnosis not present

## 2023-11-26 LAB — GLUCOSE, CAPILLARY
Glucose-Capillary: 112 mg/dL — ABNORMAL HIGH (ref 70–99)
Glucose-Capillary: 86 mg/dL (ref 70–99)
Glucose-Capillary: 121 mg/dL — ABNORMAL HIGH (ref 70–99)
Glucose-Capillary: 116 mg/dL — ABNORMAL HIGH (ref 70–99)

## 2023-11-26 MED ORDER — POLYETHYLENE GLYCOL 3350 17 G PO PACK
17.0000 g | PACK | Freq: Every day | ORAL | Status: DC | PRN
  Administered 2023-11-26 – 2023-11-27 (×2): 17 g via ORAL
  Filled 2023-11-26 (×2): qty 1

## 2023-11-26 NOTE — Plan of Care (Signed)
   Problem: Health Behavior/Discharge Planning: Goal: Ability to manage health-related needs will improve Outcome: Progressing   Problem: Clinical Measurements: Goal: Ability to maintain clinical measurements within normal limits will improve Outcome: Progressing Goal: Will remain free from infection Outcome: Progressing

## 2023-11-26 NOTE — Progress Notes (Signed)
 Physical Therapy Treatment Patient Details Name: Gianelle Mccaul MRN: [id number] DOB: 1945/07/20 Today's Date: 11/26/2023   History of Present Illness 78 y.o. female who had a fall about a week ago injuring her left hip, then 11/22/23 felt a pop and had pain in L hip with sit to stand. Dx of greater trochanter fx, nonoperative tx plan. 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    PT Comments  Pt agreeable to working with therapy. May be having some short term memory issues-stated I haven't been up since I've been here. Pt participated well. Reports pain as fairly well controlled (increased with activity ). Patient will benefit from continued inpatient follow up therapy, <3 hours/day     If plan is discharge home, recommend the following: A little help with walking and/or transfers;A little help with bathing/dressing/bathroom;Assistance with cooking/housework;Assist for transportation;Help with stairs or ramp for entrance   Can travel by private vehicle        Equipment Recommendations  Rolling walker (2 wheels);BSC/3in1 (youth height walker)    Recommendations for Other Services       Precautions / Restrictions Precautions Precautions: Fall Precaution/Restrictions Comments: Limit hip abduction based on symptoms per ortho Restrictions Weight Bearing Restrictions Per Provider Order: No Other Position/Activity Restrictions: WBAT     Mobility  Bed Mobility Overal bed mobility: Needs Assistance Bed Mobility: Supine to Sit     Supine to sit: Min assist     General bed mobility comments: Assisted LEs for safety, adherence to precautions. Increased time.    Transfers Overall transfer level: Needs  assistance Equipment used: Rolling walker (2 wheels) Transfers: Sit to/from Stand Sit to Stand: Min assist           General transfer comment: Cues for safety. Assist to steady while rising.    Ambulation/Gait Ambulation/Gait assistance: Min assist Gait Distance (Feet): 22 Feet Assistive device: Rolling walker (2 wheels) Gait Pattern/deviations: Step-to pattern       General Gait Details: Assist to steady intermittently. Slow gait speed. Distance limited by fatigue and pain. Followed with recliner and used it to transport pt back to bedside   Stairs             Wheelchair Mobility     Tilt Bed    Modified Rankin (Stroke Patients Only)       Balance Overall balance assessment: Needs assistance, History of Falls         Standing balance support: Bilateral upper extremity supported, During functional activity, Reliant on assistive device for balance Standing balance-Leahy Scale: Poor                              Communication Communication Communication: No apparent difficulties  Cognition Arousal: Alert Behavior During Therapy: WFL for tasks assessed/performed   PT - Cognitive impairments: Memory                       PT - Cognition Comments: pt stated  I haven't been up since I've been here. Will I have to be here for a long time? Following commands: Intact      Cueing Cueing Techniques: Verbal cues  Exercises Total Joint Exercises Ankle Circles/Pumps: AROM, Both, 10 reps Quad  Sets: AROM, Both, 10 reps Heel Slides: AROM, Both, 10 reps    General Comments        Pertinent Vitals/Pain Pain Assessment Pain Assessment: Faces Faces Pain Scale: Hurts little more Pain Location: L hip Pain Descriptors / Indicators: Sore, Guarding, Grimacing Pain Intervention(s): Limited activity within patient's tolerance, Monitored during session, Repositioned    Home Living                          Prior Function             PT Goals (current goals can now be found in the care plan section) Progress towards PT goals: Progressing toward goals    Frequency    Min 3X/week      PT Plan      Co-evaluation              AM-PAC PT 6 Clicks Mobility   Outcome Measure  Help needed turning from your back to your side while in a flat bed without using bedrails?: A Little Help needed moving from lying on your back to sitting on the side of a flat bed without using bedrails?: A Little Help needed moving to and from a bed to a chair (including a wheelchair)?: A Little Help needed standing up from a chair using your arms (e.g., wheelchair or bedside chair)?: A Little Help needed to walk in hospital room?: A Little Help needed climbing 3-5 steps with a railing? : A Little 6 Click Score: 18    End of Session Equipment Utilized During Treatment: Gait belt Activity Tolerance: Patient tolerated treatment well;Patient limited by pain Patient left: in chair;with call bell/phone within reach;with chair alarm set   PT Visit Diagnosis: Difficulty in walking, not elsewhere classified (R26.2);Pain;Muscle weakness (generalized) (M62.81);History of falling (Z91.81) Pain - Right/Left: Left Pain - part of body: Hip     Time: 1220-1228 PT Time Calculation (min) (ACUTE ONLY): 8 min  Charges:    $Gait Training: 8-22 mins PT General Charges $$ ACUTE PT VISIT: 1 Visit                        Dannial SQUIBB, PT Acute Rehabilitation  Office: [phone]

## 2023-11-26 NOTE — Progress Notes (Signed)
  Progress Note   Patient: Katherine Martin FMW:[id number] DOB: [birth year] DOA: 11/22/2023     0 DOS: the patient was seen and examined on 11/26/2023   Brief hospital course: 78 y.o. 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  She has been having pain since then, but yesterday evening she stood up out of her recliner, heard the pop in her left hip area developing increased pain and difficulty ambulating after this. He denied fever, chills, rhinorrhea, sore throat, wheezing or hemoptysis.  No chest pain, palpitations, diaphoresis, PND, orthopnea or pitting edema of the lower extremities.  No abdominal pain, nausea, emesis, diarrhea, constipation, melena or hematochezia.  No flank pain, dysuria, frequency or hematuria.  No polyuria, polydipsia, polyphagia or blurred vision.   Assessment and Plan: Principal Problem:   Greater trochanter fracture [facility]) Orthopedic Surgery consulted. Recs for non-operative treatment with WBAT LLE and f/u in 2 weeks for repeat imaging with Ortho -PT/OT consulted, recs for SNF. TOC following for placement    Active Problems:   Hyponatremia Normalized     Normocytic anemia Normalized     CAD (coronary artery disease)   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 Continue aspirin  81 mg p.o. daily. Might benefit from statin therapy.     Dyslipidemia Follow-up with primary care provider.     Hypertension Continue valsartan 80 mg p.o. twice daily or formulary equivalent.     Type 2 diabetes mellitus (HCC) Carbohydrate modified diet. Continue pioglitazone  15 mg p.o. daily. CBG monitoring with RI SS. A1c 5.9     Depression Continue paroxetine  CR 50 mg p.o. daily.      Subjective:  Without complaints  Physical Exam: Vitals:   11/25/23 2059 11/26/23 0555 11/26/23 0902 11/26/23 1449  BP: (!) 155/49 (!) 182/56 (!) 145/48 (!) 148/55  Pulse: 65 70 71 71  Resp: 16 16 18 15   Temp: 98.3 F (36.8 C) 97.9 F (36.6 C) 98 F (36.7 C) 97.9 F (36.6 C)  TempSrc: Oral Oral Oral   SpO2: 93% 93% 95% 98%  Weight:      Height:       General exam: Awake, laying in bed, in nad Respiratory system: Normal respiratory effort, no wheezing Cardiovascular system: regular rate, s1, s2 Gastrointestinal system: Soft, nondistended, positive BS Central nervous system: CN2-12 grossly intact, strength intact Extremities: Perfused, no clubbing Skin: Normal skin turgor, no notable skin lesions seen Psychiatry: Mood normal // no visual hallucinations   Data Reviewed:  There are no new results to review at this time.  Family Communication: Pt in room, family not at bedside  Disposition: Status is: Observation The patient remains OBS appropriate and will d/c before 2 midnights.  Planned Discharge Destination: Skilled nursing facility    Author: Garnette Pelt, MD 11/26/2023 4:49 PM  For on call review [email or web address].

## 2023-11-27 DIAGNOSIS — Z8781 Personal history of (healed) traumatic fracture: Secondary | ICD-10-CM | POA: Diagnosis not present

## 2023-11-27 DIAGNOSIS — S72112A Displaced fracture of greater trochanter of left femur, initial encounter for closed fracture: Secondary | ICD-10-CM | POA: Diagnosis not present

## 2023-11-27 LAB — GLUCOSE, CAPILLARY
Glucose-Capillary: 91 mg/dL (ref 70–99)
Glucose-Capillary: 99 mg/dL (ref 70–99)
Glucose-Capillary: 89 mg/dL (ref 70–99)
Glucose-Capillary: 111 mg/dL — ABNORMAL HIGH (ref 70–99)

## 2023-11-27 NOTE — NC FL2 (Signed)
 [place]  MEDICAID FL2 LEVEL OF CARE FORM     IDENTIFICATION  Patient Name: Katherine Martin Birthdate: [birth year] Sex: female Admission Date (Current Location): 11/22/2023  [facility] and [place] Number:  Producer, television/film/video and Address:  [facility],  [address], [place] [address]      Provider Number: [id number]  Attending Physician Name and Address:  Cindy Garnette POUR, MD  Relative Name and Phone Number:  Donnel Krabbe (Daughter)  [phone]    Current Level of Care: Hospital Recommended Level of Care: Skilled Nursing Facility Prior Approval Number:    Date Approved/Denied:   PASRR Number: [id number]  Discharge Plan: SNF    Current Diagnoses: Patient Active Problem List   Diagnosis Date Noted   Displaced fracture of greater trochanter of left femur, initial encounter for closed fracture (HCC) 11/23/2023   Hyponatremia 11/23/2023   Normocytic anemia 11/23/2023   Near syncope 07/09/2015   Headache 07/09/2015   Type 2 diabetes mellitus (HCC) 07/09/2015   Tobacco use 07/09/2015   Depression 07/09/2015   Dehydration    Stroke-like symptom    Facial droop    Bell's palsy 05/19/2014   Diabetes mellitus (HCC) 05/17/2014   Right facial numbness 05/17/2014   TIA (transient ischemic attack) 05/17/2014   Carotid bruit 10/20/2012   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 10/20/2012   Dyslipidemia    Hypertension    Tobacco abuse counseling    Obesity (BMI 30.0-34.9)    CAD (coronary artery disease) 10/20/2005    Orientation RESPIRATION BLADDER Height & Weight     Self, Time, Situation, Place  Normal Incontinent Weight: 145 lb (65.8 kg) Height:  5' 1 (154.9 cm)  BEHAVIORAL SYMPTOMS/MOOD NEUROLOGICAL BOWEL NUTRITION STATUS      Continent Diet (Heart Healthy/Carb Modified)  AMBULATORY STATUS COMMUNICATION OF NEEDS Skin   Limited Assist Verbally Other (Comment) (Wound 11/23/23 0745 Other (Comment) Thigh Anterior;Right)                        Personal Care Assistance Level of Assistance  Bathing, Feeding, Dressing Bathing Assistance: Limited assistance Feeding assistance: Independent Dressing Assistance: Limited assistance     Functional Limitations Info  Sight, Hearing, Speech Sight Info: Impaired (eyeglasses) Hearing Info: Adequate Speech Info: Adequate    SPECIAL CARE FACTORS FREQUENCY  PT (By licensed PT), OT (By licensed OT)     PT Frequency: 5x per week OT Frequency: 5x per week            Contractures Contractures Info: Not present    Additional Factors Info  Code Status, Allergies Code Status Info: FULL Allergies Info: Clindamycin/lincomycin, Buspar (Buspirone), Crestor  (Rosuvastatin  Calcium ), Demerol (Meperidine), Effexor (Venlafaxine), Erythromycin, Levoxyl  (Levothyroxine ), Other, Peach (Prunus Persica), Penicillins, Prednisone, Sular (Nisoldipine Er), Zithromax (Azithromycin), Zoloft (Sertraline Hcl)           Current Medications (11/27/2023):  This is the current hospital active medication list Current Facility-Administered Medications  Medication Dose Route Frequency Provider Last Rate Last Admin   acetaminophen  (TYLENOL ) tablet 650 mg  650 mg Oral Q6H PRN Celinda Alm Lot, MD   650 mg at 11/26/23 2107   Or   acetaminophen  (TYLENOL ) suppository 650 mg  650 mg Rectal Q6H PRN Celinda Alm Lot, MD       aspirin  EC tablet 81 mg  81 mg Oral QHS Celinda Alm Lot, MD   81 mg at 11/26/23 2106   calcium  carbonate (TUMS - dosed in mg elemental calcium ) chewable  tablet 200 mg of elemental calcium   1 tablet Oral Daily PRN Celinda Alm Lot, MD       cholecalciferol  (VITAMIN D3) 25 MCG (1000 UNIT) tablet 1,000 Units  1,000 Units Oral Daily Celinda Alm Lot, MD   1,000 Units at 11/27/23 0830   hydrALAZINE  (APRESOLINE ) tablet 25 mg  25 mg Oral Q6H PRN Celinda Alm Lot, MD   25 mg at 11/27/23 9451   insulin  aspart (novoLOG ) injection 0-9 Units  0-9 Units Subcutaneous TID WC Celinda Alm Lot, MD       irbesartan  (AVAPRO ) tablet 75 mg  75 mg Oral BID Celinda Alm Lot, MD   75 mg at 11/27/23 0830   levothyroxine  (SYNTHROID ) tablet 100 mcg  100 mcg Oral QAC breakfast Celinda Alm Lot, MD   100 mcg at 11/27/23 0547   ondansetron  (ZOFRAN ) tablet 4 mg  4 mg Oral Q6H PRN Celinda Alm Lot, MD       Or   ondansetron  (ZOFRAN ) injection 4 mg  4 mg Intravenous Q6H PRN Celinda Alm Lot, MD       Oral care mouth rinse  15 mL Mouth Rinse PRN Celinda Alm Lot, MD       oxyCODONE  (Oxy IR/ROXICODONE ) immediate release tablet 5 mg  5 mg Oral Q4H PRN Celinda Alm Lot, MD   5 mg at 11/25/23 1831   PARoxetine  (PAXIL -CR) 24 hr tablet 50 mg  50 mg Oral QPM Celinda Alm Lot, MD   50 mg at 11/26/23 1809   pioglitazone  (ACTOS ) tablet 15 mg  15 mg Oral q morning Celinda Alm Lot, MD   15 mg at 11/27/23 0830   polyethylene glycol (MIRALAX  / GLYCOLAX ) packet 17 g  17 g Oral Daily PRN Chavez, Abigail, NP   17 g at 11/27/23 0830     Discharge Medications: Please see discharge summary for a list of discharge medications.  Relevant Imaging Results:  Relevant Lab Results:   Additional Information SSN: [id number]  Heather LABOR Chantele Corado, LCSW

## 2023-11-27 NOTE — Progress Notes (Signed)
 Occupational Therapy Treatment Patient Details Name: Katherine Martin MRN: [id number] DOB: 1946-02-03 Today's Date: 11/27/2023   History of present illness 78 y.o. female who had a fall about a week ago injuring her left hip, then 11/22/23 felt a pop and had pain in L hip with sit to stand. Dx of greater trochanter fx, nonoperative tx plan. 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   OT comments  Pt making progress with functional goals. Pt in bed upon arrival and agreeable to OOB activity with OT. Pt CGA with increased time to complete sup-sit to EOB, min A to stand to RW with cues for hand placement, CGA to walk to bathroom min A for clothing gmt at toilet, CGA to transfer to commode, min A to stand from commode to RW using grab bar for safety. Pt stood at sink for grooming/hygiene CGA, walked back to bed CGA and, sat EOB and participated in LB ADL tasks min A. Pt declined sitting in recliner and CGA with LEs mgt to return to supine in bed. OT will continue to follow acutely to maximize level of function and safety      If plan is discharge home, recommend the following:  A little help with walking and/or transfers;A little help with bathing/dressing/bathroom;Assistance with cooking/housework;Assist for transportation   Equipment Recommendations  BSC/3in1    Recommendations for Other Services      Precautions / Restrictions Precautions Precautions: Fall Recall of Precautions/Restrictions: Impaired Precaution/Restrictions Comments: Limit hip abduction based on symptoms per ortho Restrictions Weight Bearing Restrictions Per Provider Order: No LLE Weight Bearing Per Provider Order: Weight bearing as tolerated Other  Position/Activity Restrictions: WBAT       Mobility Bed Mobility Overal bed mobility: Needs Assistance Bed Mobility: Supine to Sit, Sit to Supine     Supine to sit: Contact guard, HOB elevated, Used rails Sit to supine: Contact guard assist   General bed mobility comments: Increased time. Cues provided.    Transfers Overall transfer level: Needs assistance Equipment used: Rolling walker (2 wheels) Transfers: Sit to/from Stand Sit to Stand: Min assist     Step pivot transfers: Contact guard assist     General transfer comment: Cues for safety, proper hand placement     Balance Overall balance assessment: Needs assistance, History of Falls   Sitting balance-Leahy Scale: Good     Standing balance support: Bilateral upper extremity supported, During functional activity, Reliant on assistive device for balance Standing balance-Leahy Scale: Poor Standing balance comment: min A/CGA  for balance and safety                           ADL either performed or assessed with clinical judgement   ADL Overall ADL's : Needs assistance/impaired     Grooming: Wash/dry hands;Wash/dry face;Contact guard assist;Standing       Lower Body Bathing: Minimal assistance;Sit to/from stand       Lower Body Dressing: Minimal assistance;Sit to/from stand   Toilet Transfer: Contact guard assist;Minimal assistance;Ambulation;BSC/3in1;Cueing for safety;Regular Toilet;Grab bars   Toileting- Clothing Manipulation and Hygiene: Minimal assistance;Sit to/from stand       Functional mobility during ADLs: Minimal assistance;Contact guard assist;Rolling walker (2 wheels);Cueing for safety      Extremity/Trunk Assessment Upper Extremity  Assessment Upper Extremity Assessment: Overall WFL for tasks assessed   Lower Extremity Assessment Lower Extremity Assessment: Defer to PT evaluation   Cervical / Trunk Assessment Cervical / Trunk Assessment: Normal    Vision Baseline  Vision/History: 1 Wears glasses Patient Visual Report: No change from baseline     Perception     Praxis     Communication Communication Communication: No apparent difficulties   Cognition Arousal: Alert Behavior During Therapy: WFL for tasks assessed/performed Cognition: No apparent impairments                               Following commands: Intact        Cueing   Cueing Techniques: Verbal cues  Exercises      Shoulder Instructions       General Comments      Pertinent Vitals/ Pain       Pain Assessment Pain Assessment: Faces Pain Score: 5  Pain Location: L hip Pain Descriptors / Indicators: Sore, Discomfort Pain Intervention(s): Monitored during session, Repositioned  Home Living                                          Prior Functioning/Environment              Frequency  Min 2X/week        Progress Toward Goals  OT Goals(current goals can now be found in the care plan section)  Progress towards OT goals: Progressing toward goals     Plan      Co-evaluation                 AM-PAC OT 6 Clicks Daily Activity     Outcome Measure   Help from another person eating meals?: None Help from another person taking care of personal grooming?: A Little Help from another person toileting, which includes using toliet, bedpan, or urinal?: A Little Help from another person bathing (including washing, rinsing, drying)?: A Little Help from another person to put on and taking off regular upper body clothing?: A Little Help from another person to put on and taking off regular lower body clothing?: A Little 6 Click Score: 19    End of Session Equipment Utilized During Treatment: Rolling walker (2 wheels);Gait belt  OT Visit Diagnosis: Other abnormalities of gait and mobility (R26.89);Muscle weakness (generalized) (M62.81)   Activity Tolerance Patient tolerated treatment well   Patient Left with call bell/phone  within reach;in bed;with bed alarm set   Nurse Communication Mobility status        Time: 8876-8850 OT Time Calculation (min): 26 min  Charges: OT General Charges $OT Visit: 1 Visit OT Treatments $Self Care/Home Management : 8-22 mins $Therapeutic Activity: 8-22 mins    Jacques Karna Loose 11/27/2023, 1:38 PM

## 2023-11-27 NOTE — Progress Notes (Signed)
  Progress Note   Patient: Katherine Martin FMW:[id number] DOB: [birth year] DOA: 11/22/2023     0 DOS: the patient was seen and examined on 11/27/2023   Brief hospital course: 78 y.o. 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  She has been having pain since then, but yesterday evening she stood up out of her recliner, heard the pop in her left hip area developing increased pain and difficulty ambulating after this. He denied fever, chills, rhinorrhea, sore throat, wheezing or hemoptysis.  No chest pain, palpitations, diaphoresis, PND, orthopnea or pitting edema of the lower extremities.  No abdominal pain, nausea, emesis, diarrhea, constipation, melena or hematochezia.  No flank pain, dysuria, frequency or hematuria.  No polyuria, polydipsia, polyphagia or blurred vision.   Assessment and Plan: Principal Problem:   Greater trochanter fracture [facility]) Orthopedic Surgery consulted. Recs for non-operative treatment with WBAT LLE and f/u in 2 weeks for repeat imaging with Ortho -PT/OT consulted, recs for SNF. TOC is following   Active Problems:   Hyponatremia Normalized     Normocytic anemia Normalized     CAD (coronary artery disease)   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 Continue aspirin  81 mg p.o. daily. Might benefit from statin therapy.     Dyslipidemia Follow-up with primary care provider.     Hypertension Continue valsartan 80 mg p.o. twice daily or formulary equivalent.     Type 2 diabetes mellitus (HCC) Carbohydrate modified diet. Continue pioglitazone  15 mg p.o. daily. CBG monitoring with RI SS. A1c 5.9     Depression Continue paroxetine  CR 50 mg p.o. daily.  R thigh mass -See photo in media  tab -Large fleshy, nontender lesion over R thigh noted and seen on exam -Pt reports following Dermatology as outpatient who is aware. Recommend close f/u with Dermatology      Subjective: No complaints this AM  Physical Exam: Vitals:   11/26/23 2102 11/27/23 0515 11/27/23 0834 11/27/23 1300  BP: (!) 161/50 (!) 165/53 (!) 138/52 (!) 145/57  Pulse: 61 67 73 64  Resp: 17 16 16 18   Temp: 98.2 F (36.8 C) (!) 97.5 F (36.4 C) 97.6 F (36.4 C) 97.9 F (36.6 C)  TempSrc: Oral Oral Oral Oral  SpO2: 98% 100% 98%   Weight:      Martin:       General exam: Conversant, in no acute distress Respiratory system: normal chest rise, clear, no audible wheezing Cardiovascular system: regular rhythm, s1-s2 Gastrointestinal system: Nondistended, nontender, pos BS Central nervous system: No seizures, no tremors Extremities: No cyanosis, no joint deformities Skin: No rashes, no pallor, large fleshy nontender, nonerythematous mass over R thigh Psychiatry: Affect normal // no auditory hallucinations    Data Reviewed:  There are no new results to review at this time.  Family Communication: Pt in room, family not at bedside  Disposition: Status is: Observation The patient remains OBS appropriate and will d/c before 2 midnights.  Planned Discharge Destination: Skilled nursing facility    Author: Garnette Pelt, MD 11/27/2023 6:14 PM  For on call review [email or web address].

## 2023-11-27 NOTE — TOC Progression Note (Signed)
 Transition of Care [facility]) - Progression Note    Patient Details  Name: Josefina Rynders MRN: [id number] Date of Birth: [birth year]  Transition of Care [facility]) CM/SW Contact  Heather DELENA Saltness, LCSW Phone Number: 11/27/2023, 11:06 AM  Clinical Narrative:    CSW met with pt at bedside to discuss PT's recommendation for short-term SNF rehab. Pt agreeable to short-term rehab at SNF. CSW completed FL2 and sent referrals to SNF locations in [place] and surrounding areas. Bed offers currently pending. TOC will continue to follow.   Expected Discharge Plan:  (TBD) Barriers to Discharge: Continued Medical Work up   Expected Discharge Plan and Services In-house Referral: Clinical Social Work     Living arrangements for the past 2 months: Apartment                   Social Drivers of Health (SDOH) Interventions SDOH Screenings   Food Insecurity: No Food Insecurity (11/23/2023)  Housing: Low Risk  (11/23/2023)  Transportation Needs: No Transportation Needs (11/23/2023)  Utilities: Not At Risk (11/23/2023)  Social Connections: Socially Isolated (11/23/2023)  Tobacco Use: High Risk (11/22/2023)    Readmission Risk Interventions     No data to display          Signed: Heather Saltness, MSW, LCSW Clinical Social Worker Inpatient Care Management 11/27/2023 11:08 AM

## 2023-11-27 NOTE — Plan of Care (Signed)
 ?  Problem: Clinical Measurements: ?Goal: Ability to maintain clinical measurements within normal limits will improve ?Outcome: Progressing ?Goal: Will remain free from infection ?Outcome: Progressing ?Goal: Diagnostic test results will improve ?Outcome: Progressing ?  ?

## 2023-11-27 NOTE — Progress Notes (Signed)
 Physical Therapy Treatment Patient Details Name: Katherine Martin MRN: [id number] DOB: 11-Oct-1945 Today's Date: 11/27/2023   History of Present Illness 78 y.o. female who had a fall about a week ago injuring her left hip, then 11/22/23 felt a pop and had pain in L hip with sit to stand. Dx of greater trochanter fx, nonoperative tx plan. 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    PT Comments  Pt agreeable to working with therapy. She continues to exhibits some short term memory deficits-did not recall walking with me on yesterday. She participated well. Continues to report pain with activity. She also reports fatigue on today. She remains at risk for falls when mobilizing. Patient will benefit from continued inpatient follow up therapy, <3 hours/day.     If plan is discharge home, recommend the following: A little help with walking and/or transfers;A little help with bathing/dressing/bathroom;Assistance with cooking/housework;Assist for transportation;Help with stairs or ramp for entrance   Can travel by private vehicle        Equipment Recommendations       Recommendations for Other Services       Precautions / Restrictions Precautions Recall of Precautions/Restrictions: Impaired Precaution/Restrictions Comments: Limit hip abduction based on symptoms per ortho Restrictions Weight Bearing Restrictions Per Provider Order: No LLE Weight Bearing Per Provider Order: Weight bearing as tolerated     Mobility  Bed Mobility Overal bed mobility: Needs Assistance Bed Mobility: Supine to Sit     Supine to sit: Contact guard, HOB elevated, Used rails     General bed mobility comments: Exited on R side of bed. Increased time.  Cues provided.    Transfers Overall transfer level: Needs assistance Equipment used: Rolling walker (2 wheels) Transfers: Sit to/from Stand Sit to Stand: Min assist           General transfer comment: Cues for safety, proper hand placement. Assist to steady while rising. LOB x 1 when rising from toilet in bathroom-assist to prevent fall.    Ambulation/Gait Ambulation/Gait assistance: Min assist Gait Distance (Feet): 28 Feet Assistive device: Rolling walker (2 wheels)         General Gait Details: Assist to steady intermittently. Slow gait speed. Distance limited by fatigue and pain.   Stairs             Wheelchair Mobility     Tilt Bed    Modified Rankin (Stroke Patients Only)       Balance Overall balance assessment: Needs assistance, History of Falls         Standing balance support: Bilateral upper extremity supported, During functional activity, Reliant on assistive device for balance Standing balance-Leahy Scale: Poor                              Communication Communication Communication: No apparent difficulties  Cognition Arousal: Alert Behavior During Therapy: WFL for tasks assessed/performed   PT - Cognitive impairments: Memory                         Following commands: Intact      Cueing Cueing Techniques: Verbal cues  Exercises Total Joint Exercises Ankle Circles/Pumps: AROM, Both, 10 reps Quad Sets: AROM, Both, 10  reps Heel Slides: AROM, Left, 10 reps    General Comments        Pertinent Vitals/Pain Pain Assessment Pain Assessment: Faces Faces Pain Scale: Hurts little more Pain Location: L hip Pain Descriptors / Indicators: Sore, Guarding, Grimacing Pain Intervention(s): Limited activity within patient's tolerance, Monitored during session, Repositioned    Home Living                          Prior Function            PT Goals (current goals can now be found in the care plan  section) Progress towards PT goals: Progressing toward goals    Frequency    Min 3X/week      PT Plan      Co-evaluation              AM-PAC PT 6 Clicks Mobility   Outcome Measure  Help needed turning from your back to your side while in a flat bed without using bedrails?: A Little Help needed moving from lying on your back to sitting on the side of a flat bed without using bedrails?: A Little Help needed moving to and from a bed to a chair (including a wheelchair)?: A Little Help needed standing up from a chair using your arms (e.g., wheelchair or bedside chair)?: A Little Help needed to walk in hospital room?: A Little Help needed climbing 3-5 steps with a railing? : A Lot 6 Click Score: 17    End of Session Equipment Utilized During Treatment: Gait belt Activity Tolerance: Patient tolerated treatment well;Patient limited by pain;Patient limited by fatigue Patient left: in chair;with call bell/phone within reach;with chair alarm set   PT Visit Diagnosis: Difficulty in walking, not elsewhere classified (R26.2);Pain;Muscle weakness (generalized) (M62.81);History of falling (Z91.81) Pain - Right/Left: Left Pain - part of body: Hip     Time: 9056-8990 PT Time Calculation (min) (ACUTE ONLY): 26 min  Charges:    $Gait Training: 23-37 mins PT General Charges $$ ACUTE PT VISIT: 1 Visit                        Dannial SQUIBB, PT Acute Rehabilitation  Office: [phone]

## 2023-11-28 DIAGNOSIS — S72112A Displaced fracture of greater trochanter of left femur, initial encounter for closed fracture: Secondary | ICD-10-CM | POA: Diagnosis not present

## 2023-11-28 DIAGNOSIS — Z8781 Personal history of (healed) traumatic fracture: Secondary | ICD-10-CM | POA: Diagnosis not present

## 2023-11-28 LAB — GLUCOSE, CAPILLARY
Glucose-Capillary: 85 mg/dL (ref 70–99)
Glucose-Capillary: 128 mg/dL — ABNORMAL HIGH (ref 70–99)
Glucose-Capillary: 88 mg/dL (ref 70–99)
Glucose-Capillary: 102 mg/dL — ABNORMAL HIGH (ref 70–99)

## 2023-11-28 NOTE — Progress Notes (Signed)
  Progress Note   Patient: Katherine Martin FMW:[id number] DOB: [birth year] DOA: 11/22/2023     0 DOS: the patient was seen and examined on 11/28/2023   Brief hospital course: 78 y.o. 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  She has been having pain since then, but yesterday evening she stood up out of her recliner, heard the pop in her left hip area developing increased pain and difficulty ambulating after this. He denied fever, chills, rhinorrhea, sore throat, wheezing or hemoptysis.  No chest pain, palpitations, diaphoresis, PND, orthopnea or pitting edema of the lower extremities.  No abdominal pain, nausea, emesis, diarrhea, constipation, melena or hematochezia.  No flank pain, dysuria, frequency or hematuria.  No polyuria, polydipsia, polyphagia or blurred vision.   Assessment and Plan: Principal Problem:   Greater trochanter fracture [facility]) Orthopedic Surgery consulted. Recs for non-operative treatment with WBAT LLE and f/u in 2 weeks for repeat imaging with Ortho -PT/OT consulted, recs for SNF -TOC is following. Insurance authorization is underway   Active Problems:   Hyponatremia Normalized     Normocytic anemia Normalized     CAD (coronary artery disease)   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 Continue aspirin  81 mg p.o. daily. Might benefit from statin therapy.     Dyslipidemia Follow-up with primary care provider.     Hypertension Continue valsartan 80 mg p.o. twice daily or formulary equivalent.     Type 2 diabetes mellitus (HCC) Carbohydrate modified diet. Continue pioglitazone  15 mg p.o. daily. CBG monitoring with RI SS. A1c 5.9     Depression Continue paroxetine  CR 50 mg p.o.  daily.  R thigh mass -See photo in media tab -Large fleshy, nontender lesion over R thigh noted and seen on exam -Pt reports following Dermatology as outpatient who is aware. Recommend close f/u with Dermatology      Subjective: Did not sleep very well overnight  Physical Exam: Vitals:   11/27/23 1300 11/27/23 2134 11/28/23 0540 11/28/23 1332  BP: (!) 145/57 (!) 155/57 107/84 (!) 140/48  Pulse: 64 70 72 (!) 58  Resp: 18 19 15 17   Temp: 97.9 F (36.6 C) 97.7 F (36.5 C) 97.8 F (36.6 C) 97.8 F (36.6 C)  TempSrc: Oral Oral Oral   SpO2:  95% 99% 99%  Weight:      Height:       General exam: Awake, laying in bed, in nad Respiratory system: Normal respiratory effort, no wheezing Cardiovascular system: regular rate, s1, s2 Gastrointestinal system: Soft, nondistended, positive BS Central nervous system: CN2-12 grossly intact, strength intact Extremities: Perfused, no clubbing Skin: Normal skin turgor, no rashes Psychiatry: Mood normal // no visual hallucinations    Data Reviewed:  There are no new results to review at this time.  Family Communication: Pt in room, family not at bedside  Disposition: Status is: Observation The patient remains OBS appropriate and will d/c before 2 midnights.  Planned Discharge Destination: Skilled nursing facility    Author: Garnette Pelt, MD 11/28/2023 3:59 PM  For on call review [email or web address].

## 2023-11-28 NOTE — TOC Progression Note (Addendum)
 Transition of Care [facility]) - Progression Note    Patient Details  Name: Katherine Martin MRN: [id number] Date of Birth: 09/03/1945  [facility] CM/SW Contact  NORMAN ASPEN, [place] Phone Number: 11/28/2023, 2:17 PM  Clinical Narrative:     Addendum: Have received ins shara #[id number].  Good 9/10-9/12/25  Have reviewed SNF bed offers with pt and daughter, [name] and they have chosen [facility] and [facility].  Have confirmed with facility that they should have available bed open tomorrow.  Insurance authorization begun.  Expected Discharge Plan:  (TBD) Barriers to Discharge: Continued Medical Work up               Expected Discharge Plan and Services In-house Referral: Clinical Social Work     Living arrangements for the past 2 months: Apartment                                       Social Drivers of Health (SDOH) Interventions SDOH Screenings   Food Insecurity: No Food Insecurity (11/23/2023)  Housing: Low Risk  (11/23/2023)  Transportation Needs: No Transportation Needs (11/23/2023)  Utilities: Not At Risk (11/23/2023)  Social Connections: Socially Isolated (11/23/2023)  Tobacco Use: High Risk (11/22/2023)    Readmission Risk Interventions     No data to display

## 2023-11-29 DIAGNOSIS — S72112A Displaced fracture of greater trochanter of left femur, initial encounter for closed fracture: Secondary | ICD-10-CM | POA: Diagnosis not present

## 2023-11-29 DIAGNOSIS — E039 Hypothyroidism, unspecified: Secondary | ICD-10-CM | POA: Diagnosis not present

## 2023-11-29 LAB — CBC
HCT: 39.1 % (ref 36.0–46.0)
Hemoglobin: 13 g/dL (ref 12.0–15.0)
MCH: 31.8 pg (ref 26.0–34.0)
MCHC: 33.2 g/dL (ref 30.0–36.0)
MCV: 95.6 fL (ref 80.0–100.0)
Platelets: 333 K/uL (ref 150–400)
RBC: 4.09 MIL/uL (ref 3.87–5.11)
RDW: 13.2 % (ref 11.5–15.5)
WBC: 8.8 K/uL (ref 4.0–10.5)
nRBC: 0 % (ref 0.0–0.2)

## 2023-11-29 LAB — GLUCOSE, CAPILLARY
Glucose-Capillary: 88 mg/dL (ref 70–99)
Glucose-Capillary: 98 mg/dL (ref 70–99)

## 2023-11-29 LAB — COMPREHENSIVE METABOLIC PANEL WITH GFR
ALT: 13 U/L (ref 0–44)
AST: 19 U/L (ref 15–41)
Albumin: 3.8 g/dL (ref 3.5–5.0)
Alkaline Phosphatase: 116 U/L (ref 38–126)
Anion gap: 13 (ref 5–15)
BUN: 19 mg/dL (ref 8–23)
CO2: 23 mmol/L (ref 22–32)
Calcium: 9.9 mg/dL (ref 8.9–10.3)
Chloride: 99 mmol/L (ref 98–111)
Creatinine, Ser: 0.82 mg/dL (ref 0.44–1.00)
GFR, Estimated: >60 mL/min (ref >60)
Glucose, Bld: 83 mg/dL (ref 70–99)
Potassium: 5.3 mmol/L — ABNORMAL HIGH (ref 3.5–5.1)
Sodium: 135 mmol/L (ref 135–145)
Total Bilirubin: 0.4 mg/dL (ref 0.0–1.2)
Total Protein: 7 g/dL (ref 6.5–8.1)

## 2023-11-29 MED ORDER — OXYCODONE HCL 5 MG PO TABS: 5.0000 mg | ORAL_TABLET | Freq: Four times a day (QID) | ORAL | 0 refills | Status: DC | PRN

## 2023-11-29 MED ORDER — SODIUM ZIRCONIUM CYCLOSILICATE 10 G PO PACK
10.0000 g | PACK | Freq: Once | ORAL | Status: AC
Start: 2023-11-29 — End: 2023-11-29
  Administered 2023-11-29: 10 g via ORAL
  Filled 2023-11-29: qty 1

## 2023-11-29 MED ORDER — ACETAMINOPHEN 325 MG PO TABS: 650.0000 mg | ORAL_TABLET | Freq: Four times a day (QID) | ORAL | Status: AC | PRN

## 2023-11-29 MED ORDER — ONDANSETRON HCL 4 MG PO TABS: 4.0000 mg | ORAL_TABLET | Freq: Four times a day (QID) | ORAL | 0 refills | Status: AC | PRN

## 2023-11-29 MED ORDER — ASPIRIN 81 MG PO TABS: 81.0000 mg | ORAL_TABLET | Freq: Two times a day (BID) | ORAL | 1 refills | Status: AC

## 2023-11-29 NOTE — Discharge Summary (Signed)
 Katherine Martin, is a 78 y.o. female  DOB January 29, 1946  MRN [id number].  Admission date:  11/22/2023  Admitting Physician  Evalene GORMAN Sprinkles, MD  Discharge Date:  11/29/2023   Primary MD  Clarice Nottingham, MD  Recommendations for primary care physician for things to follow:  1)-WBAT left lower extremity, limit hip abduction based on symptoms/pain 2)Orthopedic follow up with Dr Jerri, Naiping in  2 weeks as outpatient outpatient for  reevaluation and repeat imaging 3)Aspirin  81 mg twice daily with meals as prescribed for DVT prophylaxis until mobility improves at which time aspirin  can be reduced to PTA dose of 81 mg daily 4) strongly recommend outpatient follow-up with general surgery and dermatology for biopsy of your right thigh mass   Admission Diagnosis  Greater trochanter fracture (HCC) [S72.113A] S/p left hip fracture [Z87.81]   Discharge Diagnosis  Greater trochanter fracture (HCC) [S72.113A] S/p left hip fracture [Z87.81]    Principal Problem:   Displaced fracture of greater trochanter of left femur, initial encounter for closed fracture [facility]) Active Problems:   CAD (coronary artery disease)   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   Dyslipidemia   Hypertension   Type 2 diabetes mellitus (HCC)   Depression   Hyponatremia   Normocytic anemia      Past Medical History:  Diagnosis Date   Anxiety    Complication of anesthesia 1990s   had trouble waking me up   Coronary artery disease, occlusive    cath 2007  - RCA 100%, Severe distal Circumflex   Daily headache    last couple weeks (07/09/2015)   Depression    Dyslipidemia    Hypertension    Hypothyroidism    Obesity (BMI 30.0-34.9)    PONV (postoperative nausea and vomiting)    PVD (peripheral vascular disease) (HCC)    LAA Dopplers, November 2013: ABIs--0.67 right, 0.65 left bilateral common iliacs less than 50% stenosis, bilateral  SFA demonstrate exclusive disease with reconstitution in the proximal segment of bronchial arteries.) He artery occluded with 2 vessel runoff on the right, 3 vessel run off on left.   Tobacco use    Type 2 diabetes mellitus (HCC)     Past Surgical History:  Procedure Laterality Date   CARDIAC CATHETERIZATION  03/06/2006   100% RCA, Severe distal Circumflex stenosis, moderate LAD; EF ~60% with mild anterior hypokinesis. (Dr. DOROTHA Gent)   CHOLECYSTECTOMY OPEN     DILATION AND CURETTAGE OF UTERUS     Lower Extremity Arterial Doppler  01/2012   bilateral ABI demonstrate moderate arterial insuff at rest; abd aorta with non-hemodynamically significant plaque; bilat CIA with equal/less than 50% diameter reduction; bilat SFA with occlusive disease & reconstiution in prox popliteal; R peroneal not visualized   Lower Extremity Arterial Doppler  September 2014   RABI: 0.55; LABI 0.53; moderate CEA, bilateral SFA-occlusive with reconstitution and proximal Popliteal; bilateral Peroneal artery occlusion.   NM MYOVIEW LTD - Treadmill  02/2009   Walk 6 minutes. No ischemia or infarction.   TONSILLECTOMY  HPI  from the history and physical done on the day of admission:    HPI: Katherine Martin is a 78 y.o. 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  She has been having pain since then, but yesterday evening she stood up out of her recliner, heard the pop in her left hip area developing increased pain and difficulty ambulating after this. He denied fever, chills, rhinorrhea, sore throat, wheezing or hemoptysis.  No chest pain, palpitations, diaphoresis, PND, orthopnea or pitting edema of the lower extremities.  No abdominal pain, nausea,  emesis, diarrhea, constipation, melena or hematochezia.  No flank pain, dysuria, frequency or hematuria.  No polyuria, polydipsia, polyphagia or blurred vision.    Lab work: CBC showed a white count of 9.1, hemoglobin 11.4 g/dL and platelets 698.  BMP shows sodium 132, potassium 4.1, chloride 97 and CO2 24 mmol/L.  Glucose 114 mg/dL.  Normal renal function and calcium  level.   Imaging: Left hip x-ray showing questionable transverse lucency projecting over the greater trochanter, indeterminate for fracture versus overlying artifact.  CT recommended for further assessment.  CT of the left hip show an acute nondisplaced transverse fracture through the posterior superior facet of the left greater trochanter.  No further fractures are seen.  There is mild osteopenia and degenerative change.  Iliofemoral atherosclerosis.  Mild subcutaneous stranding in the lateral left hip region likely posttraumatic.  No hematoma.   ED course: Initial vital signs were temperature 98.5 F, pulse 75, respirations 20, BP 204/68 mmHg O2 sat 100% on room air.  The patient received oxycodone  5 mg p.o. x 1 dose.   Review of Systems: As mentioned in the history of present illness. All other systems reviewed and are negative.     Hospital Course:    Greater trochanter fracture [facility]) -CT of the left hip shows acute nondisplaced transverse fracture through the posterosuperior facet of the left greater trochanter. Orthopedic Surgery consulted. 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  daily. Might benefit from statin therapy.     Hypertension Continue valsartan       Type 2 diabetes mellitus (HCC) - A1c 5.9 reflecting excellent diabetic control PTA -Continue Actos  and diabetic diet     Depression Continue paroxetine  CR 50 mg p.o. daily.   R thigh mass -See photo in media tab -Large fleshy, nontender lesion over R thigh noted and seen on exam -Pt reports following Dermatology as outpatient who is aware.  -Recommend close f/u with Dermatology or general surgery for biopsy    Mild HyperKalemia--Lokelma  given, renal function WNL   Hypothyroidism--continue levothyroxine   Discharge Condition: Stable for discharge to SNF rehab  Follow UP   Follow-up Information     Jerri Kay HERO, MD Follow up in 2 week(s).   Specialty: Orthopedic Surgery Contact information: [address] [place]  [place] [address] [phone]  Consults obtained -orthopedics  Diet and Activity recommendation:  As advised  Discharge Instructions    Discharge Instructions     Call MD for:  difficulty breathing, headache or visual disturbances   Complete by: As directed    Call MD for:  persistant dizziness or light-headedness   Complete by: As directed    Call MD for:  persistant nausea and vomiting   Complete by: As directed    Call MD for:  severe uncontrolled pain   Complete by: As directed    Call MD for:  temperature >100.4   Complete by: As directed    Diet - low sodium heart healthy   Complete by: As directed    Discharge instructions   Complete by: As directed    1)-WBAT left lower extremity, limit hip abduction based on symptoms/pain 2)Orthopedic follow up with Dr Jerri, Naiping in  2 weeks as outpatient outpatient for  reevaluation and repeat imaging 3)Aspirin  81 mg twice daily with meals as prescribed for DVT prophylaxis until mobility improves at which time aspirin  can be reduced to PTA dose of 81 mg daily 4) strongly recommend outpatient  follow-up with general surgery and dermatology for biopsy of your right thigh mass   Increase activity slowly   Complete by: As directed    No wound care   Complete by: As directed        Discharge Medications     Allergies as of 11/29/2023       Reactions   Clindamycin/lincomycin Anaphylaxis   Buspar [buspirone]    Crestor  [rosuvastatin  Calcium ] Other (See Comments)   Extreme weakness and vomiting    Demerol [meperidine] Hives   Effexor [venlafaxine]    Erythromycin Hives   Levoxyl  [levothyroxine ]    Other Hives   peaches   Peach [prunus Persica] Hives   Penicillins Hives   Did it involve swelling of the face/tongue/throat, SOB, or low BP? No Did it involve sudden or severe rash/hives, skin peeling, or any reaction on the inside of your mouth or nose? Yes Did you need to seek medical attention at a hospital or doctor's office? Yes When did it last happen?      a long time ago If all above answers are NO, may proceed with cephalosporin use.   Prednisone Hives   Sular [nisoldipine Er]    Zithromax [azithromycin]    Zoloft [sertraline Hcl]         Medication List     TAKE these medications    acetaminophen  325 MG tablet Commonly known as: TYLENOL  Take 2 tablets (650 mg total) by mouth every 6 (six) hours as needed for mild pain (pain score 1-3) or fever (or Fever >/= 101).   Actos  15 MG tablet Generic drug: pioglitazone  Take 15 mg by mouth every morning.   aspirin  81 MG tablet Take 1 tablet (81 mg total) by mouth 2 (two) times daily with a meal. What changed: when to take this   calcium  carbonate 500 MG chewable tablet Commonly known as: TUMS - dosed in mg elemental calcium  Chew 1 tablet by mouth daily as needed for indigestion or heartburn.   cholecalciferol  25 MCG (1000 UNIT) tablet Commonly known as: VITAMIN D3 Take 1,000 Units by mouth daily.   levothyroxine  100 MCG tablet Commonly known as: SYNTHROID  Take 100 mcg by mouth daily before breakfast.    ondansetron  4 MG tablet Commonly known as: ZOFRAN  Take 1 tablet (4 mg total) by mouth every 6 (six) hours as needed  for nausea.   oxyCODONE  5 MG immediate release tablet Commonly known as: Oxy IR/ROXICODONE  Take 1 tablet (5 mg total) by mouth every 6 (six) hours as needed for moderate pain (pain score 4-6).   Paxil  CR 25 MG 24 hr tablet Generic drug: PARoxetine  Take 50 mg by mouth every evening.   valsartan 80 MG tablet Commonly known as: DIOVAN Take 80 mg by mouth 2 (two) times daily.        Major procedures and Radiology Reports - PLEASE review detailed and final reports for all details, in brief -   CT Hip Left Wo Contrast Result Date: 11/23/2023 CLINICAL DATA:  Fracture left greater trochanter. Check further fractures. EXAM: CT OF THE LEFT HIP WITHOUT CONTRAST TECHNIQUE: Multidetector CT imaging of the left hip was performed according to the standard protocol. Multiplanar CT image reconstructions were also generated. RADIATION DOSE REDUCTION: This exam was performed according to the departmental dose-optimization program which includes automated exposure control, adjustment of the mA and/or kV according to patient size and/or use of iterative reconstruction technique. COMPARISON:  AP pelvis and left hip views from yesterday. FINDINGS: Bones/Joint/Cartilage Mild osteopenia. There is an acute nondisplaced transverse fracture through the posterosuperior facet of the greater trochanter. No further fractures are seen. No dislocation. There is mild nonerosive arthrosis at the left hip, slight spurring SI joints, symphysis pubis and trace enthesopathic change along the greater trochanter left inferior pubic ramus. There is no evidence of other focal bone abnormality, no suspicious regional bone lesion. Ligaments Suboptimally assessed by CT. Muscles and Tendons There are mild fatty atrophic changes in the left gluteal musculature. Other muscle bulk is preserved. No acute abnormality is seen of  the regional tendons. Soft tissues There are mild subcutaneous stranding changes in the lateral left hip region likely posttraumatic. There is no hematoma. Visualized portions of the bladder, uterus and adnexal structures are unremarkable for age. Within the proper pelvis there is no free fluid, free air or free hemorrhage in the visualized portion. The left internal iliac artery, the left common femoral artery, and proximal left superficial femoral artery are heavily calcified with scattered calcification in the deep femoral artery. IMPRESSION: 1. Acute nondisplaced transverse fracture through the posterosuperior facet of the left greater trochanter. 2. No further fractures are seen. 3. Mild osteopenia and degenerative change. 4. Iliofemoral atherosclerosis. 5. Mild subcutaneous stranding in the lateral left hip region likely posttraumatic. No hematoma. Electronically Signed   By: Francis Quam M.D.   On: 11/23/2023 03:38   DG Hip Unilat With Pelvis 2-3 Views Left Result Date: 11/22/2023 CLINICAL DATA:  Left hip pain, fall earlier today, felt a pop. EXAM: DG HIP (WITH OR WITHOUT PELVIS) 2-3V LEFT COMPARISON:  None Available. FINDINGS: Questionable transverse lucency projects over the greater trochanter, indeterminate for fracture versus overlying artifact. No other fracture of the hip. No hip dislocation. Pubic rami are intact. Minimal degenerative change with peripheral spurring. No pubic symphyseal or sacroiliac diastasis. Prominent vascular calcifications. IMPRESSION: Questionable transverse lucency projects over the greater trochanter, indeterminate for fracture versus overlying artifact. Recommend CT for further assessment. Electronically Signed   By: Andrea Gasman M.D.   On: 11/22/2023 23:51    Micro Results   Today   Subjective    Katherine Martin today has no new concerns No fever  Or chills   No Nausea, Vomiting or Diarrhea -- Pain control improved      Patient has been seen and examined  prior to discharge   Objective  Blood pressure (!) 158/53, pulse 71, temperature 98.5 F (36.9 C), resp. rate 18, height 5' 1 (1.549 m), weight 65.8 kg, SpO2 95%.   Intake/Output Summary (Last 24 hours) at 11/29/2023 1112 Last data filed at 11/29/2023 0923 Gross per 24 hour  Intake 750 ml  Output 425 ml  Net 325 ml    Exam Gen:- Awake Alert, no acute distress  HEENT:- [place].AT, No sclera icterus Neck-Supple Neck,No JVD,.  Lungs-  CTAB , good air movement bilaterally CV- S1, S2 normal, regular Abd-  +ve B.Sounds, Abd Soft, No tenderness,    Extremity/Skin:- No  edema,   good pulses, point tenderness on palpation of right hip area as well as with range of motion, no obvious bruising or deformity Psych-affect is appropriate, oriented x3 Neuro-no new focal deficits, no tremors  Rt Thigh Mass---   Media Information   Document Information  Photos  Mass right thigh  11/23/2023 07:45  Attached To:  Hospital Encounter on 11/22/23  Source Information  Waddell Delon LABOR, RN  [facility]     Data Review   CBC w Diff:  Lab Results  Component Value Date   WBC 8.8 11/29/2023   HGB 13.0 11/29/2023   HCT 39.1 11/29/2023   PLT 333 11/29/2023   LYMPHOPCT 15 11/23/2023   MONOPCT 8 11/23/2023   EOSPCT 1 11/23/2023   BASOPCT 0 11/23/2023   CMP:  Lab Results  Component Value Date   NA 135 11/29/2023   K 5.3 (H) 11/29/2023   CL 99 11/29/2023   CO2 23 11/29/2023   BUN 19 11/29/2023   CREATININE 0.82 11/29/2023   PROT 7.0 11/29/2023   ALBUMIN 3.8 11/29/2023   BILITOT 0.4 11/29/2023   ALKPHOS 116 11/29/2023   AST 19 11/29/2023   ALT 13 11/29/2023  .  Total Discharge time is about 33 minutes  Rendall Carwin M.D on 11/29/2023 at 11:12 AM  Go to [email or web address] -  for contact info  [facility] - Office  [phone]

## 2023-11-29 NOTE — Discharge Instructions (Addendum)
 1)-WBAT left lower extremity, limit hip abduction based on symptoms/pain 2)Orthopedic follow up with Dr Jerri, Naiping in  2 weeks as outpatient outpatient for  reevaluation and repeat imaging 3)Aspirin  81 mg twice daily with meals as prescribed for DVT prophylaxis until mobility improves at which time aspirin  can be reduced to PTA dose of 81 mg daily 4) strongly recommend outpatient follow-up with general surgery and dermatology for biopsy of your right thigh mass

## 2023-11-29 NOTE — Plan of Care (Signed)
  Problem: Safety: Goal: Ability to remain free from injury will improve Outcome: Progressing   Problem: Pain Management: Goal: Pain level will decrease Outcome: Progressing   Problem: Fluid Volume: Goal: Ability to maintain a balanced intake and output will improve Outcome: Progressing   Problem: Metabolic: Goal: Ability to maintain appropriate glucose levels will improve Outcome: Progressing

## 2023-11-29 NOTE — TOC Transition Note (Signed)
 Transition of Care [facility]) - Discharge Note   Patient Details  Name: Katherine Martin MRN: [id number] Date of Birth: 1945/12/31  Transition of Care [facility] CM/SW Contact:  NORMAN ASPEN, LCSW Phone Number: 11/29/2023, 12:44 PM   Clinical Narrative:     Pt medically cleared for dc today to [facility] and bed is ready.  Pt and daughter aware and agreeable.  PTAR called at 12:45pm.  RN to call report to [phone].  No further IP CM needs  Final next level of care: Skilled Nursing Facility Barriers to Discharge: Barriers Resolved   Patient Goals and CMS Choice Patient states their goals for this hospitalization and ongoing recovery are:: pt hopeful to be able to return home but does live alone          Discharge Placement PASRR number recieved: 11/27/23            Patient chooses bed at: [facility] Patient to be transferred to facility by: PTAR Name of family member notified: daughter Patient and family notified of of transfer: 11/29/23  Discharge Plan and Services Additional resources added to the After Visit Summary for   In-house Referral: Clinical Social Work              DME Arranged: N/A DME Agency: NA                  Social Drivers of Health (SDOH) Interventions SDOH Screenings   Food Insecurity: No Food Insecurity (11/23/2023)  Housing: Low Risk  (11/23/2023)  Transportation Needs: No Transportation Needs (11/23/2023)  Utilities: Not At Risk (11/23/2023)  Social Connections: Socially Isolated (11/23/2023)  Tobacco Use: High Risk (11/22/2023)     Readmission Risk Interventions     No data to display

## 2023-11-30 DIAGNOSIS — R2689 Other abnormalities of gait and mobility: Secondary | ICD-10-CM | POA: Diagnosis not present

## 2023-11-30 DIAGNOSIS — M6281 Muscle weakness (generalized): Secondary | ICD-10-CM | POA: Diagnosis not present

## 2023-11-30 DIAGNOSIS — S72112D Displaced fracture of greater trochanter of left femur, subsequent encounter for closed fracture with routine healing: Secondary | ICD-10-CM | POA: Diagnosis not present

## 2023-11-30 DIAGNOSIS — Z9181 History of falling: Secondary | ICD-10-CM | POA: Diagnosis not present

## 2023-12-01 DIAGNOSIS — E119 Type 2 diabetes mellitus without complications: Secondary | ICD-10-CM | POA: Diagnosis not present

## 2023-12-01 DIAGNOSIS — I10 Essential (primary) hypertension: Secondary | ICD-10-CM | POA: Diagnosis not present

## 2023-12-01 DIAGNOSIS — M6281 Muscle weakness (generalized): Secondary | ICD-10-CM | POA: Diagnosis not present

## 2023-12-01 DIAGNOSIS — S72002D Fracture of unspecified part of neck of left femur, subsequent encounter for closed fracture with routine healing: Secondary | ICD-10-CM | POA: Diagnosis not present

## 2023-12-04 DIAGNOSIS — M6281 Muscle weakness (generalized): Secondary | ICD-10-CM | POA: Diagnosis not present

## 2023-12-04 DIAGNOSIS — L988 Other specified disorders of the skin and subcutaneous tissue: Secondary | ICD-10-CM | POA: Diagnosis not present

## 2023-12-04 DIAGNOSIS — S72002D Fracture of unspecified part of neck of left femur, subsequent encounter for closed fracture with routine healing: Secondary | ICD-10-CM | POA: Diagnosis not present

## 2023-12-04 DIAGNOSIS — Z9181 History of falling: Secondary | ICD-10-CM | POA: Diagnosis not present

## 2023-12-04 DIAGNOSIS — R2689 Other abnormalities of gait and mobility: Secondary | ICD-10-CM | POA: Diagnosis not present

## 2023-12-04 DIAGNOSIS — L89320 Pressure ulcer of left buttock, unstageable: Secondary | ICD-10-CM | POA: Diagnosis not present

## 2023-12-04 DIAGNOSIS — E119 Type 2 diabetes mellitus without complications: Secondary | ICD-10-CM | POA: Diagnosis not present

## 2023-12-04 DIAGNOSIS — I10 Essential (primary) hypertension: Secondary | ICD-10-CM | POA: Diagnosis not present

## 2023-12-04 DIAGNOSIS — S72112D Displaced fracture of greater trochanter of left femur, subsequent encounter for closed fracture with routine healing: Secondary | ICD-10-CM | POA: Diagnosis not present

## 2023-12-06 DIAGNOSIS — M6281 Muscle weakness (generalized): Secondary | ICD-10-CM | POA: Diagnosis not present

## 2023-12-06 DIAGNOSIS — E119 Type 2 diabetes mellitus without complications: Secondary | ICD-10-CM | POA: Diagnosis not present

## 2023-12-06 DIAGNOSIS — S72002D Fracture of unspecified part of neck of left femur, subsequent encounter for closed fracture with routine healing: Secondary | ICD-10-CM | POA: Diagnosis not present

## 2023-12-06 DIAGNOSIS — I10 Essential (primary) hypertension: Secondary | ICD-10-CM | POA: Diagnosis not present

## 2023-12-07 DIAGNOSIS — Z9181 History of falling: Secondary | ICD-10-CM | POA: Diagnosis not present

## 2023-12-07 DIAGNOSIS — S72112D Displaced fracture of greater trochanter of left femur, subsequent encounter for closed fracture with routine healing: Secondary | ICD-10-CM | POA: Diagnosis not present

## 2023-12-07 DIAGNOSIS — R2689 Other abnormalities of gait and mobility: Secondary | ICD-10-CM | POA: Diagnosis not present

## 2023-12-07 DIAGNOSIS — M6281 Muscle weakness (generalized): Secondary | ICD-10-CM | POA: Diagnosis not present

## 2023-12-08 DIAGNOSIS — E119 Type 2 diabetes mellitus without complications: Secondary | ICD-10-CM | POA: Diagnosis not present

## 2023-12-08 DIAGNOSIS — S72112D Displaced fracture of greater trochanter of left femur, subsequent encounter for closed fracture with routine healing: Secondary | ICD-10-CM | POA: Diagnosis not present

## 2023-12-08 DIAGNOSIS — I10 Essential (primary) hypertension: Secondary | ICD-10-CM | POA: Diagnosis not present

## 2023-12-08 DIAGNOSIS — S72002D Fracture of unspecified part of neck of left femur, subsequent encounter for closed fracture with routine healing: Secondary | ICD-10-CM | POA: Diagnosis not present

## 2023-12-08 DIAGNOSIS — I69828 Other speech and language deficits following other cerebrovascular disease: Secondary | ICD-10-CM | POA: Diagnosis not present

## 2023-12-08 DIAGNOSIS — M6281 Muscle weakness (generalized): Secondary | ICD-10-CM | POA: Diagnosis not present

## 2023-12-11 DIAGNOSIS — Z87891 Personal history of nicotine dependence: Secondary | ICD-10-CM | POA: Diagnosis not present

## 2023-12-11 DIAGNOSIS — E669 Obesity, unspecified: Secondary | ICD-10-CM | POA: Diagnosis not present

## 2023-12-11 DIAGNOSIS — I69828 Other speech and language deficits following other cerebrovascular disease: Secondary | ICD-10-CM | POA: Diagnosis not present

## 2023-12-11 DIAGNOSIS — E039 Hypothyroidism, unspecified: Secondary | ICD-10-CM | POA: Diagnosis not present

## 2023-12-11 DIAGNOSIS — M80052D Age-related osteoporosis with current pathological fracture, left femur, subsequent encounter for fracture with routine healing: Secondary | ICD-10-CM | POA: Diagnosis not present

## 2023-12-11 DIAGNOSIS — Z7982 Long term (current) use of aspirin: Secondary | ICD-10-CM | POA: Diagnosis not present

## 2023-12-11 DIAGNOSIS — F32A Depression, unspecified: Secondary | ICD-10-CM | POA: Diagnosis not present

## 2023-12-11 DIAGNOSIS — Z9181 History of falling: Secondary | ICD-10-CM | POA: Diagnosis not present

## 2023-12-11 DIAGNOSIS — I2510 Atherosclerotic heart disease of native coronary artery without angina pectoris: Secondary | ICD-10-CM | POA: Diagnosis not present

## 2023-12-11 DIAGNOSIS — I10 Essential (primary) hypertension: Secondary | ICD-10-CM | POA: Diagnosis not present

## 2023-12-11 DIAGNOSIS — E785 Hyperlipidemia, unspecified: Secondary | ICD-10-CM | POA: Diagnosis not present

## 2023-12-11 DIAGNOSIS — L989 Disorder of the skin and subcutaneous tissue, unspecified: Secondary | ICD-10-CM | POA: Diagnosis not present

## 2023-12-11 DIAGNOSIS — E1151 Type 2 diabetes mellitus with diabetic peripheral angiopathy without gangrene: Secondary | ICD-10-CM | POA: Diagnosis not present

## 2023-12-11 DIAGNOSIS — F419 Anxiety disorder, unspecified: Secondary | ICD-10-CM | POA: Diagnosis not present

## 2023-12-11 DIAGNOSIS — L89323 Pressure ulcer of left buttock, stage 3: Secondary | ICD-10-CM | POA: Diagnosis not present

## 2023-12-11 DIAGNOSIS — Z6827 Body mass index (BMI) 27.0-27.9, adult: Secondary | ICD-10-CM | POA: Diagnosis not present

## 2023-12-14 ENCOUNTER — Ambulatory Visit: Admitting: Orthopaedic Surgery

## 2023-12-14 DIAGNOSIS — I10 Essential (primary) hypertension: Secondary | ICD-10-CM | POA: Diagnosis not present

## 2023-12-14 DIAGNOSIS — E119 Type 2 diabetes mellitus without complications: Secondary | ICD-10-CM | POA: Diagnosis not present

## 2023-12-15 DIAGNOSIS — C439 Malignant melanoma of skin, unspecified: Secondary | ICD-10-CM | POA: Diagnosis not present

## 2023-12-15 DIAGNOSIS — L89153 Pressure ulcer of sacral region, stage 3: Secondary | ICD-10-CM | POA: Diagnosis not present

## 2023-12-15 DIAGNOSIS — C4371 Malignant melanoma of right lower limb, including hip: Secondary | ICD-10-CM | POA: Diagnosis not present

## 2023-12-15 DIAGNOSIS — L97119 Non-pressure chronic ulcer of right thigh with unspecified severity: Secondary | ICD-10-CM | POA: Diagnosis not present

## 2023-12-17 DIAGNOSIS — M80052D Age-related osteoporosis with current pathological fracture, left femur, subsequent encounter for fracture with routine healing: Secondary | ICD-10-CM | POA: Diagnosis not present

## 2023-12-17 DIAGNOSIS — I2510 Atherosclerotic heart disease of native coronary artery without angina pectoris: Secondary | ICD-10-CM | POA: Diagnosis not present

## 2023-12-17 DIAGNOSIS — F419 Anxiety disorder, unspecified: Secondary | ICD-10-CM | POA: Diagnosis not present

## 2023-12-17 DIAGNOSIS — E1151 Type 2 diabetes mellitus with diabetic peripheral angiopathy without gangrene: Secondary | ICD-10-CM | POA: Diagnosis not present

## 2023-12-17 DIAGNOSIS — L89323 Pressure ulcer of left buttock, stage 3: Secondary | ICD-10-CM | POA: Diagnosis not present

## 2023-12-17 DIAGNOSIS — E039 Hypothyroidism, unspecified: Secondary | ICD-10-CM | POA: Diagnosis not present

## 2023-12-17 DIAGNOSIS — I69828 Other speech and language deficits following other cerebrovascular disease: Secondary | ICD-10-CM | POA: Diagnosis not present

## 2023-12-17 DIAGNOSIS — E785 Hyperlipidemia, unspecified: Secondary | ICD-10-CM | POA: Diagnosis not present

## 2023-12-17 DIAGNOSIS — E669 Obesity, unspecified: Secondary | ICD-10-CM | POA: Diagnosis not present

## 2023-12-17 DIAGNOSIS — Z87891 Personal history of nicotine dependence: Secondary | ICD-10-CM | POA: Diagnosis not present

## 2023-12-17 DIAGNOSIS — L989 Disorder of the skin and subcutaneous tissue, unspecified: Secondary | ICD-10-CM | POA: Diagnosis not present

## 2023-12-17 DIAGNOSIS — I10 Essential (primary) hypertension: Secondary | ICD-10-CM | POA: Diagnosis not present

## 2023-12-17 DIAGNOSIS — F32A Depression, unspecified: Secondary | ICD-10-CM | POA: Diagnosis not present

## 2023-12-17 DIAGNOSIS — Z6827 Body mass index (BMI) 27.0-27.9, adult: Secondary | ICD-10-CM | POA: Diagnosis not present

## 2023-12-17 DIAGNOSIS — Z9181 History of falling: Secondary | ICD-10-CM | POA: Diagnosis not present

## 2023-12-17 DIAGNOSIS — Z7982 Long term (current) use of aspirin: Secondary | ICD-10-CM | POA: Diagnosis not present

## 2023-12-18 DIAGNOSIS — I69828 Other speech and language deficits following other cerebrovascular disease: Secondary | ICD-10-CM | POA: Diagnosis not present

## 2023-12-18 DIAGNOSIS — E785 Hyperlipidemia, unspecified: Secondary | ICD-10-CM | POA: Diagnosis not present

## 2023-12-18 DIAGNOSIS — E1151 Type 2 diabetes mellitus with diabetic peripheral angiopathy without gangrene: Secondary | ICD-10-CM | POA: Diagnosis not present

## 2023-12-18 DIAGNOSIS — Z87891 Personal history of nicotine dependence: Secondary | ICD-10-CM | POA: Diagnosis not present

## 2023-12-18 DIAGNOSIS — L989 Disorder of the skin and subcutaneous tissue, unspecified: Secondary | ICD-10-CM | POA: Diagnosis not present

## 2023-12-18 DIAGNOSIS — E669 Obesity, unspecified: Secondary | ICD-10-CM | POA: Diagnosis not present

## 2023-12-18 DIAGNOSIS — Z7982 Long term (current) use of aspirin: Secondary | ICD-10-CM | POA: Diagnosis not present

## 2023-12-18 DIAGNOSIS — L89323 Pressure ulcer of left buttock, stage 3: Secondary | ICD-10-CM | POA: Diagnosis not present

## 2023-12-18 DIAGNOSIS — F419 Anxiety disorder, unspecified: Secondary | ICD-10-CM | POA: Diagnosis not present

## 2023-12-18 DIAGNOSIS — I10 Essential (primary) hypertension: Secondary | ICD-10-CM | POA: Diagnosis not present

## 2023-12-18 DIAGNOSIS — F32A Depression, unspecified: Secondary | ICD-10-CM | POA: Diagnosis not present

## 2023-12-18 DIAGNOSIS — E039 Hypothyroidism, unspecified: Secondary | ICD-10-CM | POA: Diagnosis not present

## 2023-12-18 DIAGNOSIS — Z9181 History of falling: Secondary | ICD-10-CM | POA: Diagnosis not present

## 2023-12-18 DIAGNOSIS — Z6827 Body mass index (BMI) 27.0-27.9, adult: Secondary | ICD-10-CM | POA: Diagnosis not present

## 2023-12-18 DIAGNOSIS — I2510 Atherosclerotic heart disease of native coronary artery without angina pectoris: Secondary | ICD-10-CM | POA: Diagnosis not present

## 2023-12-18 DIAGNOSIS — M80052D Age-related osteoporosis with current pathological fracture, left femur, subsequent encounter for fracture with routine healing: Secondary | ICD-10-CM | POA: Diagnosis not present

## 2023-12-21 ENCOUNTER — Ambulatory Visit (HOSPITAL_COMMUNITY)

## 2023-12-21 ENCOUNTER — Ambulatory Visit

## 2023-12-21 ENCOUNTER — Telehealth: Payer: Self-pay | Admitting: *Deleted

## 2023-12-21 NOTE — Telephone Encounter (Signed)
 Left voicemail for pt to call back regarding referral to Oncology.

## 2023-12-28 ENCOUNTER — Encounter: Payer: Self-pay | Admitting: Nurse Practitioner

## 2023-12-28 ENCOUNTER — Inpatient Hospital Stay: Attending: Nurse Practitioner | Admitting: Nurse Practitioner

## 2023-12-28 ENCOUNTER — Encounter: Payer: Self-pay | Admitting: *Deleted

## 2023-12-28 ENCOUNTER — Other Ambulatory Visit: Payer: Self-pay | Admitting: *Deleted

## 2023-12-28 VITALS — BP 160/66 | HR 98 | Temp 97.7°F | Resp 18 | Ht 61.0 in | Wt 141.1 lb

## 2023-12-28 DIAGNOSIS — C4371 Malignant melanoma of right lower limb, including hip: Secondary | ICD-10-CM | POA: Insufficient documentation

## 2023-12-28 DIAGNOSIS — Z79899 Other long term (current) drug therapy: Secondary | ICD-10-CM

## 2023-12-28 DIAGNOSIS — Z720 Tobacco use: Secondary | ICD-10-CM

## 2023-12-28 DIAGNOSIS — F17210 Nicotine dependence, cigarettes, uncomplicated: Secondary | ICD-10-CM | POA: Insufficient documentation

## 2023-12-28 DIAGNOSIS — E039 Hypothyroidism, unspecified: Secondary | ICD-10-CM | POA: Insufficient documentation

## 2023-12-28 DIAGNOSIS — R590 Localized enlarged lymph nodes: Secondary | ICD-10-CM | POA: Insufficient documentation

## 2023-12-28 DIAGNOSIS — I10 Essential (primary) hypertension: Secondary | ICD-10-CM | POA: Diagnosis not present

## 2023-12-28 DIAGNOSIS — E119 Type 2 diabetes mellitus without complications: Secondary | ICD-10-CM

## 2023-12-28 DIAGNOSIS — F329 Major depressive disorder, single episode, unspecified: Secondary | ICD-10-CM

## 2023-12-28 DIAGNOSIS — C439 Malignant melanoma of skin, unspecified: Secondary | ICD-10-CM

## 2023-12-28 DIAGNOSIS — R599 Enlarged lymph nodes, unspecified: Secondary | ICD-10-CM

## 2023-12-28 DIAGNOSIS — I2510 Atherosclerotic heart disease of native coronary artery without angina pectoris: Secondary | ICD-10-CM

## 2023-12-28 NOTE — Progress Notes (Signed)
 Faxed request to [facility] at [phone] for BRAF testing on accession number [id number]

## 2023-12-28 NOTE — Progress Notes (Addendum)
 [facility]   Telephone:(336) [phone] Fax:(336) [phone]   Clinic New Consult Note   Patient Care Team: Clarice Nottingham, MD as PCP - General (Internal Medicine) 12/28/2023  CHIEF COMPLAINTS/PURPOSE OF CONSULTATION:  Melanoma, referred by [facility]  History of Present Illness   Nadene Witherspoon is a 78 year old female with PMH including CAD, HTN, PAD, TIA, DM, Bell's palsy, HL, recent left hip fracture and tobacco use is here because of melanoma.  She was hospitalized 11/23/2023 - 11/29/2023 for left hip fracture, imaging of a right thigh mass was documented during the admission and she was referred for outpatient biopsy. She underwent shave biopsy of the right anterior distal thigh 11/16/2023 showing melanoma Breslow thickness at least 2.1 mm with ulceration, peripheral and deep margins involved, no LVI or PNI this was staged pT3b NX MX.  She was seen by [facility] surgeon Dr. Feliciana 12/15/2023 who ordered staging workup and referred to med onc for immunotherapy with a plan to see her back in 3 months for surgical evaluation. She preferred treatment closer to home and was referred to us .   Social she is widowed with 1 daughter, lives independently in an apartment.  Daughter lives in the same complex. Retired, drives, does not do much exercise. Denies alcohol or drug use, currently smokes 1.5 packs/day x 50 years.  Up-to-date on mammogram and Cologuard.  Denies known family history of cancer.  Today she presents with her daughter noting a small growth on the right thigh that has progressed slowly over the past year and now weeps.  Denies pain or bleeding.  Appetite is normal with 5 pound weight fluctuation at baseline.  Energy is not great but she does not always sleep well.  Bowels moving normally, denies nausea/vomiting, abdominal or other pain.  She has a baseline cough, no chest pain or dyspnea.  Does not require oxygen. Ambulates well despite recent L hip fracture, denies pain. Has  intermittent headaches at baseline.      MEDICAL HISTORY:  Past Medical History:  Diagnosis Date   Anxiety    Complication of anesthesia 1990s   had trouble waking me up   Coronary artery disease, occlusive    cath 2007  - RCA 100%, Severe distal Circumflex   Daily headache    last couple weeks (07/09/2015)   Depression    Dyslipidemia    Hypertension    Hypothyroidism    Obesity (BMI 30.0-34.9)    PONV (postoperative nausea and vomiting)    PVD (peripheral vascular disease)    LAA Dopplers, November 2013: ABIs--0.67 right, 0.65 left bilateral common iliacs less than 50% stenosis, bilateral SFA demonstrate exclusive disease with reconstitution in the proximal segment of bronchial arteries.) He artery occluded with 2 vessel runoff on the right, 3 vessel run off on left.   Tobacco use    Type 2 diabetes mellitus (HCC)     SURGICAL HISTORY: Past Surgical History:  Procedure Laterality Date   CARDIAC CATHETERIZATION  03/06/2006   100% RCA, Severe distal Circumflex stenosis, moderate LAD; EF ~60% with mild anterior hypokinesis. (Dr. DOROTHA Gent)   CHOLECYSTECTOMY OPEN     DILATION AND CURETTAGE OF UTERUS     Lower Extremity Arterial Doppler  01/2012   bilateral ABI demonstrate moderate arterial insuff at rest; abd aorta with non-hemodynamically significant plaque; bilat CIA with equal/less than 50% diameter reduction; bilat SFA with occlusive disease & reconstiution in prox popliteal; R peroneal not visualized   Lower Extremity Arterial Doppler  September 2014  RABI: 0.55; LABI 0.53; moderate CEA, bilateral SFA-occlusive with reconstitution and proximal Popliteal; bilateral Peroneal artery occlusion.   NM MY[facility]IEW [facility]TD - Treadmill  02/2009   Walk 6 minutes. No ischemia or infarction.   TONSILLECTOMY      SOCIAL HISTORY: Social History   Socioeconomic History   Marital status: Widowed    Spouse name: Not on file   Number of children: 1   Years of education: 80   Highest  education level: Not on file  Occupational History    Employer: OTHER  Tobacco Use   Smoking status: Every Day    Current packs/day: 1.00    Average packs/day: 1 pack/day for 50.0 years (50.0 ttl pk-yrs)    Types: Cigarettes   Smokeless tobacco: Never  Vaping Use   Vaping status: Former  Substance and Sexual Activity   Alcohol use: No   Drug use: No   Sexual activity: Never  Other Topics Concern   Not on file  Social History Narrative   She is a widowed mother of one. She continues to smoke a pack a day, as she has for about 40 years. She is trying to pick up her exercise level but does not really do much with exercise. She says she is basically lazy   Social Drivers of Corporate investment banker Strain: Not on file  Food Insecurity: No Food Insecurity (12/28/2023)   Hunger Vital Sign    Worried About Running Out of Food in the Last Year: Never true    Ran Out of Food in the Last Year: Never true  Transportation Needs: Unknown (12/28/2023)   PRAPARE - Administrator, Civil Service (Medical): No    Lack of Transportation (Non-Medical): Not on file  Physical Activity: Not on file  Stress: Not on file  Social Connections: Socially Isolated (11/23/2023)   Social Connection and Isolation Panel    Frequency of Communication with Friends and Family: More than three times a week    Frequency of Social Gatherings with Friends and Family: More than three times a week    Attends Religious Services: Never    Database administrator or Organizations: No    Attends Banker Meetings: Never    Marital Status: Widowed  Intimate Partner Violence: Not At Risk (12/28/2023)   Humiliation, Afraid, Rape, and Kick questionnaire    Fear of Current or Ex-Partner: No    Emotionally Abused: No    Physically Abused: No    Sexually Abused: No    FAMILY HISTORY: Family History  Problem Relation Age of Onset   Heart attack Father 25   Heart failure Paternal Grandmother     Heart attack Paternal Grandfather    Heart attack Brother        CABG   Heart attack Sister     ALLERGIES:  is allergic to clindamycin/lincomycin, buspar [buspirone], crestor  [rosuvastatin  calcium ], demerol [meperidine], effexor [venlafaxine], erythromycin, levoxyl  [levothyroxine ], other, peach [prunus persica], penicillins, prednisone, sular [nisoldipine er], zithromax [azithromycin], and zoloft [sertraline hcl].  MEDICATIONS:  Current Outpatient Medications  Medication Sig Dispense Refill   acetaminophen  (TYLENOL ) 325 MG tablet Take 2 tablets (650 mg total) by mouth every 6 (six) hours as needed for mild pain (pain score 1-3) or fever (or Fever >/= 101).     ACTOS  15 MG tablet Take 15 mg by mouth every morning.     aspirin  81 MG tablet Take 1 tablet (81 mg total) by mouth 2 (two) times  daily with a meal. (Patient taking differently: Take 81 mg by mouth daily.) 30 tablet 1   calcium  carbonate (TUMS - DOSED IN MG ELEMENTAL CALCIUM ) 500 MG chewable tablet Chew 1 tablet by mouth daily as needed for indigestion or heartburn.     cholecalciferol  (VITAMIN D3) 25 MCG (1000 UNIT) tablet Take 1,000 Units by mouth daily. (Patient taking differently: Take 750 Units by mouth daily.)     levothyroxine  (SYNTHROID ) 100 MCG tablet Take 100 mcg by mouth daily before breakfast.     LORazepam  (ATIVAN ) 1 MG tablet Take 1 mg by mouth daily as needed for anxiety.     PAXIL  CR 25 MG 24 hr tablet Take 50 mg by mouth every evening.      valsartan (DIOVAN) 80 MG tablet Take 80 mg by mouth 2 (two) times daily.     ondansetron  (ZOFRAN ) 4 MG tablet Take 1 tablet (4 mg total) by mouth every 6 (six) hours as needed for nausea. (Patient not taking: Reported on 12/28/2023) 20 tablet 0   oxyCODONE  (OXY IR/ROXICODONE ) 5 MG immediate release tablet Take 1 tablet (5 mg total) by mouth every 6 (six) hours as needed for moderate pain (pain score 4-6). (Patient not taking: Reported on 12/28/2023) 20 tablet 0   No current  facility-administered medications for this visit.    REVIEW OF SYSTEMS:   Constitutional: Denies fevers, chills or abnormal night sweats Eyes: Denies blurriness of vision, double vision or watery eyes Ears, nose, mouth, throat, and face: Denies mucositis or sore throat Respiratory: Denies cough, dyspnea or wheezes (+) cough Cardiovascular: Denies palpitation, chest discomfort or lower extremity swelling (+) CAD (+) PAD Gastrointestinal:  Denies nausea, heartburn or change in bowel habits Skin: Denies abnormal skin rashes (+) large right thigh mass Lymphatics: Denies new lymphadenopathy or easy bruising Neurological:Denies numbness, tingling or new weaknesses (+) headaches  Behavioral/Psych: (+) Anxiety/depression  (+)mood is stable, no new changes  MSK: (+) L hip fracture All other systems were reviewed with the patient and are negative.  PHYSICAL EXAMINATION: ECOG PERFORMANCE STATUS: 1 - Symptomatic but completely ambulatory  Vitals:   12/28/23 0825 12/28/23 0826  BP: (!) 148/53 (!) 160/66  Pulse: 98   Resp: 18   Temp: 97.7 F (36.5 C)   SpO2: 97%    Filed Weights   12/28/23 0825  Weight: 141 lb 1.6 oz (64 kg)    GENERAL:alert, no distress and comfortable SKIN: skin color, texture, turgor are normal. Round large egg size mobile mass of the right anterior distal thigh with surrounding erythema. See image below EYES:  sclera clear NECK: without mass LYMPH:  no palpable cervical, supraclavicular, or axillary lymphadenopathy. Palpable right inguinal/femoral nodes LUNGS: clear with normal breathing effort HEART: regular rate & rhythm, murmur noted, erythema over the distal extremities, no lower extremity edema ABDOMEN: abdomen soft, non-tender and normal bowel sounds. No palpable hepatosplenomegaly or mass Musculoskeletal: no left hip ttp  PSYCH: alert & oriented x 3 with fluent speech NEURO: no focal motor/sensory deficits     LABORATORY DATA:  I have reviewed the data  as listed    Latest Ref Rng & Units 11/29/2023    3:30 AM 11/24/2023    3:18 AM 11/23/2023   12:07 AM  CBC  WBC 4.0 - 10.5 K/uL 8.8  8.9  9.1   Hemoglobin 12.0 - 15.0 g/dL 86.9  87.4  88.5   Hematocrit 36.0 - 46.0 % 39.1  37.8  34.4   Platelets 150 - 400 K/uL  333  314  301       Latest Ref Rng & Units 11/29/2023    3:30 AM 11/24/2023    3:18 AM 11/23/2023   12:07 AM  CMP  Glucose 70 - 99 mg/dL 83  93  885   BUN 8 - 23 mg/dL 19  13  13    Creatinine 0.44 - 1.00 mg/dL 9.17  9.19  9.22   Sodium 135 - 145 mmol/L 135  135  132   Potassium 3.5 - 5.1 mmol/L 5.3  4.7  4.1   Chloride 98 - 111 mmol/L 99  99  97   CO2 22 - 32 mmol/L 23  24  24    Calcium  8.9 - 10.3 mg/dL 9.9  9.7  9.9   Total Protein 6.5 - 8.1 g/dL 7.0  7.0    Total Bilirubin 0.0 - 1.2 mg/dL 0.4  0.3    Alkaline Phos 38 - 126 U/L 116  97    AST 15 - 41 U/L 19  14    ALT 0 - 44 U/L 13  7       RADIOGRAPHIC STUDIES: I have personally reviewed the radiological images as listed and agreed with the findings in the report. No results found.  ASSESSMENT & PLAN: 78 year old female   Malignant melanoma right anterior thigh, pT3B Nx Mx,  -Present for approximately 1 year, started seeing derm Dr. Dietrich 09/2023 -11/16/2023 Shave biopsy of the right anterior distal thigh 11/16/2023 + melanoma Breslow thickness at least 2.1 mm with ulceration, peripheral and deep margins involved, no LVI or PNI this was staged pT3b NX MX.  -IHC stains: panmel (+) SOX10 (+) CKAE1/3 (focally +) p63 (focally +) and CD10 (focally + in stroma) -12/15/2023 Seen by [facility] surgeon Dr. Feliciana who recommended CT CAP/brain MRI and referred to med onc for immunotherapy with a plan to see her back in 3 months for surgical evaluation. Pt requested care closer to home -Exam reveals mobile right distal thigh mass, clear drainage with surrounding erythema and palpable R inguinal/femoral nodes  2. Greater trochanter left hip fracture 11/23/2023 [place] at home while  maneuvering a recliner  -Ortho consult recommends nonoperative treatment with weightbearing as tolerated, follow-up with repeat imaging, PT/OT -Ambulating well with walker, bearing weight, no pain  3. CAD, PAD -Cath 2007 RCA 100%, severe distal circumflex -On aspirin   4. HTN -On Diovan  5. HL  6. DM -On Actos  -Admission BG's 98 - 120's recently  7. Depression -On Paxil  -Well-controlled   8. Hypothyroidism -On Synthroid   9. Tobacco use -1.5 ppd x50 years -Encouraged to quit, offered assistance -Not O2 dependent    Disposition Ms. Bacchi presents with melanoma of the distal right thigh and palpable inguinal lymph node, concerning for regional nodal involvement.  She is being referred for staging PET scan, brain MRI, and surgical evaluation with Dr. Aron.  Ms. Croston is open to surgery if she is a candidate.  Historically there has not been a significant role for neoadjuvant immunotherapy for melanoma, but newer studies are showing a benefit. If PET scan is negative for distant metastasis, we will discuss a neoadjuvant course of ipilimumab and nivolumab.    We plan to see her back 10/15 after staging scans.   Patient seen with Dr. Cloretta.     All questions were answered. The patient knows to call the clinic with any problems, questions or concerns.      Callia Swim K Haroon Shatto, NP 12/28/2023  This was a  shared visit with Armida Silversmith.  Mr. Rabinovich was interviewed and examined.  She has been diagnosed with melanoma involving a right thigh mass.  She appears to have a locally advanced melanoma.  She will be referred for a staging evaluation.  We will submit the biopsy tissue for BRAF testing.  We referred her to Dr. Aron to consider surgical options.  I contacted Dr. Aron.  We will most likely recommend neoadjuvant ipilimumab/nivolumab if the staging evaluation reveals no evidence of metastatic disease.  She will return for an office visit after the staging  studies.  Arvella Hof, MD

## 2023-12-28 NOTE — Progress Notes (Signed)
 PATIENT NAVIGATOR PROGRESS NOTE  Name: Katherine Martin Date: 12/28/2023 MRN: [id number]  DOB: 08-15-1945   Reason for visit:  New patient appt  Comments:  Met with Katherine Martin and her daughter Katherine Martin after visit with Dr Cloretta and Lacie Burton NP  Placed orders for PET scan and Brain MRI Given Journeys notebook with disease specific information  Referral to SW Referral to nutrition Referral to Dr Aron for surgical consult    Time spent counseling/coordinating care: > 60 minutes

## 2023-12-29 ENCOUNTER — Telehealth: Payer: Self-pay | Admitting: Oncology

## 2024-01-01 DIAGNOSIS — C439 Malignant melanoma of skin, unspecified: Secondary | ICD-10-CM | POA: Diagnosis not present

## 2024-01-03 ENCOUNTER — Ambulatory Visit: Admitting: Nurse Practitioner

## 2024-01-05 ENCOUNTER — Encounter (HOSPITAL_COMMUNITY)
Admission: RE | Admit: 2024-01-05 | Discharge: 2024-01-05 | Disposition: A | Source: Ambulatory Visit | Attending: Oncology | Admitting: Oncology

## 2024-01-05 ENCOUNTER — Other Ambulatory Visit: Payer: Self-pay | Admitting: Nurse Practitioner

## 2024-01-05 DIAGNOSIS — C439 Malignant melanoma of skin, unspecified: Secondary | ICD-10-CM | POA: Insufficient documentation

## 2024-01-05 DIAGNOSIS — R918 Other nonspecific abnormal finding of lung field: Secondary | ICD-10-CM | POA: Diagnosis not present

## 2024-01-05 LAB — GLUCOSE, CAPILLARY: Glucose-Capillary: 95 mg/dL (ref 70–99)

## 2024-01-05 MED ORDER — FLUDEOXYGLUCOSE F - 18 (FDG) INJECTION
6.9600 | Freq: Once | INTRAVENOUS | Status: AC
  Administered 2024-01-05: 6.96 via INTRAVENOUS

## 2024-01-06 ENCOUNTER — Other Ambulatory Visit (HOSPITAL_COMMUNITY)

## 2024-01-06 ENCOUNTER — Ambulatory Visit (HOSPITAL_COMMUNITY)
Admission: RE | Admit: 2024-01-06 | Discharge: 2024-01-06 | Disposition: A | Discharge status: Home / Self Care | Source: Ambulatory Visit | Attending: Oncology | Admitting: Oncology

## 2024-01-06 DIAGNOSIS — C439 Malignant melanoma of skin, unspecified: Secondary | ICD-10-CM

## 2024-01-06 MED ADMIN — Gadobutrol Inj 1 MMOL/ML (604.72 MG/ML): 7 mL | INTRAVENOUS | NDC 50419032511

## 2024-01-09 DIAGNOSIS — E119 Type 2 diabetes mellitus without complications: Secondary | ICD-10-CM | POA: Diagnosis not present

## 2024-01-09 DIAGNOSIS — R011 Cardiac murmur, unspecified: Secondary | ICD-10-CM | POA: Diagnosis not present

## 2024-01-09 DIAGNOSIS — I739 Peripheral vascular disease, unspecified: Secondary | ICD-10-CM | POA: Diagnosis not present

## 2024-01-09 DIAGNOSIS — C4371 Malignant melanoma of right lower limb, including hip: Secondary | ICD-10-CM | POA: Diagnosis not present

## 2024-01-09 NOTE — Progress Notes (Signed)
 REFERRING PHYSICIAN:  Clarice Ryan BIRCH, MD PROVIDER:  JINA CLAIR NEPHEW, MD MRN: [id number] DOB: October 19, 1945 DATE OF ENCOUNTER: 01/09/2024 Subjective    Chief Complaint: New Consultation   History of Present Illness: Katherine Martin is a 78 y.o. female who is seen today as an office consultation for evaluation of New Consultation   History of Present Illness Katherine Martin is a 78 year old female with melanoma who presents for surgical consultation regarding her melanoma. She is accompanied by her daughter. She was referred by Dr. Cloretta for evaluation of her melanoma and lymph node involvement.  She has had a large right lesion for over a year, with a lesion on her right thigh that has changed in appearance. The lesion is covered with a dressing and drains, occasionally bleeding if not kept moist with Vaseline. She manages the drainage with nonstick pads. A recent PET scan confirmed lymph node involvement.  Approximately seven weeks ago, she sustained a hip fracture on September 1st and did not undergo surgery. Initially hospitalized at [facility] for a week, she was then transferred to [facility] for two weeks of physical therapy. Now at home, she can bear weight on the hip but experiences difficulty and instability, particularly when showering. She uses a shower seat occasionally and is cautious about falls.  She experiences occasional ankle swelling but no significant leg swelling or pain elsewhere. She has a history of a heart murmur and is currently seeing a vascular surgeon for evaluation of her carotids. There is a family history of heart issues.     Pathology [facility]16[facility]2023[facility]  from [facility] Accession number [id number], collected 11/16/2023.   Review of Systems: A complete review of systems was obtained from the patient.  I have reviewed this information and discussed as appropriate with the patient.  See HPI as well for other ROS.  Review  of Systems  HENT:  Positive for tinnitus.   Gastrointestinal:  Positive for heartburn.  Neurological:  Positive for headaches.  Psychiatric/Behavioral:  The patient is nervous/anxious.   All other systems reviewed and are negative.    Medical History: Past Medical History:  Diagnosis Date  . Anxiety   . Hypertension   . Thyroid  disease     Patient Active Problem List  Diagnosis  . Displaced fracture of greater trochanter of left femur, initial encounter for closed fracture (CMS/HHS-HCC)  . PAD (peripheral artery disease)  . Type 2 diabetes mellitus without complications (CMS/HHS-HCC)  . Systolic murmur    Past Surgical History:  Procedure Laterality Date  . CHOLECYSTECTOMY       Allergies  Allergen Reactions  . Erythromycin Hives  . Meperidine Hives  . Penicillins Hives    Did it involve swelling of the face/tongue/throat, SOB, or low BP? No Did it involve sudden or severe rash/hives, skin peeling, or any reaction on the inside of your mouth or nose? Yes Did you need to seek medical attention at a hospital or doctor's office? Yes When did it last happen?      a long time ago If all above answers are NO, may proceed with cephalosporin use.  Did it involve swelling of the face/tongue/throat, SOB, or low BP? No  Did it involve sudden or severe rash/hives, skin peeling, or any reaction on the inside of your mouth or nose? Yes  Did you need to seek medical attention at a hospital or doctor's office? Yes  When did it last happen?  a long time ago  If all above answers are NO, may proceed with cephalosporin use.  . Prednisone Hives    Current Outpatient Medications on File Prior to Visit  Medication Sig Dispense Refill  . aspirin  81 mg tablet Take 81 mg by mouth    . cholecalciferol  (VITAMIN D3) 1000 unit tablet Take 1,000 Units by mouth once daily    . levothyroxine  (SYNTHROID ) 100 MCG tablet Take 100 mcg by mouth every morning before breakfast (0630)    . LORazepam   (ATIVAN ) 1 MG tablet Take 1 mg by mouth    . ondansetron  (ZOFRAN ) 4 MG tablet Take 4 mg by mouth every 6 (six) hours as needed for Nausea    . oxyCODONE  (ROXICODONE ) 5 MG immediate release tablet Take 5 mg by mouth    . PAXIL  CR 25 mg CR tablet Take 50 mg by mouth every morning    . pioglitazone  (ACTOS ) 15 MG tablet Take 15 mg by mouth once daily    . valsartan (DIOVAN) 80 MG tablet Take 80 mg by mouth 2 (two) times daily     No current facility-administered medications on file prior to visit.    Family History  Problem Relation Age of Onset  . Heart valve disease Father   . High blood pressure (Hypertension) Sister   . Hyperlipidemia (Elevated cholesterol) Sister   . Heart valve disease Sister   . Diabetes Sister   . High blood pressure (Hypertension) Brother   . Heart valve disease Brother      Social History   Tobacco Use  Smoking Status Every Day  . Types: Cigarettes  Smokeless Tobacco Never     Social History   Socioeconomic History  . Marital status: Widowed  Tobacco Use  . Smoking status: Every Day    Types: Cigarettes  . Smokeless tobacco: Never  Vaping Use  . Vaping status: Never Used  Substance and Sexual Activity  . Alcohol use: Never  . Drug use: Never   Social Drivers of Health   Food Insecurity: No Food Insecurity (12/28/2023)   [facility]   Hunger Vital Sign   . Within the past 12 months, you worried that your food would run out before you got the money to buy more.: Never true   . Within the past 12 months, the food you bought just didn't last and you didn't have money to get more.: Never true  Transportation Needs: Unknown (12/28/2023)   Received from [facility] - Transportation   . In the past 12 months, has lack of transportation kept you from medical appointments or from getting medications?: No  Social Connections: Socially Isolated (11/23/2023)   Received from [facility]   Social Connection and Isolation Panel   .  In a typical week, how many times do you talk on the phone with family, friends, or neighbors?: More than three times a week   . How often do you get together with friends or relatives?: More than three times a week   . How often do you attend church or religious services?: Never   . Do you belong to any clubs or organizations such as church groups, unions, fraternal or athletic groups, or school groups?: No   . How often do you attend meetings of the clubs or organizations you belong to?: Never   . Are you married, widowed, divorced, separated, never married, or living with a partner?: Widowed  Housing Stability: Unknown (01/09/2024)  Housing Stability Vital Sign   . Homeless in the Last Year: No    Objective:   Vitals:   01/09/24 0940 01/09/24 0941  BP: (!) 146/69   Pulse: 93   Temp: 36.4 C (97.6 F)   SpO2: 94%   Weight: 64.2 kg (141 lb 9.6 oz)   Height: 154.9 cm (5' 1)   PainSc:  0-No pain    Body mass index is 26.76 kg/m.   Head:  Normocephalic and atraumatic.  Mouth/Throat: Oropharynx is clear and moist. No oropharyngeal exudate.  Eyes:  Conjunctivae are normal. Pupils are equal, round, and reactive to light. No scleral icterus.  Neck:  Normal range of motion. Neck supple. No tracheal deviation present. No thyromegaly present.  Cardiovascular: Normal rate, regular rhythm, + systolic murmur,  and intact distal pulses.  Exam reveals no gallop and no friction rub.   Respiratory: Effort normal and breath sounds normal. No respiratory distress. No wheezes, rales or rhonchi.  No chest wall tenderness.  GI:  Soft. Bowel sounds are normal. Abdomen is soft, non tender, non distended.  No masses or hepatosplenomegaly is present. There is no rebound and no guarding.  Musculoskeletal: . Extremities are non tender and without deformity.  Lymphadenopathy:   No cervical or axillary adenopathy.  Multiple palpable right inguinal lymph nodes. Neurological: Alert and oriented to person,  place, and time. Coordination normal.  Skin:  Skin is warm and dry. No rash noted. No diaphoresis. No erythema. No pallor.  See below Psychiatric: Normal mood and affect.Behavior is normal. Judgment and thought content normal.     Labs, Imaging and Diagnostic Testing: 11/29/2023 CBC normal CMET normal other than K 5.3  Assessment and Plan:     Diagnoses and all orders for this visit:  PAD (peripheral artery disease)  Type 2 diabetes mellitus without complication, without long-term current use of insulin  (CMS/HHS-HCC)  Systolic murmur -     Ambulatory Referral to Cardiology  Displaced fracture of greater trochanter of left femur, initial encounter for closed fracture (CMS/HHS-HCC)  Malignant melanoma of right lower extremity including hip (CMS/HHS-HCC) -     Ambulatory Referral to Cardiology    Assessment & Plan Right thigh melanoma with regional lymph node involvement Chronic melanoma with significant growth and lymph node involvement. PET scan shows groin lymph node involvement and indeterminate pulmonary nodules. - Proceed with immunotherapy with Dr. Cloretta  - Consider port placement for IV administration if needed. - Repeat PET scan post-immunotherapy to assess response. - Plan wide local excision of melanoma and lymph nodes post-immunotherapy. (Right inguinal LND)  Hip fracture Managed non-operatively due to risk-benefit analysis. Weight-bearing and ambulation with tolerable discomfort. - Continue weight-bearing as tolerated. - Ensure home safety measures to prevent falls, such as using a shower seat. Continue working on mobility and strength.  Heart murmur Newly identified murmur, possibly valve-related. No chest pain or significant swelling, indicating good cardiac function. - Refer to cardiology for evaluation. - Consider echocardiogram to assess heart valves and function.     JINA CLAIR NEPHEW, MD

## 2024-01-10 ENCOUNTER — Inpatient Hospital Stay: Admitting: Oncology

## 2024-01-10 ENCOUNTER — Inpatient Hospital Stay

## 2024-01-10 ENCOUNTER — Other Ambulatory Visit: Payer: Self-pay | Admitting: *Deleted

## 2024-01-10 VITALS — BP 133/69 | HR 87 | Temp 97.8°F | Resp 18 | Ht 61.0 in | Wt 142.7 lb

## 2024-01-10 DIAGNOSIS — C439 Malignant melanoma of skin, unspecified: Secondary | ICD-10-CM | POA: Diagnosis not present

## 2024-01-10 DIAGNOSIS — F17210 Nicotine dependence, cigarettes, uncomplicated: Secondary | ICD-10-CM | POA: Diagnosis not present

## 2024-01-10 DIAGNOSIS — C4371 Malignant melanoma of right lower limb, including hip: Secondary | ICD-10-CM | POA: Diagnosis not present

## 2024-01-10 DIAGNOSIS — E039 Hypothyroidism, unspecified: Secondary | ICD-10-CM | POA: Diagnosis not present

## 2024-01-10 DIAGNOSIS — R590 Localized enlarged lymph nodes: Secondary | ICD-10-CM | POA: Diagnosis not present

## 2024-01-10 LAB — CBC WITH DIFFERENTIAL (CANCER CENTER ONLY)
Abs Immature Granulocytes: 0.07 K/uL (ref 0.00–0.07)
Basophils Absolute: 0 K/uL (ref 0.0–0.1)
Basophils Relative: 0 %
Eosinophils Absolute: 0.3 K/uL (ref 0.0–0.5)
Eosinophils Relative: 3 %
HCT: 32.8 % — ABNORMAL LOW (ref 36.0–46.0)
Hemoglobin: 10.8 g/dL — ABNORMAL LOW (ref 12.0–15.0)
Immature Granulocytes: 1 %
Lymphocytes Relative: 18 %
Lymphs Abs: 1.9 K/uL (ref 0.7–4.0)
MCH: 30.9 pg (ref 26.0–34.0)
MCHC: 32.9 g/dL (ref 30.0–36.0)
MCV: 94 fL (ref 80.0–100.0)
Monocytes Absolute: 0.8 K/uL (ref 0.1–1.0)
Monocytes Relative: 8 %
Neutro Abs: 7.2 K/uL (ref 1.7–7.7)
Neutrophils Relative %: 70 %
Platelet Count: 377 K/uL (ref 150–400)
RBC: 3.49 MIL/uL — ABNORMAL LOW (ref 3.87–5.11)
RDW: 14 % (ref 11.5–15.5)
WBC Count: 10.2 K/uL (ref 4.0–10.5)
nRBC: 0 % (ref 0.0–0.2)

## 2024-01-10 LAB — CMP (CANCER CENTER ONLY)
ALT: 7 U/L (ref 0–44)
AST: 13 U/L — ABNORMAL LOW (ref 15–41)
Albumin: 3.9 g/dL (ref 3.5–5.0)
Alkaline Phosphatase: 101 U/L (ref 38–126)
Anion gap: 13 (ref 5–15)
BUN: 9 mg/dL (ref 8–23)
CO2: 26 mmol/L (ref 22–32)
Calcium: 10 mg/dL (ref 8.9–10.3)
Chloride: 96 mmol/L — ABNORMAL LOW (ref 98–111)
Creatinine: 0.8 mg/dL (ref 0.44–1.00)
GFR, Estimated: >60 mL/min (ref >60)
Glucose, Bld: 98 mg/dL (ref 70–99)
Potassium: 4.1 mmol/L (ref 3.5–5.1)
Sodium: 134 mmol/L — ABNORMAL LOW (ref 135–145)
Total Bilirubin: 0.2 mg/dL (ref 0.0–1.2)
Total Protein: 7.7 g/dL (ref 6.5–8.1)

## 2024-01-10 LAB — LACTATE DEHYDROGENASE: LDH: 186 U/L (ref 98–192)

## 2024-01-10 LAB — TSH: TSH: 1.22 u[IU]/mL (ref 0.350–4.500)

## 2024-01-10 NOTE — Progress Notes (Signed)
 START ON PATHWAY REGIMEN - Melanoma and Other Skin Cancers     Cycles 1 through 4: A cycle is every 21 days:     Nivolumab      Ipilimumab    Cycles 5 and beyond: A cycle is every 28 days:     Nivolumab   **Always confirm dose/schedule in your pharmacy ordering system**  Patient Characteristics: Melanoma, Cutaneous/Unknown Primary, Local/In Transit/Nodal Recurrence, Unresectable, Systemic Therapy Indicated, BRAF V600 Wild Type / BRAF V600 Results Pending or Unknown Disease Classification: Melanoma Disease Subtype: Cutaneous BRAF V600 Mutation Status: BRAF V600 Wild Type (No Mutation) Therapeutic Status: Local/In Transit/Nodal Recurrence Type of Therapy: Systemic Therapy Indicated Intent of Therapy: Curative Intent, Discussed with Patient

## 2024-01-10 NOTE — Progress Notes (Signed)
 PATIENT NAVIGATOR PROGRESS NOTE  Name: Katherine Martin Date: 01/10/2024 MRN: [id number]  DOB: 03/09/1946   Reason for visit:  F/U visit and education  Comments:  See education note     Time spent counseling/coordinating care: > 60 minutes

## 2024-01-10 NOTE — Progress Notes (Signed)
 [facility] OFFICE PROGRESS NOTE   Diagnosis: Melanoma  INTERVAL HISTORY:   Katherine Martin returns as scheduled.  The right thigh mass is draining .  No new complaint.  She is here today with her daughter.  She saw Dr. Aron 01/09/2024.  Dr. Adine recommends neoadjuvant therapy.  Objective:  Vital signs in last 24 hours:  Blood pressure 133/69, pulse 87, temperature 97.8 F (36.6 C), temperature source Temporal, resp. rate 18, height 5' 1 (1.549 m), weight 142 lb 11.2 oz (64.7 kg), SpO2 100%.    Lymphatics: No cervical, supraclavicular, axillary, or left inguinal nodes.  Firm right femoral/inguinal nodes measuring 2 to 3 cm Resp: Clear bilaterally Cardio: Regular rate and rhythm GI: No hepatosplenomegaly Vascular: No leg edema  Skin: 5-6 cm exophytic mass at the right anterior thigh with surrounding erythema  Lab Results:  Lab Results  Component Value Date   WBC 8.8 11/29/2023   HGB 13.0 11/29/2023   HCT 39.1 11/29/2023   MCV 95.6 11/29/2023   PLT 333 11/29/2023   NEUTROABS 6.9 11/23/2023    CMP  Lab Results  Component Value Date   NA 135 11/29/2023   K 5.3 (H) 11/29/2023   CL 99 11/29/2023   CO2 23 11/29/2023   GLUCOSE 83 11/29/2023   BUN 19 11/29/2023   CREATININE 0.82 11/29/2023   CALCIUM  9.9 11/29/2023   PROT 7.0 11/29/2023   ALBUMIN 3.8 11/29/2023   AST 19 11/29/2023   ALT 13 11/29/2023   ALKPHOS 116 11/29/2023   BILITOT 0.4 11/29/2023   GFRNONAA >60 11/29/2023   GFRAA >60 06/02/2019    No results found for: CEA1, CEA, CAN199, CA125  No results found for: INR, LABPROT  Imaging:  MR Brain W Wo Contrast Result Date: 01/09/2024 CLINICAL DATA:  Melanoma EXAM: MRI HEAD WITHOUT AND WITH CONTRAST TECHNIQUE: Multiplanar, multiecho pulse sequences of the brain and surrounding structures were obtained without and with intravenous contrast. CONTRAST:  7mL GADAVIST GADOBUTROL 1 MMOL/ML IV SOLN COMPARISON:  May 18, 2014  FINDINGS: MRI brain: There are several foci of T2 hyperintensity in the cerebral white matter. These do not have restricted diffusion. No abnormal enhancement. There is no acute or chronic infarct. The ventricles are normal. No mass lesion. There are normal flow signals in the carotid arteries and basilar artery. No significant bone marrow signal abnormality. No significant abnormality in the paranasal sinuses or soft tissues. IMPRESSION: No evidence of metastatic disease Electronically Signed   By: Nancyann Burns M.D.   On: 01/09/2024 10:33    Medications: I have reviewed the patient's current medications.   Assessment/Plan:  Malignant melanoma right anterior thigh, stage III (pT3B cN3b,cM0) -Present for approximately 1 year, started seeing derm Dr. Dietrich 09/2023 -11/16/2023 Shave biopsy of the right anterior distal thigh 11/16/2023 + melanoma Breslow thickness at least 2.1 mm with ulceration, peripheral and deep margins involved, no LVI or PNI this was staged pT3b NX MX.  -IHC stains: panmel (+) SOX10 (+) CKAE1/3 (focally +) p63 (focally +) and CD10 (focally + in stroma), BRAF negative -12/15/2023 Seen by [facility] surgeon Dr. Feliciana who recommended CT CAP/brain MRI and referred to med onc for immunotherapy with a plan to see her back in 3 months for surgical evaluation. Pt requested care closer to home -Exam reveals mobile right distal thigh mass, clear drainage with surrounding erythema and palpable R inguinal/femoral nodes - 01/05/2024 PET: FDG avid right lower thigh mass, multiple metastatic right inguinal, right external iliac, and right common  iliac nodes, FDG avid nondisplaced left greater trochanter fracture - 01/06/2024 MRI brain: No evidence of metastatic disease  2. Greater trochanter left hip fracture 11/23/2023 [place] at home while maneuvering a recliner  -Ortho consult recommends nonoperative treatment with weightbearing as tolerated, follow-up with repeat imaging, PT/OT -Ambulating  well with walker, bearing weight, no pain  3. CAD, PAD -Cath 2007 RCA 100%, severe distal circumflex -On aspirin   4. HTN -On Diovan  5. HL  6. DM -On Actos  -Admission BG's 98 - 120's recently  7. Depression -On Paxil  -Well-controlled   8. Hypothyroidism -On Synthroid   9. Tobacco use -1.5 ppd x50 years -Encouraged to quit, offered assistance -Not O2 dependent     Disposition: Katherine Martin has been diagnosed with locally advanced melanoma.  There is a dominant right anterior thigh mass and metastatic right inguinal/pelvic lymph nodes.  There is no evidence of distant metastatic disease.  The left trochanter fracture is likely a benign fracture secondary to trauma.  I discussed treatment options with Katherine Martin and her daughter.  She appears to be a candidate for immunotherapy with ipilimumab/nivolumab.  I recommend ipilimumab/nivolumab every 3 weeks for at least 2 cycles prior to surgical resection.  Adjuvant therapy will be based on the response to the immunotherapy.  We reviewed potential toxicities associated with the ipilimumab/nivolumab regimen including the chance of allergic reaction, rash, diarrhea, and various autoimmune toxicities.  This included a discussion of the chance of hypothyroidism, cardiac toxicity, lung toxicity, hepatic toxicity, CNS toxicity, nephrotoxicity, and neuropathy.  She agrees to proceed.  She will attend a chemotherapy treatment class.  The plan is to initiate ipilimumab/nivolumab on 01/19/2024.  I will coordinate the plan for restaging and surgery with Dr. Aron.  A treatment plan was entered today.  Arley Hof, MD  01/10/2024  11:18 AM

## 2024-01-11 ENCOUNTER — Encounter: Payer: Self-pay | Admitting: *Deleted

## 2024-01-11 ENCOUNTER — Inpatient Hospital Stay

## 2024-01-11 ENCOUNTER — Telehealth: Payer: Self-pay | Admitting: Oncology

## 2024-01-11 ENCOUNTER — Other Ambulatory Visit: Payer: Self-pay

## 2024-01-11 ENCOUNTER — Telehealth: Payer: Self-pay

## 2024-01-11 DIAGNOSIS — E032 Hypothyroidism due to medicaments and other exogenous substances: Secondary | ICD-10-CM | POA: Diagnosis not present

## 2024-01-11 LAB — T4: T4, Total: 9 ug/dL (ref 4.5–12.0)

## 2024-01-11 NOTE — Progress Notes (Signed)
 Per discussion with patient this am, she would like to start treatment on 11/6 rather than 10/31. Dr Cloretta informed and appt scheduled

## 2024-01-11 NOTE — Telephone Encounter (Signed)
 [facility] Clinical Social Work  Clinical Social Work was referred by Statistician for distress screen needs and assessment of psychosocial needs.  Clinical Social Worker attempted to contact patient by phone to offer support and assess for needs.  CSW left detailed message for patient. Patient scheduled to see CSW on /10/28. Patient encouraged to call back if appointment needs to be adjusted.    Lizbeth Sprague, LCSW  Clinical Social Worker [facility]

## 2024-01-11 NOTE — Telephone Encounter (Signed)
 Left detailed message on PT voicemail.

## 2024-01-11 NOTE — Telephone Encounter (Signed)
 Per RN on 10/23 labs not required for 11/6 appt. PT called to cancel txt on 10/31, would like to start on 11/6.

## 2024-01-12 ENCOUNTER — Other Ambulatory Visit: Payer: Self-pay | Admitting: *Deleted

## 2024-01-12 ENCOUNTER — Other Ambulatory Visit: Payer: Self-pay

## 2024-01-12 DIAGNOSIS — Z122 Encounter for screening for malignant neoplasm of respiratory organs: Secondary | ICD-10-CM

## 2024-01-12 DIAGNOSIS — Z87891 Personal history of nicotine dependence: Secondary | ICD-10-CM

## 2024-01-12 DIAGNOSIS — F17210 Nicotine dependence, cigarettes, uncomplicated: Secondary | ICD-10-CM

## 2024-01-16 ENCOUNTER — Encounter: Payer: Self-pay | Admitting: Oncology

## 2024-01-16 ENCOUNTER — Other Ambulatory Visit: Payer: Self-pay | Admitting: Oncology

## 2024-01-16 ENCOUNTER — Inpatient Hospital Stay

## 2024-01-16 NOTE — Progress Notes (Signed)
 Pharmacist Chemotherapy Monitoring - Initial Assessment    Anticipated start date: 01/25/24   The following has been reviewed per standard work regarding the patient's treatment regimen: The patient's diagnosis, treatment plan and drug doses, and organ/hematologic function Lab orders and baseline tests specific to treatment regimen  The treatment plan start date, drug sequencing, and pre-medications Prior authorization status  Patient's documented medication list, including drug-drug interaction screen and prescriptions for anti-emetics and supportive care specific to the treatment regimen The drug concentrations, fluid compatibility, administration routes, and timing of the medications to be used The patient's access for treatment and lifetime cumulative dose history, if applicable  The patient's medication allergies and previous infusion related reactions, if applicable   Changes made to treatment plan:  treatment plan date  Follow up needed:  N/A., f/u appmts switch to 11/26   Katherine Martin, [place], 01/16/2024  4:10 PM

## 2024-01-16 NOTE — Progress Notes (Signed)
 [facility] Clinical Social Work  Initial Assessment   Katherine Martin is a 78 y.o. year old female contacted by phone. Clinical Social Work was referred by nurse navigator for assessment of psychosocial needs.   SDOH (Social Determinants of Health) assessments performed: Yes SDOH Interventions    Flowsheet Row Office Visit from 12/28/2023 in [facility] [place]. [facility]  SDOH Interventions   Food Insecurity Interventions Intervention Not Indicated  Housing Interventions Intervention Not Indicated  Transportation Interventions Intervention Not Indicated  Utilities Interventions Intervention Not Indicated    SDOH Screenings   Food Insecurity: No Food Insecurity (01/16/2024)  Housing: Low Risk  (01/16/2024)  Transportation Needs: No Transportation Needs (01/16/2024)  Utilities: Not At Risk (12/28/2023)  Depression (PHQ2-9): Low Risk  (01/16/2024)  Financial Resource Strain: Low Risk  (01/16/2024)  Social Connections: Socially Isolated (11/23/2023)  Tobacco Use: High Risk (01/09/2024)   Received from [facility]    PHQ 2/9:    01/16/2024    2:45 PM 01/10/2024   11:17 AM 12/28/2023    9:11 AM  Depression screen PHQ 2/9  Decreased Interest 0 0 0  Down, Depressed, Hopeless 0 0 0  PHQ - 2 Score 0 0 0     Distress Screen completed: No    01/10/2024    1:00 PM  ONCBCN DISTRESS SCREENING  Screening Type Initial Screening  How much distress have you been experiencing in the past week? (0-10) 4  Social concerns type Relationship with children  Emotional concerns type Worry or anxiety;Fear  Physical Concerns Type  Pain;Fatigue;Loss or change of physical abilities      Family/Social Information:  Housing Arrangement: patient lives alone. However, her daughter lives close by her. Family members/support persons in your life? Family and Friends Transportation concerns: Patient relies on her daughter to transport her to appointments.  Patient feels current transportation arrangement is sustainable.   Employment: Retired .  Income source: Actor concerns: No Type of concern: None Food access concerns: no Religious or spiritual practice: Yes-  Advanced directives: No Services Currently in place:    Coping/ Adjustment to diagnosis: Patient understands treatment plan and what happens next? yes Concerns about diagnosis and/or treatment: Patient reported expected nerves about the effects of treatment.   Patient reported stressors: No reported stressors at this time.  Current coping skills/ strengths: Ability for insight , Active sense of humor , Average or above average intelligence , Capable of independent living , Communication skills , Financial means , General fund of knowledge , Motivation for treatment/growth , Religious Affiliation , and Supportive family/friends     SUMMARY: Current SDOH Barriers:  No SDOH barriers identified.   Clinical Social Work Clinical Goal(s):  No clinical social work goals at this time  Interventions: Discussed common feeling and emotions when being diagnosed with cancer, and the importance of support during treatment Informed patient of the support team roles and support services at [facility] Provided CSW contact information and encouraged patient to call with any questions or concerns Provided education and assistance to client regarding Advanced Directives.    Follow Up Plan: Patient will contact CSW with any support or resource needs and CSW will follow-up with patient by phone  Patient verbalizes understanding of plan: Yes  Lizbeth Sprague, LCSW Clinical Social Worker [facility]

## 2024-01-18 ENCOUNTER — Telehealth: Payer: Self-pay | Admitting: *Deleted

## 2024-01-18 ENCOUNTER — Inpatient Hospital Stay

## 2024-01-18 ENCOUNTER — Inpatient Hospital Stay: Admitting: Nurse Practitioner

## 2024-01-18 DIAGNOSIS — Z0000 Encounter for general adult medical examination without abnormal findings: Secondary | ICD-10-CM | POA: Diagnosis not present

## 2024-01-18 NOTE — Telephone Encounter (Signed)
 Called to request future appointments after 11/6 to be on a Thursday or Friday due to transportation. Scheduling message sent

## 2024-01-19 ENCOUNTER — Inpatient Hospital Stay

## 2024-01-19 ENCOUNTER — Inpatient Hospital Stay: Admitting: Nurse Practitioner

## 2024-01-22 ENCOUNTER — Encounter: Payer: Self-pay | Admitting: Radiology

## 2024-01-22 DIAGNOSIS — E1151 Type 2 diabetes mellitus with diabetic peripheral angiopathy without gangrene: Secondary | ICD-10-CM | POA: Diagnosis not present

## 2024-01-22 DIAGNOSIS — L89323 Pressure ulcer of left buttock, stage 3: Secondary | ICD-10-CM | POA: Diagnosis not present

## 2024-01-22 DIAGNOSIS — I10 Essential (primary) hypertension: Secondary | ICD-10-CM | POA: Diagnosis not present

## 2024-01-22 DIAGNOSIS — I2510 Atherosclerotic heart disease of native coronary artery without angina pectoris: Secondary | ICD-10-CM | POA: Diagnosis not present

## 2024-01-25 ENCOUNTER — Inpatient Hospital Stay: Attending: Nurse Practitioner

## 2024-01-25 ENCOUNTER — Inpatient Hospital Stay

## 2024-01-25 ENCOUNTER — Inpatient Hospital Stay: Admitting: Nurse Practitioner

## 2024-01-25 ENCOUNTER — Encounter: Payer: Self-pay | Admitting: Oncology

## 2024-01-25 VITALS — BP 128/48 | HR 82 | Temp 97.6°F | Resp 18 | Wt 143.1 lb

## 2024-01-25 DIAGNOSIS — C439 Malignant melanoma of skin, unspecified: Secondary | ICD-10-CM

## 2024-01-25 DIAGNOSIS — Z79620 Long term (current) use of immunosuppressive biologic: Secondary | ICD-10-CM | POA: Insufficient documentation

## 2024-01-25 DIAGNOSIS — Z5112 Encounter for antineoplastic immunotherapy: Secondary | ICD-10-CM | POA: Diagnosis not present

## 2024-01-25 DIAGNOSIS — C4371 Malignant melanoma of right lower limb, including hip: Secondary | ICD-10-CM | POA: Insufficient documentation

## 2024-01-25 MED ORDER — SODIUM CHLORIDE 0.9 % IV SOLN
240.0000 mg | Freq: Once | INTRAVENOUS | Status: AC
  Administered 2024-01-25: 240 mg via INTRAVENOUS
  Filled 2024-01-25: qty 24

## 2024-01-25 MED ORDER — DIPHENHYDRAMINE HCL 50 MG/ML IJ SOLN
25.0000 mg | Freq: Once | INTRAMUSCULAR | Status: AC
  Administered 2024-01-25: 25 mg via INTRAVENOUS
  Filled 2024-01-25: qty 1

## 2024-01-25 MED ORDER — SODIUM CHLORIDE 0.9 % IV SOLN: INTRAVENOUS | Status: DC

## 2024-01-25 MED ORDER — SODIUM CHLORIDE 0.9 % IV SOLN
80.0000 mg | Freq: Once | INTRAVENOUS | Status: AC
  Administered 2024-01-25: 80 mg via INTRAVENOUS
  Filled 2024-01-25: qty 16

## 2024-01-25 MED ORDER — FAMOTIDINE IN NACL 20-0.9 MG/50ML-% IV SOLN
20.0000 mg | Freq: Once | INTRAVENOUS | Status: AC
  Administered 2024-01-25: 20 mg via INTRAVENOUS
  Filled 2024-01-25: qty 50

## 2024-01-25 NOTE — Progress Notes (Signed)
 Ipilimumab (YERVOY) Patient Monitoring Assessment   Is the patient experiencing any of the following general symptoms?:  [ ] Difficulty performing normal activities [ ] Feeling sluggish or cold all the time [ ] Unusual weight gain [ ] Constant or unusual headaches [ ] Feeling dizzy or faint [ ] Changes in eyesight (blurry vision, double vision, or other vision problems) [ ] Changes in mood or behavior (ex: decreased sex drive, irritability, or forgetfulness) [ ] Starting new medications (ex: steroids, other medications that lower immune response) [x ]Patient is not experiencing any of the general symptoms above.   Gastrointestinal  Patient is having 0-2 bowel movements each day.  Is this different from baseline? [ ] Yes [ x]No Are your stools watery or do they have a foul smell? [ ] Yes [x ]No Have you seen blood in your stools? [ ] Yes [x ]No Are your stools dark, tarry, or sticky? [ ] Yes [x ]No Are you having pain or tenderness in your belly? [ ] Yes [x ]No  Skin Does your skin itch? [ ] Yes [x ]No Do you have a rash? [ ] Yes [ x]No Has your skin blistered and/or peeled? [ x]Yes (sunburn many years ago) [ ] No Do you have sores in your mouth? [ ] Yes [x ]No  Hepatic Has your urine been dark or tea colored? [ ] Yes [ x]No Have you noticed that your skin or the whites of your eyes are turning yellow? [ ] Yes [ x]No Are you bleeding or bruising more easily than normal? [ ] Yes [x ]No Are you nauseous and/or vomiting? [ ] Yes [ x]No Do you have pain on the right side of your stomach? [ ] Yes [x ]No  Neurologic  Are you having unusual weakness of legs, arms, or face? [ ] Yes [x ]No Are you having numbness or tingling in your hands or feet? [x ]Yes [ ] No  [name] CHRISTELLA Baller  Reviewed all steps of treatment before and during infusions. AVS reviewed with pt and her daughter prior to discharge.

## 2024-01-25 NOTE — Patient Instructions (Signed)
 [facility] - A DEPT OF [facility]  Discharge Instructions: Thank you for choosing [facility] to provide your oncology and hematology care.   If you have a lab appointment with the Cancer Center, please go directly to the Cancer Center and check in at the registration area.   Wear comfortable clothing and clothing appropriate for easy access to any Portacath or PICC line.   We strive to give you quality time with your provider. You may need to reschedule your appointment if you arrive late (15 or more minutes).  Arriving late affects you and other patients whose appointments are after yours.  Also, if you miss three or more appointments without notifying the office, you may be dismissed from the clinic at the provider's discretion.      For prescription refill requests, have your pharmacy contact our office and allow 72 hours for refills to be completed.    Today you received the following chemotherapy and/or immunotherapy agents: Opdivo, Yervoy.     To help prevent nausea and vomiting after your treatment, we encourage you to take your nausea medication as directed.  BELOW ARE SYMPTOMS THAT SHOULD BE REPORTED IMMEDIATELY: *FEVER GREATER THAN 100.4 F (38 C) OR HIGHER *CHILLS OR SWEATING *NAUSEA AND VOMITING THAT IS NOT CONTROLLED WITH YOUR NAUSEA MEDICATION *UNUSUAL SHORTNESS OF BREATH *UNUSUAL BRUISING OR BLEEDING *URINARY PROBLEMS (pain or burning when urinating, or frequent urination) *BOWEL PROBLEMS (unusual diarrhea, constipation, pain near the anus) TENDERNESS IN MOUTH AND THROAT WITH OR WITHOUT PRESENCE OF ULCERS (sore throat, sores in mouth, or a toothache) UNUSUAL RASH, SWELLING OR PAIN  UNUSUAL VAGINAL DISCHARGE OR ITCHING   Items with * indicate a potential emergency and should be followed up as soon as possible or go to the Emergency Department if any problems should occur.  Please show the CHEMOTHERAPY ALERT CARD or  IMMUNOTHERAPY ALERT CARD at check-in to the Emergency Department and triage nurse.  Should you have questions after your visit or need to cancel or reschedule your appointment, please contact [facility] - A DEPT OF [facility]  Dept: [phone]  and follow the prompts.  Office hours are 8:00 a.m. to 4:30 p.m. Monday - Friday. Please note that voicemails left after 4:00 p.m. may not be returned until the following business day.  We are closed weekends and major holidays. You have access to a nurse at all times for urgent questions. Please call the main number to the clinic Dept: [phone] and follow the prompts.   For any non-urgent questions, you may also contact your provider using MyChart. We now offer e-Visits for anyone 24 and older to request care online for non-urgent symptoms. For details visit [email or web address].   Also download the MyChart app! Go to the app store, search MyChart, open the app, select [facility], and log in with your MyChart username and password.  _______________________________________________  Nivolumab Injection What is this medication? NIVOLUMAB (nye VOL ue mab) treats some types of cancer. It works by helping your immune system slow or stop the spread of cancer cells. It is a monoclonal antibody. This medicine may be used for other purposes; ask your health care provider or pharmacist if you have questions. COMMON BRAND NAME(S): Opdivo What should I tell my care team before I take this medication? They need to know if you have any of these conditions: Allogeneic stem cell transplant (uses someone else's stem cells) Autoimmune diseases, such  as Crohn disease, ulcerative colitis, lupus History of chest radiation Nervous system problems, such as Guillain-Barre syndrome or myasthenia gravis Organ transplant An unusual or allergic reaction to nivolumab, other medications, foods, dyes, or preservatives Pregnant or trying  to get pregnant Breast-feeding How should I use this medication? This medication is infused into a vein. It is given in a hospital or clinic setting. A special MedGuide will be given to you before each treatment. Be sure to read this information carefully each time. Talk to your care team about the use of this medication in children. While it may be prescribed for children as young as 12 years for selected conditions, precautions do apply. Overdosage: If you think you have taken too much of this medicine contact a poison control center or emergency room at once. NOTE: This medicine is only for you. Do not share this medicine with others. What if I miss a dose? Keep appointments for follow-up doses. It is important not to miss your dose. Call your care team if you are unable to keep an appointment. What may interact with this medication? Interactions have not been studied. This list may not describe all possible interactions. Give your health care provider a list of all the medicines, herbs, non-prescription drugs, or dietary supplements you use. Also tell them if you smoke, drink alcohol, or use illegal drugs. Some items may interact with your medicine. What should I watch for while using this medication? Your condition will be monitored carefully while you are receiving this medication. You may need blood work while taking this medication. This medication may cause serious skin reactions. They can happen weeks to months after starting the medication. Contact your care team right away if you notice fevers or flu-like symptoms with a rash. The rash may be red or purple and then turn into blisters or peeling of the skin. You may also notice a red rash with swelling of the face, lips, or lymph nodes in your neck or under your arms. Tell your care team right away if you have any change in your eyesight. Talk to your care team if you are pregnant or think you might be pregnant. A negative pregnancy test  is required before starting this medication. A reliable form of contraception is recommended while taking this medication and for 5 months after the last dose. Talk to your care team about effective forms of contraception. Do not breast-feed while taking this medication and for 5 months after the last dose. What side effects may I notice from receiving this medication? Side effects that you should report to your care team as soon as possible: Allergic reactions--skin rash, itching, hives, swelling of the face, lips, tongue, or throat Dry cough, shortness of breath or trouble breathing Eye pain, redness, irritation, or discharge with blurry or decreased vision Heart muscle inflammation--unusual weakness or fatigue, shortness of breath, chest pain, fast or irregular heartbeat, dizziness, swelling of the ankles, feet, or hands Hormone gland problems--headache, sensitivity to light, unusual weakness or fatigue, dizziness, fast or irregular heartbeat, increased sensitivity to cold or heat, excessive sweating, constipation, hair loss, increased thirst or amount of urine, tremors or shaking, irritability Infusion reactions--chest pain, shortness of breath or trouble breathing, feeling faint or lightheaded Kidney injury (glomerulonephritis)--decrease in the amount of urine, red or dark brown urine, foamy or bubbly urine, swelling of the ankles, hands, or feet Liver injury--right upper belly pain, loss of appetite, nausea, light-colored stool, dark yellow or brown urine, yellowing skin or eyes, unusual  weakness or fatigue Pain, tingling, or numbness in the hands or feet, muscle weakness, change in vision, confusion or trouble speaking, loss of balance or coordination, trouble walking, seizures Rash, fever, and swollen lymph nodes Redness, blistering, peeling, or loosening of the skin, including inside the mouth Sudden or severe stomach pain, bloody diarrhea, fever, nausea, vomiting Side effects that usually  do not require medical attention (report these to your care team if they continue or are bothersome): Bone, joint, or muscle pain Diarrhea Fatigue Loss of appetite Nausea Skin rash This list may not describe all possible side effects. Call your doctor for medical advice about side effects. You may report side effects to FDA at 1-800-FDA-1088. Where should I keep my medication? This medication is given in a hospital or clinic. It will not be stored at home. NOTE: This sheet is a summary. It may not cover all possible information. If you have questions about this medicine, talk to your doctor, pharmacist, or health care provider.  2024 Elsevier/Gold Standard (2021-07-05 00:00:00) _________________________________________  Ipilimumab Injection What is this medication? IPILIMUMAB (IP i LIM ue mab) treats some types of cancer. It works by helping your immune system slow or stop the spread of cancer cells. It is a monoclonal antibody. This medicine may be used for other purposes; ask your health care provider or pharmacist if you have questions. COMMON BRAND NAME(S): YERVOY What should I tell my care team before I take this medication? They need to know if you have any of these conditions: Allogeneic stem cell transplant (uses someone else's stem cells) Autoimmune diseases, such as Crohn disease, ulcerative colitis, lupus Nervous system problems, such as Guillain-Barre syndrome or myasthenia gravis Organ transplant An unusual or allergic reaction to ipilimumab, other medications, foods, dyes, or preservatives Pregnant or trying to get pregnant Breast-feeding How should I use this medication? This medication is infused into a vein. It is given by your care team in a hospital or clinic setting. A special MedGuide will be given to you before each treatment. Be sure to read this information carefully each time. Talk to your care team about the use of this medication in children. While it may be  prescribed for children as young as 12 years for selected conditions, precautions do apply. Overdosage: If you think you have taken too much of this medicine contact a poison control center or emergency room at once. NOTE: This medicine is only for you. Do not share this medicine with others. What if I miss a dose? Keep appointments for follow-up doses. It is important not to miss your dose. Call your care team if you are unable to keep an appointment. What may interact with this medication? Interactions are not expected. This list may not describe all possible interactions. Give your health care provider a list of all the medicines, herbs, non-prescription drugs, or dietary supplements you use. Also tell them if you smoke, drink alcohol, or use illegal drugs. Some items may interact with your medicine. What should I watch for while using this medication? Your condition will be monitored carefully while you are receiving this medication. You may need blood work while taking this medication. This medication may cause serious skin reactions. They can happen weeks to months after starting the medication. Contact your care team right away if you notice fevers or flu-like symptoms with a rash. The rash may be red or purple and then turn into blisters or peeling of the skin. You may also notice a red rash with  swelling of the face, lips, or lymph nodes in your neck or under your arms. Tell your care team right away if you have any change in your eyesight. Talk to your care team if you may be pregnant. Serious birth defects can occur if you take this medication during pregnancy and for 3 months after the last dose. You will need a negative pregnancy test before starting this medication. Contraception is recommended while taking this medication and for 3 months after the last dose. Your care team can help you find the option that works for you. Do not breastfeed while taking this medication and for 3 months  after the last dose. What side effects may I notice from receiving this medication? Side effects that you should report to your care team as soon as possible: Allergic reactions--skin rash, itching, hives, swelling of the face, lips, tongue, or throat Dry cough, shortness of breath or trouble breathing Eye pain, redness, irritation, or discharge with blurry or decreased vision Heart muscle inflammation--unusual weakness or fatigue, shortness of breath, chest pain, fast or irregular heartbeat, dizziness, swelling of the ankles, feet, or hands Hormone gland problems--headache, sensitivity to light, unusual weakness or fatigue, dizziness, fast or irregular heartbeat, increased sensitivity to cold or heat, excessive sweating, constipation, hair loss, increased thirst or amount of urine, tremors or shaking, irritability Infusion reactions--chest pain, shortness of breath or trouble breathing, feeling faint or lightheaded Kidney injury (glomerulonephritis)--decrease in the amount of urine, red or dark brown urine, foamy or bubbly urine, swelling of the ankles, hands, or feet Liver injury--right upper belly pain, loss of appetite, nausea, light-colored stool, dark yellow or brown urine, yellowing skin or eyes, unusual weakness or fatigue Pain, tingling, or numbness in the hands or feet, muscle weakness, change in vision, confusion or trouble speaking, loss of balance or coordination, trouble walking, seizures Rash, fever, and swollen lymph nodes Redness, blistering, peeling, or loosening of the skin, including inside the mouth Sudden or severe stomach pain, bloody diarrhea, fever, nausea, vomiting Side effects that usually do not require medical attention (report to your care team if they continue or are bothersome): Bone, joint, or muscle pain Diarrhea Fatigue Loss of appetite Nausea Skin rash This list may not describe all possible side effects. Call your doctor for medical advice about side  effects. You may report side effects to FDA at 1-800-FDA-1088. Where should I keep my medication? This medication is given in a hospital or clinic. It will not be stored at home. NOTE: This sheet is a summary. It may not cover all possible information. If you have questions about this medicine, talk to your doctor, pharmacist, or health care provider.  2024 Elsevier/Gold Standard (2021-07-23 00:00:00)

## 2024-01-26 ENCOUNTER — Telehealth: Payer: Self-pay

## 2024-01-26 NOTE — Telephone Encounter (Signed)
 1st chemo callback:  [facility] checking on her after her first treatment yesterday. Asked that she call the office with any concerns.

## 2024-02-07 ENCOUNTER — Telehealth: Payer: Self-pay | Admitting: *Deleted

## 2024-02-07 DIAGNOSIS — F331 Major depressive disorder, recurrent, moderate: Secondary | ICD-10-CM | POA: Diagnosis not present

## 2024-02-07 DIAGNOSIS — F4310 Post-traumatic stress disorder, unspecified: Secondary | ICD-10-CM | POA: Diagnosis not present

## 2024-02-07 NOTE — Telephone Encounter (Signed)
 Katherine Martin called to ask what lotion to use for her neck that has started itching. Slightly red. Confirmed this is only area affected and is slightly better today. Suggested she try Sarna anti-itch or CeraVe anti-itch lotion. Call if itching/rash spreads. This is a common side effect of Yervoy .

## 2024-02-08 ENCOUNTER — Inpatient Hospital Stay: Admitting: Nurse Practitioner

## 2024-02-08 ENCOUNTER — Inpatient Hospital Stay

## 2024-02-14 ENCOUNTER — Inpatient Hospital Stay

## 2024-02-14 ENCOUNTER — Inpatient Hospital Stay: Admitting: Nurse Practitioner

## 2024-02-17 ENCOUNTER — Other Ambulatory Visit: Payer: Self-pay | Admitting: Oncology

## 2024-02-17 DIAGNOSIS — C439 Malignant melanoma of skin, unspecified: Secondary | ICD-10-CM

## 2024-02-22 ENCOUNTER — Inpatient Hospital Stay: Admitting: Nurse Practitioner

## 2024-02-22 ENCOUNTER — Inpatient Hospital Stay

## 2024-02-22 ENCOUNTER — Inpatient Hospital Stay: Attending: Nurse Practitioner

## 2024-02-22 ENCOUNTER — Encounter: Payer: Self-pay | Admitting: Nurse Practitioner

## 2024-02-22 VITALS — BP 136/67 | HR 96 | Temp 97.8°F | Resp 18 | Ht 61.0 in | Wt 140.3 lb

## 2024-02-22 VITALS — BP 128/47 | HR 86 | Temp 98.0°F | Resp 16

## 2024-02-22 DIAGNOSIS — C4371 Malignant melanoma of right lower limb, including hip: Secondary | ICD-10-CM | POA: Diagnosis not present

## 2024-02-22 DIAGNOSIS — Z79620 Long term (current) use of immunosuppressive biologic: Secondary | ICD-10-CM | POA: Diagnosis not present

## 2024-02-22 DIAGNOSIS — C7951 Secondary malignant neoplasm of bone: Secondary | ICD-10-CM | POA: Diagnosis not present

## 2024-02-22 DIAGNOSIS — C439 Malignant melanoma of skin, unspecified: Secondary | ICD-10-CM

## 2024-02-22 DIAGNOSIS — Z5112 Encounter for antineoplastic immunotherapy: Secondary | ICD-10-CM | POA: Insufficient documentation

## 2024-02-22 LAB — CBC WITH DIFFERENTIAL (CANCER CENTER ONLY)
Abs Immature Granulocytes: 0.08 K/uL — ABNORMAL HIGH (ref 0.00–0.07)
Basophils Absolute: 0.1 K/uL (ref 0.0–0.1)
Basophils Relative: 0 %
Eosinophils Absolute: 1.5 K/uL — ABNORMAL HIGH (ref 0.0–0.5)
Eosinophils Relative: 13 %
HCT: 34.4 % — ABNORMAL LOW (ref 36.0–46.0)
Hemoglobin: 11.2 g/dL — ABNORMAL LOW (ref 12.0–15.0)
Immature Granulocytes: 1 %
Lymphocytes Relative: 11 %
Lymphs Abs: 1.4 K/uL (ref 0.7–4.0)
MCH: 29.5 pg (ref 26.0–34.0)
MCHC: 32.6 g/dL (ref 30.0–36.0)
MCV: 90.5 fL (ref 80.0–100.0)
Monocytes Absolute: 0.8 K/uL (ref 0.1–1.0)
Monocytes Relative: 6 %
Neutro Abs: 8.2 K/uL — ABNORMAL HIGH (ref 1.7–7.7)
Neutrophils Relative %: 69 %
Platelet Count: 447 K/uL — ABNORMAL HIGH (ref 150–400)
RBC: 3.8 MIL/uL — ABNORMAL LOW (ref 3.87–5.11)
RDW: 14.9 % (ref 11.5–15.5)
WBC Count: 12 K/uL — ABNORMAL HIGH (ref 4.0–10.5)
nRBC: 0 % (ref 0.0–0.2)

## 2024-02-22 LAB — CMP (CANCER CENTER ONLY)
ALT: 6 U/L (ref 0–44)
AST: 16 U/L (ref 15–41)
Albumin: 3.9 g/dL (ref 3.5–5.0)
Alkaline Phosphatase: 92 U/L (ref 38–126)
Anion gap: 14 (ref 5–15)
BUN: 11 mg/dL (ref 8–23)
CO2: 24 mmol/L (ref 22–32)
Calcium: 10.1 mg/dL (ref 8.9–10.3)
Chloride: 95 mmol/L — ABNORMAL LOW (ref 98–111)
Creatinine: 0.99 mg/dL (ref 0.44–1.00)
GFR, Estimated: 59 mL/min — ABNORMAL LOW (ref >60)
Glucose, Bld: 118 mg/dL — ABNORMAL HIGH (ref 70–99)
Potassium: 4.2 mmol/L (ref 3.5–5.1)
Sodium: 133 mmol/L — ABNORMAL LOW (ref 135–145)
Total Bilirubin: <0.2 mg/dL (ref 0.0–1.2)
Total Protein: 8.5 g/dL — ABNORMAL HIGH (ref 6.5–8.1)

## 2024-02-22 MED ORDER — SODIUM CHLORIDE 0.9 % IV SOLN: INTRAVENOUS | Status: DC

## 2024-02-22 MED ORDER — FAMOTIDINE IN NACL 20-0.9 MG/50ML-% IV SOLN
20.0000 mg | Freq: Once | INTRAVENOUS | Status: AC
  Administered 2024-02-22: 20 mg via INTRAVENOUS
  Filled 2024-02-22: qty 50

## 2024-02-22 MED ORDER — SODIUM CHLORIDE 0.9 % IV SOLN
80.0000 mg | Freq: Once | INTRAVENOUS | Status: AC
  Administered 2024-02-22: 80 mg via INTRAVENOUS
  Filled 2024-02-22: qty 16

## 2024-02-22 MED ORDER — SODIUM CHLORIDE 0.9 % IV SOLN
240.0000 mg | Freq: Once | INTRAVENOUS | Status: AC
  Administered 2024-02-22: 240 mg via INTRAVENOUS
  Filled 2024-02-22: qty 24

## 2024-02-22 MED ORDER — DIPHENHYDRAMINE HCL 50 MG/ML IJ SOLN
25.0000 mg | Freq: Once | INTRAMUSCULAR | Status: AC
  Administered 2024-02-22: 25 mg via INTRAVENOUS
  Filled 2024-02-22: qty 1

## 2024-02-22 NOTE — Progress Notes (Signed)
 Ipilimumab  (YERVOY ) Patient Monitoring Assessment   Is the patient experiencing any of the following general symptoms?:  [ No]Difficulty performing normal activities [ No]Feeling sluggish or cold all the time [ No]Unusual weight gain [ No]Constant or unusual headaches [No]Feeling dizzy or faint [No ]Changes in eyesight (blurry vision, double vision, or other vision problems) [No]Changes in mood or behavior (ex: decreased sex drive, irritability, or forgetfulness) [ No]Starting new medications (ex: steroids, other medications that lower immune response) [ No]Patient is not experiencing any of the general symptoms above.   Gastrointestinal  Patient is having ! bowel movements every other day.  Is this different from baseline? No Are your stools watery or do they have a foul smell? No Have you seen blood in your stools? No Are your stools dark, tarry, or sticky? No Are you having pain or tenderness in your belly? No  Skin Does your skin itch? Yes Do you have a rash? Yes Has your skin blistered and/or peeled? No Do you have sores in your mouth? No  Hepatic Has your urine been dark or tea colored? No Have you noticed that your skin or the whites of your eyes are turning yellow? No Are you bleeding or bruising more easily than normal? Yes Are you nauseous and/or vomiting? No Do you have pain on the right side of your stomach? No  Neurologic  Are you having unusual weakness of legs, arms, or face? No Are you having numbness or tingling in your hands or feet? Yes- fingers intermittently-none at this time.   Katherine Martin

## 2024-02-22 NOTE — Progress Notes (Signed)
 [facility] OFFICE PROGRESS NOTE   Diagnosis: Melanoma  INTERVAL HISTORY:   Katherine Martin returns for follow-up.  She completed cycle 1 ipilimumab /nivolumab  on 01/25/2024.  She developed pruritus and a mild rash at the upper chest.  She has been applying Benadryl  cream with improvement.  No rash elsewhere.  No diarrhea.  No nausea or vomiting.  The thigh mass to her appears unchanged, continues to weep.  Objective:  Vital signs in last 24 hours:  Blood pressure 136/67, pulse 96, temperature 97.8 F (36.6 C), temperature source Temporal, resp. rate 18, height 5' 1 (1.549 m), weight 140 lb 4.8 oz (63.6 kg), SpO2 100%.    HEENT: No thrush or ulcers. Lymphatics: Approximate 4 cm right femoral node and 1 cm inguinal node. Resp: Lungs clear bilaterally. Cardio: Regular rate and rhythm. GI: No hepatosplenomegaly. Vascular: No leg edema. Skin: A few small erythematous skin lesions, some scabbed, upper anterior chest.  Large exophytic mass right anterior thigh with surrounding erythema/induration.  May be softer.   Lab Results:  Lab Results  Component Value Date   WBC 12.0 (H) 02/22/2024   HGB 11.2 (L) 02/22/2024   HCT 34.4 (L) 02/22/2024   MCV 90.5 02/22/2024   PLT 447 (H) 02/22/2024   NEUTROABS 8.2 (H) 02/22/2024    Imaging:  No results found.  Medications: I have reviewed the patient's current medications.  Assessment/Plan: Malignant melanoma right anterior thigh, stage III (pT3B cN3b,cM0) -Present for approximately 1 year, started seeing derm Dr. Dietrich 09/2023 -11/16/2023 Shave biopsy of the right anterior distal thigh 11/16/2023 + melanoma Breslow thickness at least 2.1 mm with ulceration, peripheral and deep margins involved, no LVI or PNI this was staged pT3b NX MX.  -IHC stains: panmel (+) SOX10 (+) CKAE1/3 (focally +) p63 (focally +) and CD10 (focally + in stroma), BRAF negative -12/15/2023 Seen by [facility] surgeon Dr. Feliciana who recommended CT  CAP/brain MRI and referred to med onc for immunotherapy with a plan to see her back in 3 months for surgical evaluation. Pt requested care closer to home -Exam reveals mobile right distal thigh mass, clear drainage with surrounding erythema and palpable R inguinal/femoral nodes - 01/05/2024 PET: FDG avid right lower thigh mass, multiple metastatic right inguinal, right external iliac, and right common iliac nodes, FDG avid nondisplaced left greater trochanter fracture - 01/06/2024 MRI brain: No evidence of metastatic disease - Cycle 1 ipilimumab /nivolumab  01/25/2024 -Cycle 2 ipilimumab /nivolumab  02/22/2024   2. Greater trochanter left hip fracture 11/23/2023 [place] at home while maneuvering a recliner  -Ortho consult recommends nonoperative treatment with weightbearing as tolerated, follow-up with repeat imaging, PT/OT -Ambulating well with walker, bearing weight, no pain   3. CAD, PAD -Cath 2007 RCA 100%, severe distal circumflex -On aspirin    4. HTN -On Diovan   5. HL   6. DM -On Actos  -Admission BG's 98 - 120's recently   7. Depression -On Paxil  -Well-controlled    8. Hypothyroidism -On Synthroid    9. Tobacco use -1.5 ppd x50 years -Encouraged to quit, offered assistance -Not O2 dependent     Disposition: Katherine Martin appears stable.  She has completed 1 cycle of ipilimumab /nivolumab .  Overall she tolerated well.  Plan to proceed with cycle 2 today as scheduled.  Restaging PET scan prior to next office visit.  She has a mild pruritic rash at the upper anterior chest, no rash elsewhere.  She will continue supportive care.  She understands to contact the office if the rash increases.  CBC and  chemistry panel reviewed.  Labs adequate for treatment.  She will return for follow-up and the next cycle of ipilimumab /nivolumab  at a 4-week rather than 3-week interval due to transportation issues.  Patient seen with Dr. Cloretta.  Olam Ned ANP/GNP-BC   02/22/2024  9:49  AM  This was a shared visit with Olam Ned.  Katherine Martin was interviewed and examined.  She has completed 1 cycle of ipilimumab /nivolumab .  There is a persistent right anterior thigh mass and a firm right femoral/inguinal lymph nodes.  The dominant mass appears softer and the lymph nodes appear slightly smaller.  She will complete another cycle of ipilimumab /nivolumab  today.  She will undergo a restaging PET evaluation prior to an office visit in 3-4 weeks.  Arvella Cloretta, MD

## 2024-02-22 NOTE — Progress Notes (Signed)
 Patient seen by Olam Ned NP today  Vitals are within treatment parameters:Yes   Labs are within treatment parameters: Yes   Treatment plan has been signed: Yes   Per physician team, Patient is ready for treatment and there are NO modifications to the treatment plan.

## 2024-02-22 NOTE — Patient Instructions (Signed)
 [facility] - A DEPT OF [facility]  Discharge Instructions: Thank you for choosing [facility] to provide your oncology and hematology care.   If you have a lab appointment with the Cancer Center, please go directly to the Cancer Center and check in at the registration area.   Wear comfortable clothing and clothing appropriate for easy access to any Portacath or PICC line.   We strive to give you quality time with your provider. You may need to reschedule your appointment if you arrive late (15 or more minutes).  Arriving late affects you and other patients whose appointments are after yours.  Also, if you miss three or more appointments without notifying the office, you may be dismissed from the clinic at the provider's discretion.      For prescription refill requests, have your pharmacy contact our office and allow 72 hours for refills to be completed.    Today you received the following chemotherapy and/or immunotherapy agents: Opdivo, Yervoy.     To help prevent nausea and vomiting after your treatment, we encourage you to take your nausea medication as directed.  BELOW ARE SYMPTOMS THAT SHOULD BE REPORTED IMMEDIATELY: *FEVER GREATER THAN 100.4 F (38 C) OR HIGHER *CHILLS OR SWEATING *NAUSEA AND VOMITING THAT IS NOT CONTROLLED WITH YOUR NAUSEA MEDICATION *UNUSUAL SHORTNESS OF BREATH *UNUSUAL BRUISING OR BLEEDING *URINARY PROBLEMS (pain or burning when urinating, or frequent urination) *BOWEL PROBLEMS (unusual diarrhea, constipation, pain near the anus) TENDERNESS IN MOUTH AND THROAT WITH OR WITHOUT PRESENCE OF ULCERS (sore throat, sores in mouth, or a toothache) UNUSUAL RASH, SWELLING OR PAIN  UNUSUAL VAGINAL DISCHARGE OR ITCHING   Items with * indicate a potential emergency and should be followed up as soon as possible or go to the Emergency Department if any problems should occur.  Please show the CHEMOTHERAPY ALERT CARD or  IMMUNOTHERAPY ALERT CARD at check-in to the Emergency Department and triage nurse.  Should you have questions after your visit or need to cancel or reschedule your appointment, please contact [facility] - A DEPT OF [facility]  Dept: [phone]  and follow the prompts.  Office hours are 8:00 a.m. to 4:30 p.m. Monday - Friday. Please note that voicemails left after 4:00 p.m. may not be returned until the following business day.  We are closed weekends and major holidays. You have access to a nurse at all times for urgent questions. Please call the main number to the clinic Dept: [phone] and follow the prompts.   For any non-urgent questions, you may also contact your provider using MyChart. We now offer e-Visits for anyone 24 and older to request care online for non-urgent symptoms. For details visit [email or web address].   Also download the MyChart app! Go to the app store, search MyChart, open the app, select [facility], and log in with your MyChart username and password.  _______________________________________________  Nivolumab Injection What is this medication? NIVOLUMAB (nye VOL ue mab) treats some types of cancer. It works by helping your immune system slow or stop the spread of cancer cells. It is a monoclonal antibody. This medicine may be used for other purposes; ask your health care provider or pharmacist if you have questions. COMMON BRAND NAME(S): Opdivo What should I tell my care team before I take this medication? They need to know if you have any of these conditions: Allogeneic stem cell transplant (uses someone else's stem cells) Autoimmune diseases, such  as Crohn disease, ulcerative colitis, lupus History of chest radiation Nervous system problems, such as Guillain-Barre syndrome or myasthenia gravis Organ transplant An unusual or allergic reaction to nivolumab, other medications, foods, dyes, or preservatives Pregnant or trying  to get pregnant Breast-feeding How should I use this medication? This medication is infused into a vein. It is given in a hospital or clinic setting. A special MedGuide will be given to you before each treatment. Be sure to read this information carefully each time. Talk to your care team about the use of this medication in children. While it may be prescribed for children as young as 12 years for selected conditions, precautions do apply. Overdosage: If you think you have taken too much of this medicine contact a poison control center or emergency room at once. NOTE: This medicine is only for you. Do not share this medicine with others. What if I miss a dose? Keep appointments for follow-up doses. It is important not to miss your dose. Call your care team if you are unable to keep an appointment. What may interact with this medication? Interactions have not been studied. This list may not describe all possible interactions. Give your health care provider a list of all the medicines, herbs, non-prescription drugs, or dietary supplements you use. Also tell them if you smoke, drink alcohol, or use illegal drugs. Some items may interact with your medicine. What should I watch for while using this medication? Your condition will be monitored carefully while you are receiving this medication. You may need blood work while taking this medication. This medication may cause serious skin reactions. They can happen weeks to months after starting the medication. Contact your care team right away if you notice fevers or flu-like symptoms with a rash. The rash may be red or purple and then turn into blisters or peeling of the skin. You may also notice a red rash with swelling of the face, lips, or lymph nodes in your neck or under your arms. Tell your care team right away if you have any change in your eyesight. Talk to your care team if you are pregnant or think you might be pregnant. A negative pregnancy test  is required before starting this medication. A reliable form of contraception is recommended while taking this medication and for 5 months after the last dose. Talk to your care team about effective forms of contraception. Do not breast-feed while taking this medication and for 5 months after the last dose. What side effects may I notice from receiving this medication? Side effects that you should report to your care team as soon as possible: Allergic reactions--skin rash, itching, hives, swelling of the face, lips, tongue, or throat Dry cough, shortness of breath or trouble breathing Eye pain, redness, irritation, or discharge with blurry or decreased vision Heart muscle inflammation--unusual weakness or fatigue, shortness of breath, chest pain, fast or irregular heartbeat, dizziness, swelling of the ankles, feet, or hands Hormone gland problems--headache, sensitivity to light, unusual weakness or fatigue, dizziness, fast or irregular heartbeat, increased sensitivity to cold or heat, excessive sweating, constipation, hair loss, increased thirst or amount of urine, tremors or shaking, irritability Infusion reactions--chest pain, shortness of breath or trouble breathing, feeling faint or lightheaded Kidney injury (glomerulonephritis)--decrease in the amount of urine, red or dark brown urine, foamy or bubbly urine, swelling of the ankles, hands, or feet Liver injury--right upper belly pain, loss of appetite, nausea, light-colored stool, dark yellow or brown urine, yellowing skin or eyes, unusual  weakness or fatigue Pain, tingling, or numbness in the hands or feet, muscle weakness, change in vision, confusion or trouble speaking, loss of balance or coordination, trouble walking, seizures Rash, fever, and swollen lymph nodes Redness, blistering, peeling, or loosening of the skin, including inside the mouth Sudden or severe stomach pain, bloody diarrhea, fever, nausea, vomiting Side effects that usually  do not require medical attention (report these to your care team if they continue or are bothersome): Bone, joint, or muscle pain Diarrhea Fatigue Loss of appetite Nausea Skin rash This list may not describe all possible side effects. Call your doctor for medical advice about side effects. You may report side effects to FDA at 1-800-FDA-1088. Where should I keep my medication? This medication is given in a hospital or clinic. It will not be stored at home. NOTE: This sheet is a summary. It may not cover all possible information. If you have questions about this medicine, talk to your doctor, pharmacist, or health care provider.  2024 Elsevier/Gold Standard (2021-07-05 00:00:00) _________________________________________  Ipilimumab Injection What is this medication? IPILIMUMAB (IP i LIM ue mab) treats some types of cancer. It works by helping your immune system slow or stop the spread of cancer cells. It is a monoclonal antibody. This medicine may be used for other purposes; ask your health care provider or pharmacist if you have questions. COMMON BRAND NAME(S): YERVOY What should I tell my care team before I take this medication? They need to know if you have any of these conditions: Allogeneic stem cell transplant (uses someone else's stem cells) Autoimmune diseases, such as Crohn disease, ulcerative colitis, lupus Nervous system problems, such as Guillain-Barre syndrome or myasthenia gravis Organ transplant An unusual or allergic reaction to ipilimumab, other medications, foods, dyes, or preservatives Pregnant or trying to get pregnant Breast-feeding How should I use this medication? This medication is infused into a vein. It is given by your care team in a hospital or clinic setting. A special MedGuide will be given to you before each treatment. Be sure to read this information carefully each time. Talk to your care team about the use of this medication in children. While it may be  prescribed for children as young as 12 years for selected conditions, precautions do apply. Overdosage: If you think you have taken too much of this medicine contact a poison control center or emergency room at once. NOTE: This medicine is only for you. Do not share this medicine with others. What if I miss a dose? Keep appointments for follow-up doses. It is important not to miss your dose. Call your care team if you are unable to keep an appointment. What may interact with this medication? Interactions are not expected. This list may not describe all possible interactions. Give your health care provider a list of all the medicines, herbs, non-prescription drugs, or dietary supplements you use. Also tell them if you smoke, drink alcohol, or use illegal drugs. Some items may interact with your medicine. What should I watch for while using this medication? Your condition will be monitored carefully while you are receiving this medication. You may need blood work while taking this medication. This medication may cause serious skin reactions. They can happen weeks to months after starting the medication. Contact your care team right away if you notice fevers or flu-like symptoms with a rash. The rash may be red or purple and then turn into blisters or peeling of the skin. You may also notice a red rash with  swelling of the face, lips, or lymph nodes in your neck or under your arms. Tell your care team right away if you have any change in your eyesight. Talk to your care team if you may be pregnant. Serious birth defects can occur if you take this medication during pregnancy and for 3 months after the last dose. You will need a negative pregnancy test before starting this medication. Contraception is recommended while taking this medication and for 3 months after the last dose. Your care team can help you find the option that works for you. Do not breastfeed while taking this medication and for 3 months  after the last dose. What side effects may I notice from receiving this medication? Side effects that you should report to your care team as soon as possible: Allergic reactions--skin rash, itching, hives, swelling of the face, lips, tongue, or throat Dry cough, shortness of breath or trouble breathing Eye pain, redness, irritation, or discharge with blurry or decreased vision Heart muscle inflammation--unusual weakness or fatigue, shortness of breath, chest pain, fast or irregular heartbeat, dizziness, swelling of the ankles, feet, or hands Hormone gland problems--headache, sensitivity to light, unusual weakness or fatigue, dizziness, fast or irregular heartbeat, increased sensitivity to cold or heat, excessive sweating, constipation, hair loss, increased thirst or amount of urine, tremors or shaking, irritability Infusion reactions--chest pain, shortness of breath or trouble breathing, feeling faint or lightheaded Kidney injury (glomerulonephritis)--decrease in the amount of urine, red or dark brown urine, foamy or bubbly urine, swelling of the ankles, hands, or feet Liver injury--right upper belly pain, loss of appetite, nausea, light-colored stool, dark yellow or brown urine, yellowing skin or eyes, unusual weakness or fatigue Pain, tingling, or numbness in the hands or feet, muscle weakness, change in vision, confusion or trouble speaking, loss of balance or coordination, trouble walking, seizures Rash, fever, and swollen lymph nodes Redness, blistering, peeling, or loosening of the skin, including inside the mouth Sudden or severe stomach pain, bloody diarrhea, fever, nausea, vomiting Side effects that usually do not require medical attention (report to your care team if they continue or are bothersome): Bone, joint, or muscle pain Diarrhea Fatigue Loss of appetite Nausea Skin rash This list may not describe all possible side effects. Call your doctor for medical advice about side  effects. You may report side effects to FDA at 1-800-FDA-1088. Where should I keep my medication? This medication is given in a hospital or clinic. It will not be stored at home. NOTE: This sheet is a summary. It may not cover all possible information. If you have questions about this medicine, talk to your doctor, pharmacist, or health care provider.  2024 Elsevier/Gold Standard (2021-07-23 00:00:00)

## 2024-03-13 ENCOUNTER — Encounter (HOSPITAL_COMMUNITY): Payer: Self-pay

## 2024-03-13 ENCOUNTER — Other Ambulatory Visit: Payer: Self-pay | Admitting: Nurse Practitioner

## 2024-03-13 DIAGNOSIS — C439 Malignant melanoma of skin, unspecified: Secondary | ICD-10-CM

## 2024-03-13 DIAGNOSIS — C4371 Malignant melanoma of right lower limb, including hip: Secondary | ICD-10-CM

## 2024-03-15 ENCOUNTER — Ambulatory Visit (HOSPITAL_COMMUNITY)
Admission: RE | Admit: 2024-03-15 | Discharge: 2024-03-15 | Disposition: A | Discharge status: Home / Self Care | Source: Ambulatory Visit | Attending: Nurse Practitioner | Admitting: Nurse Practitioner

## 2024-03-15 DIAGNOSIS — C439 Malignant melanoma of skin, unspecified: Secondary | ICD-10-CM | POA: Insufficient documentation

## 2024-03-15 DIAGNOSIS — C4371 Malignant melanoma of right lower limb, including hip: Secondary | ICD-10-CM | POA: Diagnosis present

## 2024-03-15 LAB — GLUCOSE, CAPILLARY: Glucose-Capillary: 106 mg/dL — ABNORMAL HIGH (ref 70–99)

## 2024-03-15 MED ORDER — FLUDEOXYGLUCOSE F - 18 (FDG) INJECTION
6.0000 | Freq: Once | INTRAVENOUS | Status: AC
  Administered 2024-03-15: 6.9 via INTRAVENOUS

## 2024-03-17 ENCOUNTER — Other Ambulatory Visit: Payer: Self-pay | Admitting: Oncology

## 2024-03-17 DIAGNOSIS — C439 Malignant melanoma of skin, unspecified: Secondary | ICD-10-CM

## 2024-03-22 ENCOUNTER — Encounter: Payer: Self-pay | Admitting: Nurse Practitioner

## 2024-03-22 ENCOUNTER — Inpatient Hospital Stay: Attending: Nurse Practitioner | Admitting: Nurse Practitioner

## 2024-03-22 ENCOUNTER — Inpatient Hospital Stay

## 2024-03-22 VITALS — BP 150/60 | HR 74 | Temp 97.8°F | Resp 18 | Ht 61.0 in | Wt 140.5 lb

## 2024-03-22 DIAGNOSIS — E039 Hypothyroidism, unspecified: Secondary | ICD-10-CM | POA: Insufficient documentation

## 2024-03-22 DIAGNOSIS — C7951 Secondary malignant neoplasm of bone: Secondary | ICD-10-CM | POA: Insufficient documentation

## 2024-03-22 DIAGNOSIS — Z720 Tobacco use: Secondary | ICD-10-CM | POA: Diagnosis not present

## 2024-03-22 DIAGNOSIS — Z79890 Hormone replacement therapy (postmenopausal): Secondary | ICD-10-CM | POA: Insufficient documentation

## 2024-03-22 DIAGNOSIS — C4371 Malignant melanoma of right lower limb, including hip: Secondary | ICD-10-CM | POA: Insufficient documentation

## 2024-03-22 DIAGNOSIS — C439 Malignant melanoma of skin, unspecified: Secondary | ICD-10-CM

## 2024-03-22 LAB — CMP (CANCER CENTER ONLY)
ALT: <5 U/L (ref 0–44)
AST: 15 U/L (ref 15–41)
Albumin: 3.8 g/dL (ref 3.5–5.0)
Alkaline Phosphatase: 101 U/L (ref 38–126)
Anion gap: 13 (ref 5–15)
BUN: 16 mg/dL (ref 8–23)
CO2: 23 mmol/L (ref 22–32)
Calcium: 9.8 mg/dL (ref 8.9–10.3)
Chloride: 101 mmol/L (ref 98–111)
Creatinine: 0.95 mg/dL (ref 0.44–1.00)
GFR, Estimated: >60 mL/min
Glucose, Bld: 132 mg/dL — ABNORMAL HIGH (ref 70–99)
Potassium: 4.1 mmol/L (ref 3.5–5.1)
Sodium: 136 mmol/L (ref 135–145)
Total Bilirubin: 0.3 mg/dL (ref 0.0–1.2)
Total Protein: 7.6 g/dL (ref 6.5–8.1)

## 2024-03-22 LAB — CBC WITH DIFFERENTIAL (CANCER CENTER ONLY)
Abs Immature Granulocytes: 0.03 K/uL (ref 0.00–0.07)
Basophils Absolute: 0.1 K/uL (ref 0.0–0.1)
Basophils Relative: 1 %
Eosinophils Absolute: 0.8 K/uL — ABNORMAL HIGH (ref 0.0–0.5)
Eosinophils Relative: 11 %
HCT: 32.3 % — ABNORMAL LOW (ref 36.0–46.0)
Hemoglobin: 10.4 g/dL — ABNORMAL LOW (ref 12.0–15.0)
Immature Granulocytes: 0 %
Lymphocytes Relative: 18 %
Lymphs Abs: 1.3 K/uL (ref 0.7–4.0)
MCH: 29.5 pg (ref 26.0–34.0)
MCHC: 32.2 g/dL (ref 30.0–36.0)
MCV: 91.8 fL (ref 80.0–100.0)
Monocytes Absolute: 0.6 K/uL (ref 0.1–1.0)
Monocytes Relative: 8 %
Neutro Abs: 4.7 K/uL (ref 1.7–7.7)
Neutrophils Relative %: 62 %
Platelet Count: 287 K/uL (ref 150–400)
RBC: 3.52 MIL/uL — ABNORMAL LOW (ref 3.87–5.11)
RDW: 17.6 % — ABNORMAL HIGH (ref 11.5–15.5)
WBC Count: 7.4 K/uL (ref 4.0–10.5)
nRBC: 0 % (ref 0.0–0.2)

## 2024-03-22 LAB — TSH: TSH: 5.9 u[IU]/mL — ABNORMAL HIGH (ref 0.350–4.500)

## 2024-03-22 NOTE — Progress Notes (Signed)
 Per provider patient will not receive treatment today, infusion has been made aware.

## 2024-03-22 NOTE — Progress Notes (Signed)
 " [facility] OFFICE PROGRESS NOTE   Diagnosis: Melanoma  INTERVAL HISTORY:   Katherine Martin returns as scheduled.  She completed cycle 2 ipilimumab /nivolumab  02/22/2024.  No diarrhea.  She continues to have pruritus at the upper chest, now also notes at the upper back and upper arms.  She has a mild rash across her chest.  No mouth sores.  No sores on the hands or feet.  No nausea or vomiting.  The thigh mass is smaller.  Objective:  Vital signs in last 24 hours:  Blood pressure (!) 150/60, pulse 74, temperature 97.8 F (36.6 C), temperature source Temporal, resp. rate 18, height 5' 1 (1.549 m), weight 140 lb 8 oz (63.7 kg), SpO2 93%.    HEENT: No thrush or ulcers.  Subconjunctival hemorrhage medial right eye.  Left posterior tongue with a small raised lesion. Lymphatics: No definite right inguinal adenopathy.  Approximate 3 cm right femoral node, linear. Resp: Lungs clear bilaterally. Cardio: Regular rate and rhythm. GI: No hepatosplenomegaly. Vascular: No leg edema. Skin: Scattered erythematous skin lesions upper anterior chest.  Right medial upper back with superficial abrasions, pattern consistent with scratching.  The exophytic mass at the right anterior thigh is smaller, surrounding erythema/induration decreased.   Lab Results:  Lab Results  Component Value Date   WBC 7.4 03/22/2024   HGB 10.4 (L) 03/22/2024   HCT 32.3 (L) 03/22/2024   MCV 91.8 03/22/2024   PLT 287 03/22/2024   NEUTROABS 4.7 03/22/2024    Imaging:  No results found.  Medications: I have reviewed the patient's current medications.  Assessment/Plan: Malignant melanoma right anterior thigh, stage III (pT3B cN3b,cM0) -Present for approximately 1 year, started seeing derm Dr. Dietrich 09/2023 -11/16/2023 Shave biopsy of the right anterior distal thigh 11/16/2023 + melanoma Breslow thickness at least 2.1 mm with ulceration, peripheral and deep margins involved, no LVI or PNI this was staged  pT3b NX MX.  -IHC stains: panmel (+) SOX10 (+) CKAE1/3 (focally +) p63 (focally +) and CD10 (focally + in stroma), BRAF negative -12/15/2023 Seen by [facility] surgeon Dr. Feliciana who recommended CT CAP/brain MRI and referred to med onc for immunotherapy with a plan to see her back in 3 months for surgical evaluation. Pt requested care closer to home -Exam reveals mobile right distal thigh mass, clear drainage with surrounding erythema and palpable R inguinal/femoral nodes - 01/05/2024 PET: FDG avid right lower thigh mass, multiple metastatic right inguinal, right external iliac, and right common iliac nodes, FDG avid nondisplaced left greater trochanter fracture - 01/06/2024 MRI brain: No evidence of metastatic disease - Cycle 1 ipilimumab /nivolumab  01/25/2024 -Cycle 2 ipilimumab /nivolumab  02/22/2024 -PET scan 03/15/2024-decreased size and FDG avidity of the right inguinal lymph nodes and exophytic right lower thigh soft tissue lesion.  Small nonspecific focus of mild asymmetric FDG uptake at the left base of the tongue.   2. Greater trochanter left hip fracture 11/23/2023 [place] at home while maneuvering a recliner  -Ortho consult recommends nonoperative treatment with weightbearing as tolerated, follow-up with repeat imaging, PT/OT -Ambulating well with walker, bearing weight, no pain   3. CAD, PAD -Cath 2007 RCA 100%, severe distal circumflex -On aspirin    4. HTN -On Diovan   5. HL   6. DM -On Actos  -Admission BG's 98 - 120's recently   7. Depression -On Paxil  -Well-controlled    8. Hypothyroidism -On Synthroid    9. Tobacco use -1.5 ppd x50 years -Encouraged to quit, offered assistance -Not O2 dependent   Disposition: Ms. Alvarez  appears stable.  She has completed 2 cycles of ipilimumab /nivolumab .  Aside from a mild rash she is tolerating well.  Evidence of clinical and PET response.  We are holding treatment today and referring back to Dr. Aron to consider surgery.  We  discussed the small focus of uptake at the left base of the tongue.  On exam she has a small benign-appearing lesion.  We will continue to monitor.  She will try topical hydrocortisone for the rash/pruritus at the anterior chest.  She understands to contact the office if the rash worsens.  She will return for follow-up in approximately 2 weeks.  We are available to see her sooner if needed.  Patient seen with Dr. Cloretta.  Olam Ned ANP/GNP-BC   03/22/2024  9:43 AM  This was a shared visit with Olam Ned.  Ms. Denison was interviewed and examined.  We reviewed the PET findings with Ms. Bottoms and her daughter.  I reviewed the images.  There has been significant improvement in the right thigh mass and inguinal/femoral and pelvic lymphadenopathy.  She has completed 2 cycles of ipilimumab /nivolumab .  She has developed a pruritic rash.  Treatment will be placed on hold.  I will contact Dr. Aron to see whether she is a candidate for resection of the right thigh mass and lymph nodes.  We will recommend adjuvant therapy based on the pathologic response.  Arvella Cloretta, MD      "

## 2024-03-23 LAB — T4: T4, Total: 9.7 ug/dL (ref 4.5–12.0)

## 2024-03-24 ENCOUNTER — Other Ambulatory Visit: Payer: Self-pay

## 2024-03-28 ENCOUNTER — Ambulatory Visit (HOSPITAL_COMMUNITY)
Admission: RE | Admit: 2024-03-28 | Discharge: 2024-03-28 | Disposition: A | Discharge status: Home / Self Care | Source: Ambulatory Visit | Attending: Physician Assistant | Admitting: Physician Assistant

## 2024-03-28 ENCOUNTER — Ambulatory Visit (INDEPENDENT_AMBULATORY_CARE_PROVIDER_SITE_OTHER): Admitting: Physician Assistant

## 2024-03-28 VITALS — BP 167/69 | HR 68 | Temp 97.7°F | Wt 138.7 lb

## 2024-03-28 DIAGNOSIS — I6523 Occlusion and stenosis of bilateral carotid arteries: Secondary | ICD-10-CM | POA: Insufficient documentation

## 2024-03-28 NOTE — Progress Notes (Signed)
 " Office Note     CC:  follow up Requesting Provider:  Clarice Nottingham, MD  HPI: Katherine Martin is a 79 y.o. (1945-10-14) female who presents for surveillance of carotid artery stenosis.  She was last seen 6 months ago with a left ICA stenosis of 60 to 79% by duplex.  She denies any diagnosis of CVA or TIA since last office visit.  She also denies any neurological events including slurred speech, changes in vision, one-sided weakness.  She has no history of stroke or mini stroke.  She takes a daily aspirin  and statin.  She is an everyday smoker.   Past Medical History:  Diagnosis Date   Anxiety    Complication of anesthesia 1990s   had trouble waking me up   Coronary artery disease, occlusive    cath 2007  - RCA 100%, Severe distal Circumflex   Daily headache    last couple weeks (07/09/2015)   Depression    Dyslipidemia    Hypertension    Hypothyroidism    Obesity (BMI 30.0-34.9)    PONV (postoperative nausea and vomiting)    PVD (peripheral vascular disease)    LAA Dopplers, November 2013: ABIs--0.67 right, 0.65 left bilateral common iliacs less than 50% stenosis, bilateral SFA demonstrate exclusive disease with reconstitution in the proximal segment of bronchial arteries.) He artery occluded with 2 vessel runoff on the right, 3 vessel run off on left.   Tobacco use    Type 2 diabetes mellitus (HCC)     Past Surgical History:  Procedure Laterality Date   CARDIAC CATHETERIZATION  03/06/2006   100% RCA, Severe distal Circumflex stenosis, moderate LAD; EF ~60% with mild anterior hypokinesis. (Dr. DOROTHA Gent)   CHOLECYSTECTOMY OPEN     DILATION AND CURETTAGE OF UTERUS     Lower Extremity Arterial Doppler  01/2012   bilateral ABI demonstrate moderate arterial insuff at rest; abd aorta with non-hemodynamically significant plaque; bilat CIA with equal/less than 50% diameter reduction; bilat SFA with occlusive disease & reconstiution in prox popliteal; R peroneal not visualized    Lower Extremity Arterial Doppler  September 2014   RABI: 0.55; LABI 0.53; moderate CEA, bilateral SFA-occlusive with reconstitution and proximal Popliteal; bilateral Peroneal artery occlusion.   NM [facility] - Treadmill  02/2009   Walk 6 minutes. No ischemia or infarction.   TONSILLECTOMY      Social History   Socioeconomic History   Marital status: Widowed    Spouse name: Not on file   Number of children: 1   Years of education: 68   Highest education level: Not on file  Occupational History    Employer: OTHER  Tobacco Use   Smoking status: Every Day    Current packs/day: 1.00    Average packs/day: 1 pack/day for 50.0 years (50.0 ttl pk-yrs)    Types: Cigarettes   Smokeless tobacco: Never  Vaping Use   Vaping status: Former  Substance and Sexual Activity   Alcohol use: No   Drug use: No   Sexual activity: Never  Other Topics Concern   Not on file  Social History Narrative   She is a widowed mother of one. She continues to smoke a pack a day, as she has for about 40 years. She is trying to pick up her exercise level but does not really do much with exercise. She says she is basically lazy   Social Drivers of Health   Tobacco Use: High Risk (03/28/2024)   Patient History  Smoking Tobacco Use: Every Day    Smokeless Tobacco Use: Never    Passive Exposure: Not on file  Financial Resource Strain: Low Risk (01/16/2024)   Overall Financial Resource Strain (CARDIA)    Difficulty of Paying Living Expenses: Not hard at all  Food Insecurity: No Food Insecurity (01/16/2024)   Epic    Worried About Programme Researcher, Broadcasting/film/video in the Last Year: Never true    Ran Out of Food in the Last Year: Never true  Transportation Needs: No Transportation Needs (01/16/2024)   Epic    Lack of Transportation (Medical): No    Lack of Transportation (Non-Medical): No  Physical Activity: Not on file  Stress: Not on file  Social Connections: Socially Isolated (11/23/2023)   Social Connection and  Isolation Panel    Frequency of Communication with Friends and Family: More than three times a week    Frequency of Social Gatherings with Friends and Family: More than three times a week    Attends Religious Services: Never    Database Administrator or Organizations: No    Attends Banker Meetings: Never    Marital Status: Widowed  Intimate Partner Violence: Not At Risk (01/16/2024)   Epic    Fear of Current or Ex-Partner: No    Emotionally Abused: No    Physically Abused: No    Sexually Abused: No  Depression (PHQ2-9): Low Risk (03/22/2024)   Depression (PHQ2-9)    PHQ-2 Score: 0  Alcohol Screen: Not on file  Housing: Low Risk (01/16/2024)   Epic    Unable to Pay for Housing in the Last Year: No    Number of Times Moved in the Last Year: 0    Homeless in the Last Year: No  Utilities: Not At Risk (12/28/2023)   Epic    Threatened with loss of utilities: No  Health Literacy: Not on file    Family History  Problem Relation Age of Onset   Heart attack Father 23   Heart failure Paternal Grandmother    Heart attack Paternal Grandfather    Heart attack Brother        CABG   Heart attack Sister     Current Outpatient Medications  Medication Sig Dispense Refill   acetaminophen  (TYLENOL ) 325 MG tablet Take 2 tablets (650 mg total) by mouth every 6 (six) hours as needed for mild pain (pain score 1-3) or fever (or Fever >/= 101).     ACTOS  15 MG tablet Take 15 mg by mouth every morning.     aspirin  81 MG tablet Take 1 tablet (81 mg total) by mouth 2 (two) times daily with a meal. (Patient taking differently: Take 81 mg by mouth once. Reports taking 1 tablet PO QD) 30 tablet 1   calcium  carbonate (TUMS - DOSED IN MG ELEMENTAL CALCIUM ) 500 MG chewable tablet Chew 1 tablet by mouth daily as needed for indigestion or heartburn.     levothyroxine  (SYNTHROID ) 100 MCG tablet Take 100 mcg by mouth daily before breakfast.     PAXIL  CR 25 MG 24 hr tablet Take 50 mg by mouth every  evening.      valsartan (DIOVAN) 80 MG tablet Take 80 mg by mouth 2 (two) times daily.     cholecalciferol  (VITAMIN D3) 25 MCG (1000 UNIT) tablet Take 1,000 Units by mouth daily. (Patient taking differently: Take 750 Units by mouth every other day.)     LORazepam  (ATIVAN ) 1 MG tablet Take 1 mg by  mouth daily as needed for anxiety. (Patient not taking: Reported on 03/22/2024)     ondansetron  (ZOFRAN ) 4 MG tablet Take 1 tablet (4 mg total) by mouth every 6 (six) hours as needed for nausea. (Patient not taking: Reported on 03/22/2024) 20 tablet 0   No current facility-administered medications for this visit.    Allergies[1]   REVIEW OF SYSTEMS:  Negative unless noted in HPI [X]  denotes positive finding, [ ]  denotes negative finding Cardiac  Comments:  Chest pain or chest pressure:    Shortness of breath upon exertion:    Short of breath when lying flat:    Irregular heart rhythm:        Vascular    Pain in calf, thigh, or hip brought on by ambulation:    Pain in feet at night that wakes you up from your sleep:     Blood clot in your veins:    Leg swelling:         Pulmonary    Oxygen at home:    Productive cough:     Wheezing:         Neurologic    Sudden weakness in arms or legs:     Sudden numbness in arms or legs:     Sudden onset of difficulty speaking or slurred speech:    Temporary loss of vision in one eye:     Problems with dizziness:         Gastrointestinal    Blood in stool:     Vomited blood:         Genitourinary    Burning when urinating:     Blood in urine:        Psychiatric    Major depression:         Hematologic    Bleeding problems:    Problems with blood clotting too easily:        Skin    Rashes or ulcers:        Constitutional    Fever or chills:      PHYSICAL EXAMINATION:  Vitals:   03/28/24 1443 03/28/24 1446  BP: (!) 170/76 (!) 167/69  Pulse: 75 68  Temp: 97.7 F (36.5 C)   TempSrc: Temporal   Weight: 138 lb 11.2 oz (62.9 kg)      General:  WDWN in NAD; vital signs documented above Gait: Not observed HENT: WNL, normocephalic Pulmonary: normal non-labored breathing Cardiac: regular HR Abdomen: soft, NT, no masses Skin: without rashes Vascular Exam/Pulses: Symmetrical radial pulses Extremities: without ischemic changes, without Gangrene , without cellulitis; without open wounds;  Musculoskeletal: no muscle wasting or atrophy  Neurologic: A&O X 3; CN grossly intact Psychiatric:  The pt has Normal affect.   Non-Invasive Vascular Imaging:   R ICA 1-39% stenosis  L ICA 60-79% stenosis    ASSESSMENT/PLAN:: 79 y.o. female here for follow up for surveillance of carotid artery stenosis  Ms. Docter is a 79 year old female who returns for surveillance of carotid artery stenosis.  She has not experienced any strokelike symptoms since last office visit.  Duplex is stable demonstrating 60 to 79% stenosis of the left ICA.  Right ICA also stable at 1 to 39% stenosis.  No indication for revascularization of asymptomatic left ICA stenosis.  Recommend smoking cessation.  We will repeat carotid duplex in another 6 months.  She knows to seek immediate medical attention should she develop any strokelike symptoms.   Donnice Sender, PA-C [facility] [phone]  Clinic MD:   [place]     [1]  Allergies Allergen Reactions   Clindamycin/Lincomycin Anaphylaxis   Buspar [Buspirone]    Crestor  [Rosuvastatin  Calcium ] Other (See Comments)    Extreme weakness and vomiting    Demerol [Meperidine] Hives   Effexor [Venlafaxine]    Erythromycin Hives   Levoxyl  [Levothyroxine ]    Other Hives    peaches   Peach [Prunus Persica] Hives   Penicillins Hives    Did it involve swelling of the face/tongue/throat, SOB, or low BP? No Did it involve sudden or severe rash/hives, skin peeling, or any reaction on the inside of your mouth or nose? Yes Did you need to seek medical attention at a hospital or doctor's  office? Yes When did it last happen?      a long time ago If all above answers are NO, may proceed with cephalosporin use.    Prednisone Hives   Sular [Nisoldipine Er]    Zithromax [Azithromycin]    Zoloft [Sertraline Hcl]    "

## 2024-03-29 ENCOUNTER — Other Ambulatory Visit: Payer: Self-pay | Admitting: *Deleted

## 2024-03-29 ENCOUNTER — Other Ambulatory Visit: Payer: Self-pay

## 2024-03-29 DIAGNOSIS — I6523 Occlusion and stenosis of bilateral carotid arteries: Secondary | ICD-10-CM

## 2024-03-31 ENCOUNTER — Other Ambulatory Visit: Payer: Self-pay

## 2024-04-03 ENCOUNTER — Telehealth: Payer: Self-pay | Admitting: Nurse Practitioner

## 2024-04-04 ENCOUNTER — Inpatient Hospital Stay: Admitting: Nurse Practitioner

## 2024-04-04 ENCOUNTER — Encounter: Payer: Self-pay | Admitting: Nurse Practitioner

## 2024-04-04 VITALS — BP 144/70 | HR 90 | Temp 97.8°F | Resp 18 | Ht 61.0 in | Wt 139.4 lb

## 2024-04-04 DIAGNOSIS — C439 Malignant melanoma of skin, unspecified: Secondary | ICD-10-CM | POA: Diagnosis not present

## 2024-04-04 DIAGNOSIS — C4371 Malignant melanoma of right lower limb, including hip: Secondary | ICD-10-CM | POA: Diagnosis not present

## 2024-04-04 NOTE — Progress Notes (Signed)
 " [facility] OFFICE PROGRESS NOTE   Diagnosis: Melanoma  INTERVAL HISTORY:   Katherine Martin returns as scheduled.  Aside from fairly generalized pruritus she feels well.  Persistent mild rash upper chest.  No mouth sores.  No hand or foot pain or redness.  No blister type lesions.  She feels the right thigh mass is smaller.  Objective:  Vital signs in last 24 hours:  Blood pressure (!) 144/70, pulse 90, temperature 97.8 F (36.6 C), temperature source Temporal, resp. rate 18, height 5' 1 (1.549 m), weight 139 lb 6.4 oz (63.2 kg), SpO2 100%.    HEENT: No thrush or ulcers. Lymphatics: Approximate 3 cm linear right femoral lymph node. Resp: Lungs clear bilaterally. Cardio: Regular rate and rhythm. GI: No hepatosplenomegaly. Vascular: No leg edema. Neuro: Alert and oriented. Skin: Exophytic mass at the right anterior thigh is smaller, surrounding erythema/induration further improved.  Faintly erythematous skin lesions upper anterior chest.   Lab Results:  Lab Results  Component Value Date   WBC 7.4 03/22/2024   HGB 10.4 (L) 03/22/2024   HCT 32.3 (L) 03/22/2024   MCV 91.8 03/22/2024   PLT 287 03/22/2024   NEUTROABS 4.7 03/22/2024    Imaging:  No results found.  Medications: I have reviewed the patient's current medications.  Assessment/Plan: Malignant melanoma right anterior thigh, stage III (pT3B cN3b,cM0) -Present for approximately 1 year, started seeing derm Dr. Dietrich 09/2023 -11/16/2023 Shave biopsy of the right anterior distal thigh 11/16/2023 + melanoma Breslow thickness at least 2.1 mm with ulceration, peripheral and deep margins involved, no LVI or PNI this was staged pT3b NX MX.  -IHC stains: panmel (+) SOX10 (+) CKAE1/3 (focally +) p63 (focally +) and CD10 (focally + in stroma), BRAF negative -12/15/2023 Seen by [facility] surgeon Dr. Feliciana who recommended CT CAP/brain MRI and referred to med onc for immunotherapy with a plan to see her back in  3 months for surgical evaluation. Pt requested care closer to home -Exam reveals mobile right distal thigh mass, clear drainage with surrounding erythema and palpable R inguinal/femoral nodes - 01/05/2024 PET: FDG avid right lower thigh mass, multiple metastatic right inguinal, right external iliac, and right common iliac nodes, FDG avid nondisplaced left greater trochanter fracture - 01/06/2024 MRI brain: No evidence of metastatic disease - Cycle 1 ipilimumab /nivolumab  01/25/2024 -Cycle 2 ipilimumab /nivolumab  02/22/2024 -PET scan 03/15/2024-decreased size and FDG avidity of the right inguinal lymph nodes and exophytic right lower thigh soft tissue lesion.  Small nonspecific focus of mild asymmetric FDG uptake at the left base of the tongue.   2. Greater trochanter left hip fracture 11/23/2023 [place] at home while maneuvering a recliner  -Ortho consult recommends nonoperative treatment with weightbearing as tolerated, follow-up with repeat imaging, PT/OT -Ambulating well with walker, bearing weight, no pain   3. CAD, PAD -Cath 2007 RCA 100%, severe distal circumflex -On aspirin    4. HTN -On Diovan   5. HL   6. DM -On Actos  -Admission BG's 98 - 120's recently   7. Depression -On Paxil  -Well-controlled    8. Hypothyroidism -On Synthroid    9. Tobacco use -1.5 ppd x50 years -Encouraged to quit, offered assistance -Not O2 dependent   Disposition: Katherine Martin appears stable.  She has completed 2 cycles of ipilimumab /nivolumab .  Right thigh mass again appears smaller.  She is scheduled to see Dr. Aron 04/12/2024.  She continues to have generalized pruritus, mild skin rash at the anterior chest.  Symptoms likely related to immunotherapy.  Plan continue  supportive care.  She will return for follow-up in approximately 1 month.  We will adjust accordingly pending Dr. Dia recommendation.  Plan reviewed with Dr. Cloretta.  Olam Ned ANP/GNP-BC   04/04/2024  8:41  AM        "

## 2024-04-12 ENCOUNTER — Other Ambulatory Visit: Payer: Self-pay | Admitting: General Surgery

## 2024-04-19 ENCOUNTER — Telehealth: Payer: Self-pay | Admitting: *Deleted

## 2024-04-19 NOTE — Telephone Encounter (Signed)
 LVM and sent mychart message that 2/12 OV is being canceled since her biopsy is not until 05/14/24. Scheduling message sent for the reschedule.

## 2024-05-02 ENCOUNTER — Inpatient Hospital Stay: Admitting: Nurse Practitioner

## 2024-05-14 ENCOUNTER — Ambulatory Visit (HOSPITAL_BASED_OUTPATIENT_CLINIC_OR_DEPARTMENT_OTHER): Admit: 2024-05-14 | Admitting: General Surgery

## 2024-05-14 ENCOUNTER — Encounter (HOSPITAL_BASED_OUTPATIENT_CLINIC_OR_DEPARTMENT_OTHER): Payer: Self-pay

## 2024-05-28 ENCOUNTER — Inpatient Hospital Stay: Admitting: Oncology

## 2024-09-26 ENCOUNTER — Ambulatory Visit (HOSPITAL_COMMUNITY)

## 2024-09-26 ENCOUNTER — Ambulatory Visit
